# Patient Record
Sex: Female | Born: 1995 | Race: Black or African American | Hispanic: No | Marital: Single | Smoking: Never smoker
Health system: Southern US, Community
[De-identification: ages and names within clinical notes are randomized; demographics above are authoritative.]

## PROBLEM LIST (undated history)

## (undated) ENCOUNTER — Inpatient Hospital Stay (HOSPITAL_COMMUNITY): Payer: Self-pay

## (undated) DIAGNOSIS — R519 Headache, unspecified: Secondary | ICD-10-CM

## (undated) DIAGNOSIS — F419 Anxiety disorder, unspecified: Secondary | ICD-10-CM

## (undated) DIAGNOSIS — R87629 Unspecified abnormal cytological findings in specimens from vagina: Secondary | ICD-10-CM

## (undated) DIAGNOSIS — F32A Depression, unspecified: Secondary | ICD-10-CM

## (undated) DIAGNOSIS — A64 Unspecified sexually transmitted disease: Secondary | ICD-10-CM

## (undated) DIAGNOSIS — A749 Chlamydial infection, unspecified: Secondary | ICD-10-CM

## (undated) DIAGNOSIS — R7989 Other specified abnormal findings of blood chemistry: Secondary | ICD-10-CM

## (undated) DIAGNOSIS — K429 Umbilical hernia without obstruction or gangrene: Secondary | ICD-10-CM

## (undated) HISTORY — DX: Headache, unspecified: R51.9

## (undated) HISTORY — DX: Other specified abnormal findings of blood chemistry: R79.89

## (undated) HISTORY — PX: TONSILLECTOMY: SUR1361

## (undated) HISTORY — DX: Chlamydial infection, unspecified: A74.9

## (undated) HISTORY — DX: Unspecified sexually transmitted disease: A64

---
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
## 2013-02-21 ENCOUNTER — Other Ambulatory Visit: Payer: Self-pay | Admitting: Internal Medicine

## 2013-02-21 DIAGNOSIS — N6314 Unspecified lump in the right breast, lower inner quadrant: Secondary | ICD-10-CM

## 2013-02-23 ENCOUNTER — Ambulatory Visit
Admission: RE | Admit: 2013-02-23 | Discharge: 2013-02-23 | Disposition: A | Discharge status: Home / Self Care | Payer: Medicaid Other | Source: Ambulatory Visit | Attending: Internal Medicine | Admitting: Internal Medicine

## 2013-02-23 DIAGNOSIS — N6314 Unspecified lump in the right breast, lower inner quadrant: Secondary | ICD-10-CM

## 2013-12-16 ENCOUNTER — Emergency Department (HOSPITAL_COMMUNITY): Payer: Medicaid Other

## 2013-12-16 ENCOUNTER — Encounter (HOSPITAL_COMMUNITY): Payer: Self-pay | Admitting: Emergency Medicine

## 2013-12-16 ENCOUNTER — Emergency Department (HOSPITAL_COMMUNITY)
Admission: EM | Admit: 2013-12-16 | Discharge: 2013-12-17 | Disposition: A | Discharge status: Home / Self Care | Payer: Medicaid Other | Attending: Emergency Medicine | Admitting: Emergency Medicine

## 2013-12-16 DIAGNOSIS — Z791 Long term (current) use of non-steroidal anti-inflammatories (NSAID): Secondary | ICD-10-CM | POA: Diagnosis not present

## 2013-12-16 DIAGNOSIS — S0993XA Unspecified injury of face, initial encounter: Secondary | ICD-10-CM

## 2013-12-16 DIAGNOSIS — Y998 Other external cause status: Secondary | ICD-10-CM | POA: Diagnosis not present

## 2013-12-16 DIAGNOSIS — Y9289 Other specified places as the place of occurrence of the external cause: Secondary | ICD-10-CM | POA: Insufficient documentation

## 2013-12-16 DIAGNOSIS — S01511A Laceration without foreign body of lip, initial encounter: Secondary | ICD-10-CM | POA: Diagnosis not present

## 2013-12-16 DIAGNOSIS — Y9389 Activity, other specified: Secondary | ICD-10-CM | POA: Insufficient documentation

## 2013-12-16 DIAGNOSIS — Z23 Encounter for immunization: Secondary | ICD-10-CM | POA: Diagnosis not present

## 2013-12-16 DIAGNOSIS — Z8619 Personal history of other infectious and parasitic diseases: Secondary | ICD-10-CM | POA: Insufficient documentation

## 2013-12-16 DIAGNOSIS — Z792 Long term (current) use of antibiotics: Secondary | ICD-10-CM | POA: Insufficient documentation

## 2013-12-16 DIAGNOSIS — Z79899 Other long term (current) drug therapy: Secondary | ICD-10-CM | POA: Insufficient documentation

## 2013-12-16 MED ORDER — TETANUS-DIPHTH-ACELL PERTUSSIS 5-2.5-18.5 LF-MCG/0.5 IM SUSP
0.5000 mL | Freq: Once | INTRAMUSCULAR | Status: AC
  Administered 2013-12-16: 0.5 mL via INTRAMUSCULAR
  Filled 2013-12-16: qty 0.5

## 2013-12-16 MED ORDER — HYDROCODONE-ACETAMINOPHEN 5-325 MG PO TABS: 1.0000 | ORAL_TABLET | ORAL | Status: DC | PRN

## 2013-12-16 MED ORDER — PENICILLIN V POTASSIUM 500 MG PO TABS
500.0000 mg | ORAL_TABLET | Freq: Three times a day (TID) | ORAL | Status: DC
Start: 2013-12-16 — End: 2015-09-29

## 2013-12-16 MED ORDER — HYDROCODONE-ACETAMINOPHEN 5-325 MG PO TABS
1.0000 | ORAL_TABLET | Freq: Once | ORAL | Status: AC
  Administered 2013-12-16: 1 via ORAL
  Filled 2013-12-16: qty 1
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
## 2013-12-16 NOTE — Discharge Instructions (Signed)
Dental Injury  Your exam shows that you have injured your teeth. The treatment of broken teeth and other dental injuries depends on how badly they are hurt. All dental injuries should be checked as soon as possible by a dentist if there are:   Loose teeth which may need to be wired or bonded with a plastic device to hold them in place.   Broken teeth with exposed tooth pulp which may cause a serious infection.   Painful teeth especially when you bite or chew.   Sharp tooth edges that cut your tongue or lips.  Sometimes, antibiotics or pain medicine are prescribed to prevent infection and control pain. Eat a soft or liquid diet and rinse your mouth out after meals with warm water. You should see a dentist or return here at once if you have increased swelling, increased pain or uncontrolled bleeding from the site of your injury.  SEEK MEDICAL CARE IF:    You have increased pain not controlled with medicines.   You have swelling around your tooth, in your face or neck.   You have bleeding which starts, continues, or gets worse.   You have a fever.  Document Released: 12/28/2004 Document Revised: 03/22/2011 Document Reviewed: 12/27/2008  ExitCare Patient Information 2015 [employer]. This information is not intended to replace advice given to you by your health care provider. Make sure you discuss any questions you have with your health care provider.

## 2015-08-01 ENCOUNTER — Emergency Department (HOSPITAL_COMMUNITY)
Admission: EM | Admit: 2015-08-01 | Discharge: 2015-08-01 | Disposition: A | Discharge status: Home / Self Care | Payer: Medicaid Other | Attending: Emergency Medicine | Admitting: Emergency Medicine

## 2015-08-01 DIAGNOSIS — B3731 Acute candidiasis of vulva and vagina: Secondary | ICD-10-CM

## 2015-08-01 DIAGNOSIS — N760 Acute vaginitis: Secondary | ICD-10-CM | POA: Insufficient documentation

## 2015-08-01 DIAGNOSIS — B373 Candidiasis of vulva and vagina: Secondary | ICD-10-CM

## 2015-08-01 LAB — POC URINE PREG, ED: PREG TEST UR: NEGATIVE

## 2015-08-01 LAB — URINALYSIS, ROUTINE W REFLEX MICROSCOPIC
BILIRUBIN URINE: NEGATIVE
Glucose, UA: NEGATIVE mg/dL
HGB URINE DIPSTICK: NEGATIVE
KETONES UR: NEGATIVE mg/dL
NITRITE: NEGATIVE
PROTEIN: NEGATIVE mg/dL
SPECIFIC GRAVITY, URINE: 1.03 (ref 1.005–1.030)
pH: 6 (ref 5.0–8.0)

## 2015-08-01 LAB — WET PREP, GENITAL
CLUE CELLS WET PREP: NONE SEEN
Sperm: NONE SEEN
TRICH WET PREP: NONE SEEN

## 2015-08-01 LAB — URINE MICROSCOPIC-ADD ON

## 2015-08-01 LAB — GC/CHLAMYDIA PROBE AMP (~~LOC~~) NOT AT ARMC
CHLAMYDIA, DNA PROBE: NEGATIVE
Neisseria Gonorrhea: POSITIVE — AB

## 2015-08-01 MED ORDER — FLUCONAZOLE 100 MG PO TABS
150.0000 mg | ORAL_TABLET | Freq: Once | ORAL | Status: AC
  Administered 2015-08-01: 150 mg via ORAL
  Filled 2015-08-01: qty 2

## 2015-08-01 NOTE — ED Provider Notes (Signed)
TIME SEEN: 3:21 AM  CHIEF COMPLAINT: Vaginal Discharge  HPI: Jean Solis is a 20 y.o. female with PMHx of chlamydia who presents to the Emergency Department complaining of sudden onset, intermittent, copious, vaginal discharge beginning three days ago. She endorses associated vaginal burning and itching. Pt reports that she went to the health department three days ago and was treated for gonorrhea since her partner was recently diagnosed gonorrhea positive; she reports she had a urinalysis taken but not a pelvic exam. She also notes she had blood work taken and her results for HIV and syphilis are still pending. Pt was given azithromycin and a shot of Rocephin at the health department. Pt states she began experiencing vaginal discharge after the visit to the health department. Pt denies dysuria, hematuria, abdominal pain, nausea, vomiting, diarrhea, or any other associated symptoms. States she thinks she has a yeast infection.   ROS: See HPI Constitutional: no fever  Eyes: no drainage  ENT: no runny nose   Cardiovascular:  no chest pain  Resp: no SOB  GI: no nausea, no vomiting, no diarrhea, no abdominal pain GU: vaginal discharge, vaginal burning, vaginal itching, no dysuria, no hematuria Integumentary: no rash  Allergy: no hives  Musculoskeletal: no leg swelling  Neurological: no slurred speech ROS otherwise negative  PAST MEDICAL HISTORY/PAST SURGICAL HISTORY:  Past Medical History  Diagnosis Date  . Chlamydia 09/2011    MEDICATIONS:  Prior to Admission medications   Medication Sig Start Date End Date Taking? Authorizing Provider  Etonogestrel (NEXPLANON [place]) Inject 1 application into the skin once.  11/24/11   Historical Provider, MD  HYDROcodone-acetaminophen (NORCO/VICODIN) 5-325 MG per tablet Take 1-2 tablets by mouth every 4 (four) hours as needed. 12/16/13   Elpidio AnisShari Upstill, PA-C  penicillin v potassium (VEETID) 500 MG tablet Take 1 tablet (500 mg total) by mouth 3 (three)  times daily. 12/16/13   Elpidio AnisShari Upstill, PA-C    ALLERGIES:  No Known Allergies  SOCIAL HISTORY:  Social History  Substance Use Topics  . Smoking status: Never Smoker   . Smokeless tobacco: Not on file  . Alcohol Use: Yes    FAMILY HISTORY: Family History  Problem Relation Age of Onset  . Hypertension Mother   . Hyperlipidemia Mother   . Hyperlipidemia Father   . Hypertension Father   . Hypertension Maternal Grandmother     EXAM: BP 123/80 mmHg  Pulse 76  Temp(Src) 98.6 F (37 C) (Oral)  Resp 18  SpO2 100% CONSTITUTIONAL: Alert and oriented and responds appropriately to questions. Well-appearing; well-nourished HEAD: Normocephalic EYES: Conjunctivae clear, PERRL ENT: normal nose; no rhinorrhea; moist mucous membranes NECK: Supple, no meningismus, no LAD  CARD: RRR; S1 and S2 appreciated; no murmurs, no clicks, no rubs, no gallops RESP: Normal chest excursion without splinting or tachypnea; breath sounds clear and equal bilaterally; no wheezes, no rhonchi, no rales, no hypoxia or respiratory distress, speaking full sentences ABD/GI: Normal bowel sounds; non-distended; soft, non-tender, no rebound, no guarding, no peritoneal signs GU:  Normal external genitalia. No lesions, rashes noted. Patient has no vaginal bleeding on exam. Moderate amount of yellow thick vaginal discharge.  No adnexal tenderness, mass or fullness, no cervical motion tenderness. Cervix is not appear friable.  Cervix is closed.  Chaperone present for exam. BACK:  The back appears normal and is non-tender to palpation, there is no CVA tenderness EXT: Normal ROM in all joints; non-tender to palpation; no edema; normal capillary refill; no cyanosis, no calf tenderness or swelling  SKIN: Normal color for age and race; warm; no rash NEURO: Moves all extremities equally, sensation to light touch intact diffusely, cranial nerves II through XII intact PSYCH: The patient's mood and manner are appropriate. Grooming  and personal hygiene are appropriate.  MEDICAL DECISION MAKING: Patient here with vaginal discharge.  Recently treated for gonorrhea, chlamydia 3 days ago. Wet prep is positive for yeast. Will treat with Diflucan. No cervical motion tenderness, adnexal tenderness or fullness on exam. Pregnancy test negative. Doubt TOA, torsion, PID. She has STD screening test pending at the health department. Recommend she avoid sexual activity until her test results have all come back negative or if positive that both her and her partner have been treated.    At this time, I do not feel there is any life-threatening condition present. I have reviewed and discussed all results (EKG, imaging, lab, urine as appropriate), exam findings with patient. I have reviewed nursing notes and appropriate previous records.  I feel the patient is safe to be discharged home without further emergent workup. Discussed usual and customary return precautions. Patient and family (if present) verbalize understanding and are comfortable with this plan.  Patient will follow-up with their primary care provider. If they do not have a primary care provider, information for follow-up has been provided to them. All questions have been answered.   I personally performed the services described in this documentation, which was scribed in my presence. The recorded information has been reviewed and is accurate.   Layla Maw Gracelee Stemmler, DO 08/01/15 [phone]
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
## 2015-08-01 NOTE — Discharge Instructions (Signed)
Monilial Vaginitis Vaginitis in a soreness, swelling and redness (inflammation) of the vagina and vulva. Monilial vaginitis is not a sexually transmitted infection. CAUSES  Yeast vaginitis is caused by yeast (candida) that is normally found in your vagina. With a yeast infection, the candida has overgrown in number to a point that upsets the chemical balance. SYMPTOMS   White, thick vaginal discharge.  Swelling, itching, redness and irritation of the vagina and possibly the lips of the vagina (vulva).  Burning or painful urination.  Painful intercourse. DIAGNOSIS  Things that may contribute to monilial vaginitis are:  Postmenopausal and virginal states.  Pregnancy.  Infections.  Being tired, sick or stressed, especially if you had monilial vaginitis in the past.  Diabetes. Good control will help lower the chance.  Birth control pills.  Tight fitting garments.  Using bubble bath, feminine sprays, douches or deodorant tampons.  Taking certain medications that kill germs (antibiotics).  Sporadic recurrence can occur if you become ill. TREATMENT  Your caregiver will give you medication.  There are several kinds of anti monilial vaginal creams and suppositories specific for monilial vaginitis. For recurrent yeast infections, use a suppository or cream in the vagina 2 times a week, or as directed.  Anti-monilial or steroid cream for the itching or irritation of the vulva may also be used. Get your caregiver's permission.  Painting the vagina with methylene blue solution may help if the monilial cream does not work.  Eating yogurt may help prevent monilial vaginitis. HOME CARE INSTRUCTIONS   Finish all medication as prescribed.  Do not have sex until treatment is completed or after your caregiver tells you it is okay.  Take warm sitz baths.  Do not douche.  Do not use tampons, especially scented ones.  Wear cotton underwear.  Avoid tight pants and panty  hose.  Tell your sexual partner that you have a yeast infection. They should go to their caregiver if they have symptoms such as mild rash or itching.  Your sexual partner should be treated as well if your infection is difficult to eliminate.  Practice safer sex. Use condoms.  Some vaginal medications cause latex condoms to fail. Vaginal medications that harm condoms are:  Cleocin cream.  Butoconazole (Femstat).  Terconazole (Terazol) vaginal suppository.  Miconazole (Monistat) (may be purchased over the counter). SEEK MEDICAL CARE IF:   You have a temperature by mouth above 102 F (38.9 C).  The infection is getting worse after 2 days of treatment.  The infection is not getting better after 3 days of treatment.  You develop blisters in or around your vagina.  You develop vaginal bleeding, and it is not your menstrual period.  You have pain when you urinate.  You develop intestinal problems.  You have pain with sexual intercourse.   This information is not intended to replace advice given to you by your health care provider. Make sure you discuss any questions you have with your health care provider.   Document Released: 10/07/2004 Document Revised: 03/22/2011 Document Reviewed: 07/01/2014 Elsevier Interactive Patient Education [employer]2016 Elsevier [employer]   To find a primary care or specialty doctor please call [phone] or [phone] to access "[facility] Find a Doctor Service."  You may also go on the [facility] website at [email or web address]  There are also multiple [facility] and [facility] practices throughout the [place] that are frequently accepting new patients. You may find a clinic that is close to your home and contact them.  [facility] -  [address] [place] [address] [phone]  [facility] [place] (also locations in [place] and [place]) -  [address] [place] [address] [phone]  [facility] -  [address] [place] [address] [phone]     [email or web address] [address] [place], [place] [phone]    [facility] [email or web address] [address] [place], [place] [phone]    [facility] [address] [place], [place] [phone]   [facility] [email or web address] [address] [place], [place] [phone]   [facility] [email or web address] [address] [place], [place] [phone]   [facility] [email or web address] [address] [place], [place] [phone]

## 2015-08-04 ENCOUNTER — Telehealth (HOSPITAL_BASED_OUTPATIENT_CLINIC_OR_DEPARTMENT_OTHER): Payer: Self-pay | Admitting: Emergency Medicine

## 2015-08-04 NOTE — Telephone Encounter (Signed)
Chart handoff to EDP for treatment plan for +GC 

## 2015-08-05 ENCOUNTER — Telehealth (HOSPITAL_COMMUNITY): Payer: Self-pay

## 2015-09-29 ENCOUNTER — Emergency Department (HOSPITAL_COMMUNITY)
Admission: EM | Admit: 2015-09-29 | Discharge: 2015-09-29 | Disposition: A | Discharge status: Home / Self Care | Payer: 59 | Attending: Emergency Medicine | Admitting: Emergency Medicine

## 2015-09-29 ENCOUNTER — Emergency Department (HOSPITAL_COMMUNITY): Payer: 59

## 2015-09-29 ENCOUNTER — Emergency Department (HOSPITAL_COMMUNITY)
Admission: EM | Admit: 2015-09-29 | Discharge: 2015-09-29 | Discharge status: Against Medical Advice | Payer: Managed Care, Other (non HMO)

## 2015-09-29 ENCOUNTER — Encounter (HOSPITAL_COMMUNITY): Payer: Self-pay | Admitting: Emergency Medicine

## 2015-09-29 DIAGNOSIS — R112 Nausea with vomiting, unspecified: Secondary | ICD-10-CM | POA: Diagnosis present

## 2015-09-29 DIAGNOSIS — R102 Pelvic and perineal pain unspecified side: Secondary | ICD-10-CM

## 2015-09-29 DIAGNOSIS — N730 Acute parametritis and pelvic cellulitis: Secondary | ICD-10-CM | POA: Diagnosis not present

## 2015-09-29 DIAGNOSIS — R1030 Lower abdominal pain, unspecified: Secondary | ICD-10-CM

## 2015-09-29 LAB — URINALYSIS, ROUTINE W REFLEX MICROSCOPIC
BILIRUBIN URINE: NEGATIVE
Glucose, UA: NEGATIVE mg/dL
Hgb urine dipstick: NEGATIVE
KETONES UR: 15 mg/dL — AB
Leukocytes, UA: NEGATIVE
NITRITE: NEGATIVE
SPECIFIC GRAVITY, URINE: 1.034 — AB (ref 1.005–1.030)
pH: 6 (ref 5.0–8.0)

## 2015-09-29 LAB — CBC
HCT: 41.4 % (ref 36.0–46.0)
HEMOGLOBIN: 13.4 g/dL (ref 12.0–15.0)
MCH: 26.9 pg (ref 26.0–34.0)
MCHC: 32.4 g/dL (ref 30.0–36.0)
MCV: 83 fL (ref 78.0–100.0)
PLATELETS: 362 10*3/uL (ref 150–400)
RBC: 4.99 MIL/uL (ref 3.87–5.11)
RDW: 14.2 % (ref 11.5–15.5)
WBC: 8.8 10*3/uL (ref 4.0–10.5)

## 2015-09-29 LAB — WET PREP, GENITAL
SPERM: NONE SEEN
TRICH WET PREP: NONE SEEN
YEAST WET PREP: NONE SEEN

## 2015-09-29 LAB — COMPREHENSIVE METABOLIC PANEL
ALBUMIN: 4.5 g/dL (ref 3.5–5.0)
ALK PHOS: 87 U/L (ref 38–126)
ALT: 14 U/L (ref 14–54)
ANION GAP: 10 (ref 5–15)
AST: 22 U/L (ref 15–41)
BILIRUBIN TOTAL: 0.6 mg/dL (ref 0.3–1.2)
BUN: 8 mg/dL (ref 6–20)
CALCIUM: 9.8 mg/dL (ref 8.9–10.3)
CO2: 25 mmol/L (ref 22–32)
CREATININE: 0.9 mg/dL (ref 0.44–1.00)
Chloride: 105 mmol/L (ref 101–111)
GFR calc Af Amer: >60 mL/min (ref >60)
GFR calc non Af Amer: >60 mL/min (ref >60)
GLUCOSE: 110 mg/dL — AB (ref 65–99)
Potassium: 3.5 mmol/L (ref 3.5–5.1)
Sodium: 140 mmol/L (ref 135–145)
TOTAL PROTEIN: 7.9 g/dL (ref 6.5–8.1)

## 2015-09-29 LAB — URINE MICROSCOPIC-ADD ON
RBC / HPF: NONE SEEN RBC/hpf (ref 0–5)
WBC, UA: NONE SEEN WBC/hpf (ref 0–5)

## 2015-09-29 LAB — LIPASE, BLOOD: Lipase: 22 U/L (ref 11–51)

## 2015-09-29 LAB — POC URINE PREG, ED: PREG TEST UR: NEGATIVE

## 2015-09-29 MED ORDER — ONDANSETRON 4 MG PO TBDP: ORAL_TABLET | ORAL | 0 refills | Status: DC

## 2015-09-29 MED ORDER — OXYCODONE-ACETAMINOPHEN 5-325 MG PO TABS
2.0000 | ORAL_TABLET | Freq: Once | ORAL | Status: AC
  Administered 2015-09-29: 2 via ORAL
  Filled 2015-09-29: qty 2

## 2015-09-29 MED ORDER — LIDOCAINE HCL (PF) 1 % IJ SOLN
INTRAMUSCULAR | Status: AC
  Administered 2015-09-29: 5 mL
  Filled 2015-09-29: qty 5

## 2015-09-29 MED ORDER — ONDANSETRON HCL 4 MG/2ML IJ SOLN
4.0000 mg | Freq: Once | INTRAMUSCULAR | Status: AC
  Administered 2015-09-29: 4 mg via INTRAVENOUS
  Filled 2015-09-29: qty 2

## 2015-09-29 MED ORDER — DOXYCYCLINE HYCLATE 100 MG PO CAPS: 100.0000 mg | ORAL_CAPSULE | Freq: Two times a day (BID) | ORAL | 0 refills | Status: DC

## 2015-09-29 MED ORDER — AZITHROMYCIN 250 MG PO TABS
1000.0000 mg | ORAL_TABLET | Freq: Once | ORAL | Status: AC
  Administered 2015-09-29: 1000 mg via ORAL
  Filled 2015-09-29: qty 4

## 2015-09-29 MED ORDER — SODIUM CHLORIDE 0.9 % IV BOLUS (SEPSIS)
2000.0000 mL | INTRAVENOUS | Status: AC
  Administered 2015-09-29: 2000 mL via INTRAVENOUS

## 2015-09-29 MED ORDER — CEFTRIAXONE SODIUM 250 MG IJ SOLR
250.0000 mg | Freq: Once | INTRAMUSCULAR | Status: AC
  Administered 2015-09-29: 250 mg via INTRAMUSCULAR
  Filled 2015-09-29: qty 250

## 2015-09-29 MED ORDER — DOXYCYCLINE HYCLATE 100 MG PO TABS
100.0000 mg | ORAL_TABLET | Freq: Once | ORAL | Status: AC
  Administered 2015-09-29: 100 mg via ORAL
  Filled 2015-09-29: qty 1

## 2015-09-29 MED ORDER — FLUCONAZOLE 150 MG PO TABS: ORAL_TABLET | ORAL | 0 refills | Status: DC

## 2015-09-29 NOTE — Discharge Instructions (Signed)
1. Medications: Doxycycline, usual home medications °2. Treatment: rest, drink plenty of fluids, use a condom with every sexual encounter °3. Follow Up: Please followup with your primary doctor in 3 days for discussion of your diagnoses and further evaluation after today's visit; if you do not have a primary care doctor use the resource guide provided to find one; Please return to the ER for worsening symptoms, high fevers or persistent vomiting. ° °You have been tested for chlamydia and gonorrhea.  These results will be available in approximately 3 days.  Please inform all sexual partners if you test positive for any of these diseases. ° °

## 2015-09-29 NOTE — ED Notes (Signed)
Pt leaving department ambulatory without difficulty. Taken home by mother. No questions about discharge instructions. Verbalizes understanding.

## 2015-09-29 NOTE — ED Provider Notes (Signed)
[facility]-EMERGENCY DEPT Provider Note   CSN: [id number] Arrival date & time: 09/29/15  0200     History   Chief Complaint Chief Complaint  Patient presents with  . Abdominal Pain    HPI Jean Solis is a 20 y.o. female with a hx of Chlamydia presents to the Emergency Department complaining of gradual, persistent, progressively worsening lower abdominal pain worst in the left lower quadrant onset 2 days ago. Associated symptoms include nausea without vomiting and several episodes of loose stool but none today. No melena or hematochezia. Patient denies international travel.  She is sexually active with use of nexplanon.  Mild vaginal discharge. Denies abdominal surgeries. Patient denies sick contacts.. No aggravating or alleviating factors.  The history is provided by the patient and medical records. No language interpreter was used.    Past Medical History:  Diagnosis Date  . Chlamydia 09/2011    Patient Active Problem List   Diagnosis Date Noted  . Eczema 12/27/2011  . Acne 12/27/2011  . High risk heterosexual behavior 12/27/2011    Past Surgical History:  Procedure Laterality Date  . TONSILLECTOMY      OB History    No data available       Home Medications    Prior to Admission medications   Medication Sig Start Date End Date Taking? Authorizing Provider  Etonogestrel (NEXPLANON [place]) Inject 1 application into the skin once.  11/24/11  Yes Historical Provider, MD  doxycycline (VIBRAMYCIN) 100 MG capsule Take 1 capsule (100 mg total) by mouth 2 (two) times daily. 09/29/15   Keishia Ground, PA-C  ondansetron (ZOFRAN ODT) 4 MG disintegrating tablet 4mg  ODT q4 hours prn nausea/vomit 09/29/15   Dahlia Client Sereena Marando, PA-C    Family History Family History  Problem Relation Age of Onset  . Hypertension Mother   . Hyperlipidemia Mother   . Hyperlipidemia Father   . Hypertension Father   . Hypertension Maternal Grandmother     Social History Social History    Substance Use Topics  . Smoking status: Never Smoker  . Smokeless tobacco: Never Used  . Alcohol use Yes     Allergies   Review of patient's allergies indicates no known allergies.   Review of Systems Review of Systems  Constitutional: Negative for fatigue and fever.  HENT: Negative for congestion and sore throat.   Respiratory: Negative for shortness of breath.   Cardiovascular: Negative for chest pain and leg swelling.  Gastrointestinal: Positive for abdominal pain, diarrhea and nausea.  Genitourinary: Positive for urgency. Negative for dysuria, vaginal discharge and vaginal pain.  Musculoskeletal: Negative for back pain.  Skin: Negative for rash.  All other systems reviewed and are negative.    Physical Exam Updated Vital Signs BP 128/75 (BP Location: Right Arm)   Pulse 102   Temp 98.6 F (37 C) (Oral)   Resp 18   Ht 5\' 5"  (1.651 m)   Wt 91.6 kg   SpO2 100%   BMI 33.61 kg/m   Physical Exam  Constitutional: She appears well-developed and well-nourished. No distress.  Awake, alert, nontoxic appearance  HENT:  Head: Normocephalic and atraumatic.  Mouth/Throat: Oropharynx is clear and moist. No oropharyngeal exudate.  Eyes: Conjunctivae are normal. No scleral icterus.  Neck: Normal range of motion. Neck supple.  Cardiovascular: Normal rate, regular rhythm, normal heart sounds and intact distal pulses.   No murmur heard. Pulmonary/Chest: Effort normal and breath sounds normal. No respiratory distress. She has no wheezes.  Equal chest expansion  Abdominal: Soft.  Bowel sounds are normal. She exhibits no distension and no mass. There is tenderness in the suprapubic area and left lower quadrant. There is no rebound and no guarding. Hernia confirmed negative in the right inguinal area and confirmed negative in the left inguinal area.  Genitourinary: Uterus normal. No labial fusion. There is no rash, tenderness or lesion on the right labia. There is no rash, tenderness or  lesion on the left labia. Uterus is not deviated, not enlarged, not fixed and not tender. Cervix exhibits no motion tenderness, no discharge and no friability. Right adnexum displays no mass, no tenderness and no fullness. Left adnexum displays tenderness ( mild). Left adnexum displays no mass and no fullness. No erythema, tenderness or bleeding in the vagina. No foreign body in the vagina. No signs of injury around the vagina. Vaginal discharge (Thick, brown, small) found.  Musculoskeletal: Normal range of motion. She exhibits no edema.  Lymphadenopathy:       Right: No inguinal adenopathy present.       Left: No inguinal adenopathy present.  Neurological: She is alert.  Speech is clear and goal oriented Moves extremities without ataxia  Skin: Skin is warm and dry. She is not diaphoretic. No erythema.  Psychiatric: She has a normal mood and affect.  Nursing note and vitals reviewed.    ED Treatments / Results  Labs (all labs ordered are listed, but only abnormal results are displayed) Labs Reviewed  WET PREP, GENITAL - Abnormal; Notable for the following:       Result Value   Clue Cells Wet Prep HPF POC PRESENT (*)    WBC, Wet Prep HPF POC MANY (*)    All other components within normal limits  COMPREHENSIVE METABOLIC PANEL - Abnormal; Notable for the following:    Glucose, Bld 110 (*)    All other components within normal limits  URINALYSIS, ROUTINE W REFLEX MICROSCOPIC (NOT AT [facility]) - Abnormal; Notable for the following:    Color, Urine AMBER (*)    APPearance CLOUDY (*)    Specific Gravity, Urine 1.034 (*)    Ketones, ur 15 (*)    Protein, ur >300 (*)    All other components within normal limits  URINE MICROSCOPIC-ADD ON - Abnormal; Notable for the following:    Squamous Epithelial / LPF 0-5 (*)    Bacteria, UA RARE (*)    Casts HYALINE CASTS (*)    All other components within normal limits  LIPASE, BLOOD  CBC  POC URINE PREG, ED  GC/CHLAMYDIA PROBE AMP ([place]) NOT  AT [facility]    Radiology US Transvaginal Non-ob  Result Date: 09/29/2015 CLINICAL DATA:  Pain. EXAM: TRANSABDOMINAL AND TRANSVAGINAL ULTRASOUND OF PELVIS DOPPLER ULTRASOUND OF OVARIES TECHNIQUE: Both transabdominal and transvaginal ultrasound examinations of the pelvis were performed. Transabdominal technique was performed for global imaging of the pelvis including uterus, ovaries, adnexal regions, and pelvic cul-de-sac. It was necessary to proceed with endovaginal exam following the transabdominal exam to visualize the uterus and over. Color and duplex Doppler ultrasound was utilized to evaluate blood flow to the ovaries. COMPARISON:  No recent prior. FINDINGS: Uterus Measurements: 7.0 x 2.4 x 3.5 cm. No fibroids or other mass visualized. Small amount intrauterine fluid noted along the lower uterine segment. Endometrium Thickness: 3.5 mm.  No focal abnormality visualized. Right ovary Measurements: 3.0 x 1.9 x 1.8 cm. Normal appearance/no adnexal mass. Left ovary Measurements: 3.1 x 2.3 x 2.5 cm. Normal appearance/no adnexal mass. Pulsed Doppler evaluation of both ovaries  demonstrates normal low-resistance arterial and venous waveforms. Other findings No abnormal free fluid. IMPRESSION: 1. Small amount of intrauterine fluid noted along the lower uterus segment, this is nonspecific. 2. Exam is otherwise unremarkable. No focal ovarian abnormality identified. No evidence of torsion. Electronically Signed   By: Maisie Fushomas  Register   On: 09/29/2015 07:29   [place] Pelvis Complete  Result Date: 09/29/2015 CLINICAL DATA:  Pain. EXAM: TRANSABDOMINAL AND TRANSVAGINAL ULTRASOUND OF PELVIS DOPPLER ULTRASOUND OF OVARIES TECHNIQUE: Both transabdominal and transvaginal ultrasound examinations of the pelvis were performed. Transabdominal technique was performed for global imaging of the pelvis including uterus, ovaries, adnexal regions, and pelvic cul-de-sac. It was necessary to proceed with endovaginal exam following the  transabdominal exam to visualize the uterus and over. Color and duplex Doppler ultrasound was utilized to evaluate blood flow to the ovaries. COMPARISON:  No recent prior. FINDINGS: Uterus Measurements: 7.0 x 2.4 x 3.5 cm. No fibroids or other mass visualized. Small amount intrauterine fluid noted along the lower uterine segment. Endometrium Thickness: 3.5 mm.  No focal abnormality visualized. Right ovary Measurements: 3.0 x 1.9 x 1.8 cm. Normal appearance/no adnexal mass. Left ovary Measurements: 3.1 x 2.3 x 2.5 cm. Normal appearance/no adnexal mass. Pulsed Doppler evaluation of both ovaries demonstrates normal low-resistance arterial and venous waveforms. Other findings No abnormal free fluid. IMPRESSION: 1. Small amount of intrauterine fluid noted along the lower uterus segment, this is nonspecific. 2. Exam is otherwise unremarkable. No focal ovarian abnormality identified. No evidence of torsion. Electronically Signed   By: Maisie Fushomas  Register   On: 09/29/2015 07:29   [place] Art/ven Flow Abd Pelv Doppler  Result Date: 09/29/2015 CLINICAL DATA:  Pain. EXAM: TRANSABDOMINAL AND TRANSVAGINAL ULTRASOUND OF PELVIS DOPPLER ULTRASOUND OF OVARIES TECHNIQUE: Both transabdominal and transvaginal ultrasound examinations of the pelvis were performed. Transabdominal technique was performed for global imaging of the pelvis including uterus, ovaries, adnexal regions, and pelvic cul-de-sac. It was necessary to proceed with endovaginal exam following the transabdominal exam to visualize the uterus and over. Color and duplex Doppler ultrasound was utilized to evaluate blood flow to the ovaries. COMPARISON:  No recent prior. FINDINGS: Uterus Measurements: 7.0 x 2.4 x 3.5 cm. No fibroids or other mass visualized. Small amount intrauterine fluid noted along the lower uterine segment. Endometrium Thickness: 3.5 mm.  No focal abnormality visualized. Right ovary Measurements: 3.0 x 1.9 x 1.8 cm. Normal appearance/no adnexal mass. Left  ovary Measurements: 3.1 x 2.3 x 2.5 cm. Normal appearance/no adnexal mass. Pulsed Doppler evaluation of both ovaries demonstrates normal low-resistance arterial and venous waveforms. Other findings No abnormal free fluid. IMPRESSION: 1. Small amount of intrauterine fluid noted along the lower uterus segment, this is nonspecific. 2. Exam is otherwise unremarkable. No focal ovarian abnormality identified. No evidence of torsion. Electronically Signed   By: Maisie Fushomas  Register   On: 09/29/2015 07:29    Procedures Procedures (including critical care time)  Medications Ordered in ED Medications  azithromycin (ZITHROMAX) tablet 1,000 mg (not administered)  cefTRIAXone (ROCEPHIN) injection 250 mg (not administered)  doxycycline (VIBRA-TABS) tablet 100 mg (not administered)  oxyCODONE-acetaminophen (PERCOCET/ROXICET) 5-325 MG per tablet 2 tablet (2 tablets Oral Given 09/29/15 0458)  sodium chloride 0.9 % bolus 2,000 mL (0 mLs Intravenous Stopped 09/29/15 0705)  ondansetron (ZOFRAN) injection 4 mg (4 mg Intravenous Given 09/29/15 0608)     Initial Impression / Assessment and Plan / ED Course  I have reviewed the triage vital signs and the nursing notes.  Pertinent labs & imaging results that were  available during my care of the patient were reviewed by me and considered in my medical decision making (see chart for details).  Clinical Course  Value Comment By Time  BP: 128/75 Vital signs stable. Tachycardia noted at triage and on repeat vitals however none upon my clinical assessment. Dahlia Client Kairi Harshbarger, PA-C 09/18 0421  WBC: 8.8 No leukocytosis Dierdre Forth, PA-C 09/18 0422  Specific Gravity, Urine: (!) 1.034 Concentrated urine but no evidence of UTI. Dahlia Client Zerrick Hanssen, PA-C 09/18 0422  Protein: (!) >300 Protein noted. Patient without flank pain or urinary symptoms. Dahlia Client Trennon Torbeck, PA-C 09/18 0422  Sodium: 140 Electrolytes reassuring. No anion gap. Dahlia Client Trella Thurmond, PA-C 09/18 0422    Pt. With lower abdominal pain, nausea and vomiting. No cervical motion tenderness but mild left adnexal tenderness. No ovarian torsion or tubo-ovarian abscess on ultrasound. Patient will be treated with azithromycin, Rocephin and doxycycline. She is well appearing without her emesis here in the emergency department. Discussed reasons to return to the emergency room including persistent vomiting and high fevers.  Patient is in agreement with the plan.  Final Clinical Impressions(s) / ED Diagnoses   Final diagnoses:  Pelvic pain in female  Lower abdominal pain  Non-intractable vomiting with nausea, vomiting of unspecified type  PID (acute pelvic inflammatory disease)    New Prescriptions New Prescriptions   DOXYCYCLINE (VIBRAMYCIN) 100 MG CAPSULE    Take 1 capsule (100 mg total) by mouth 2 (two) times daily.   ONDANSETRON (ZOFRAN ODT) 4 MG DISINTEGRATING TABLET    4mg  ODT q4 hours prn nausea/vomit     Dierdre Forth, PA-C 09/29/15 0751    Layla Maw Ward, DO 09/30/15 1610

## 2015-09-29 NOTE — ED Notes (Signed)
No answer in waiting room.  Tech first states she believes pt left.

## 2015-09-29 NOTE — ED Triage Notes (Signed)
C/o sharp LUQ pain and nausea x 2 days.  States she initially had diarrhea but it resolved.

## 2015-09-30 LAB — GC/CHLAMYDIA PROBE AMP (~~LOC~~) NOT AT ARMC
Chlamydia: NEGATIVE
NEISSERIA GONORRHEA: NEGATIVE

## 2016-09-20 ENCOUNTER — Encounter (HOSPITAL_COMMUNITY): Payer: Self-pay | Admitting: Emergency Medicine

## 2016-09-20 DIAGNOSIS — Z5321 Procedure and treatment not carried out due to patient leaving prior to being seen by health care provider: Secondary | ICD-10-CM | POA: Insufficient documentation

## 2016-09-20 LAB — POC URINE PREG, ED: Preg Test, Ur: NEGATIVE

## 2016-09-20 NOTE — ED Triage Notes (Signed)
Pt comes with wishes to get a pregnancy test.  LMP approximately one month ago. Had Nexplanon at that time but it has since been removed. Endorses nausea without vomiting.  Ambulatory and A&O x4.

## 2016-09-21 ENCOUNTER — Emergency Department (HOSPITAL_COMMUNITY)
Admission: EM | Admit: 2016-09-21 | Discharge: 2016-09-21 | Discharge status: Against Medical Advice | Payer: 59 | Attending: Emergency Medicine | Admitting: Emergency Medicine

## 2016-09-21 NOTE — ED Notes (Signed)
Called pt's name to be triaged, but no response from the waiting room. 

## 2017-01-11 NOTE — L&D Delivery Note (Signed)
OB/GYN Faculty Practice Delivery Note  Jean Solis is a 22 y.o. G1P0000 s/p SVD at [redacted]w[redacted]d. She was admitted for PROM.   ROM: 14h 66m with reported clear fluid - though meconium noted at time of delivery GBS Status: negative Maximum Maternal Temperature: Temp (24hrs), Avg:97.8 F (36.6 C), Min:97.6 F (36.4 C), Max:98.1 F (36.7 C)  Labor Progress: . Admitted with PROM, early labor . Started with waterbirth but pain difficult to control . Started pitocin for PROM, unchanged exam . Epidural placed  Delivery Date/Time: 10/11/17 at 0600 Delivery: Called to room and patient was complete and pushing. Head delivered LOA. No nuchal cord present. Shoulder and body delivered in usual fashion. Infant with spontaneous cry, placed on mother's abdomen, dried and stimulated. Cord clamped x 2 after 1-minute delay, and cut by father of baby. Cord blood drawn. Placenta delivered spontaneously with gentle cord traction. Fundus firm with massage and Pitocin. Labia, perineum, vagina, and cervix inspected inspected with left labial laceration.   Placenta: spontaneous, intact, 3-vessel cord Complications: deep variables with epidural placement and in active labor but resolved with position changes Lacerations: left labial repaired with 4-0 rapide EBL: 100cc  Postpartum Planning [x]  message to sent to schedule follow-up  [x]  vaccines UTD  Infant: Vigorous female  APGARs 8, 9  weight pending  Kentaro Alewine S. Earlene Plater, DO OB/GYN Fellow, Faculty Practice

## 2017-02-08 ENCOUNTER — Inpatient Hospital Stay (HOSPITAL_COMMUNITY)
Admission: EM | Admit: 2017-02-08 | Discharge: 2017-02-08 | Disposition: A | Discharge status: Home / Self Care | Payer: Medicaid Other | Source: Ambulatory Visit | Attending: Obstetrics and Gynecology | Admitting: Obstetrics and Gynecology

## 2017-02-08 ENCOUNTER — Inpatient Hospital Stay (HOSPITAL_COMMUNITY): Payer: Medicaid Other

## 2017-02-08 ENCOUNTER — Encounter (HOSPITAL_COMMUNITY): Payer: Self-pay | Admitting: *Deleted

## 2017-02-08 DIAGNOSIS — R102 Pelvic and perineal pain: Secondary | ICD-10-CM

## 2017-02-08 DIAGNOSIS — R109 Unspecified abdominal pain: Secondary | ICD-10-CM

## 2017-02-08 DIAGNOSIS — O26899 Other specified pregnancy related conditions, unspecified trimester: Secondary | ICD-10-CM

## 2017-02-08 DIAGNOSIS — O26891 Other specified pregnancy related conditions, first trimester: Secondary | ICD-10-CM | POA: Diagnosis not present

## 2017-02-08 DIAGNOSIS — Z3A01 Less than 8 weeks gestation of pregnancy: Secondary | ICD-10-CM | POA: Diagnosis not present

## 2017-02-08 DIAGNOSIS — R1030 Lower abdominal pain, unspecified: Secondary | ICD-10-CM | POA: Insufficient documentation

## 2017-02-08 DIAGNOSIS — O3680X0 Pregnancy with inconclusive fetal viability, not applicable or unspecified: Secondary | ICD-10-CM

## 2017-02-08 LAB — URINALYSIS, ROUTINE W REFLEX MICROSCOPIC
Bilirubin Urine: NEGATIVE
GLUCOSE, UA: NEGATIVE mg/dL
HGB URINE DIPSTICK: NEGATIVE
Ketones, ur: NEGATIVE mg/dL
Leukocytes, UA: NEGATIVE
Nitrite: NEGATIVE
PROTEIN: NEGATIVE mg/dL
Specific Gravity, Urine: 1.024 (ref 1.005–1.030)
pH: 6 (ref 5.0–8.0)

## 2017-02-08 LAB — CBC
HEMATOCRIT: 35.5 % — AB (ref 36.0–46.0)
HEMOGLOBIN: 11.9 g/dL — AB (ref 12.0–15.0)
MCH: 27.5 pg (ref 26.0–34.0)
MCHC: 33.5 g/dL (ref 30.0–36.0)
MCV: 82 fL (ref 78.0–100.0)
Platelets: 306 10*3/uL (ref 150–400)
RBC: 4.33 MIL/uL (ref 3.87–5.11)
RDW: 14.1 % (ref 11.5–15.5)
WBC: 9 10*3/uL (ref 4.0–10.5)

## 2017-02-08 LAB — WET PREP, GENITAL
Clue Cells Wet Prep HPF POC: NONE SEEN
Sperm: NONE SEEN
TRICH WET PREP: NONE SEEN
YEAST WET PREP: NONE SEEN

## 2017-02-08 LAB — POCT PREGNANCY, URINE: Preg Test, Ur: POSITIVE — AB

## 2017-02-08 LAB — HCG, QUANTITATIVE, PREGNANCY: hCG, Beta Chain, Quant, S: 1944 m[IU]/mL — ABNORMAL HIGH (ref <5)

## 2017-02-08 NOTE — MAU Note (Signed)
PT SAYS SHE HAS BEEN CRAMPING X 1 WEEK.  DID HPT  YESTERDAY- POSITIVE .   LAST SEX-  THIS MTH.

## 2017-02-08 NOTE — Discharge Instructions (Signed)
Pain in Early Pregnancy  Pain can be normal inearly pregnancy or lead to miscarriage.    Your doctor will do tests to make sure you are still pregnant. The cause of the pain may not be known. This condition does not mean your pregnancy will end. Follow these instructions at home:  Make sure you keep all your doctor visits for prenatal care.  Get plenty of rest.  Do not have sex or use tampons if you have vaginal bleeding.  Do not douche.  Do not smoke or use drugs.  Do not drink alcohol.  Avoid caffeine. Contact a doctor if:  You have light bleeding from your vagina.  You have belly pain or cramping.  You have a fever. Get help right away if:  You have heavy bleeding from your vagina.  You have clots of blood coming from your vagina.  You have bad pain or cramps in your low back or belly.  You have fever, chills, and bad belly pain. This information is not intended to replace advice given to you by your health care provider. Make sure you discuss any questions you have with your health care provider. Document Released: 12/11/2007 Document Revised: 06/05/2015 Document Reviewed: 10/24/2012 Elsevier Interactive Patient Education  2018 [employer].  First Trimester of Pregnancy The first trimester of pregnancy is from week 1 until the end of week 13 (months 1 through 3). During this time, your baby will begin to develop inside you. At 6-8 weeks, the eyes and face are formed, and the heartbeat can be seen on ultrasound. At the end of 12 weeks, all the baby's organs are formed. Prenatal care is all the medical care you receive before the birth of your baby. Make sure you get good prenatal care and follow all of your doctor's instructions. Follow these instructions at home: Medicines  Take over-the-counter and prescription medicines only as told by your doctor. Some medicines are safe and some medicines are not safe during pregnancy.  Take a prenatal vitamin that contains at  least 600 micrograms (mcg) of folic acid.  If you have trouble pooping (constipation), take medicine that will make your stool soft (stool softener) if your doctor approves. Eating and drinking  Eat regular, healthy meals.  Your doctor will tell you the amount of weight gain that is right for you.  Avoid raw meat and uncooked cheese.  If you feel sick to your stomach (nauseous) or throw up (vomit): ? Eat 4 or 5 small meals a day instead of 3 large meals. ? Try eating a few soda crackers. ? Drink liquids between meals instead of during meals.  To prevent constipation: ? Eat foods that are high in fiber, like fresh fruits and vegetables, whole grains, and beans. ? Drink enough fluids to keep your pee (urine) clear or pale yellow. Activity  Exercise only as told by your doctor. Stop exercising if you have cramps or pain in your lower belly (abdomen) or low back.  Do not exercise if it is too hot, too humid, or if you are in a place of great height (high altitude).  Try to avoid standing for long periods of time. Move your legs often if you must stand in one place for a long time.  Avoid heavy lifting.  Wear low-heeled shoes. Sit and stand up straight.  You can have sex unless your doctor tells you not to. Relieving pain and discomfort  Wear a good support bra if your breasts are sore.  Take  warm water baths (sitz baths) to soothe pain or discomfort caused by hemorrhoids. Use hemorrhoid cream if your doctor says it is okay.  Rest with your legs raised if you have leg cramps or low back pain.  If you have puffy, bulging veins (varicose veins) in your legs: ? Wear support hose or compression stockings as told by your doctor. ? Raise (elevate) your feet for 15 minutes, 3-4 times a day. ? Limit salt in your food. Prenatal care  Schedule your prenatal visits by the twelfth week of pregnancy.  Write down your questions. Take them to your prenatal visits.  Keep all your  prenatal visits as told by your doctor. This is important. Safety  Wear your seat belt at all times when driving.  Make a list of emergency phone numbers. The list should include numbers for family, friends, the hospital, and police and fire departments. General instructions  Ask your doctor for a referral to a local prenatal class. Begin classes no later than at the start of month 6 of your pregnancy.  Ask for help if you need counseling or if you need help with nutrition. Your doctor can give you advice or tell you where to go for help.  Do not use hot tubs, steam rooms, or saunas.  Do not douche or use tampons or scented sanitary pads.  Do not cross your legs for long periods of time.  Avoid all herbs and alcohol. Avoid drugs that are not approved by your doctor.  Do not use any tobacco products, including cigarettes, chewing tobacco, and electronic cigarettes. If you need help quitting, ask your doctor. You may get counseling or other support to help you quit.  Avoid cat litter boxes and soil used by cats. These carry germs that can cause birth defects in the baby and can cause a loss of your baby (miscarriage) or stillbirth.  Visit your dentist. At home, brush your teeth with a soft toothbrush. Be gentle when you floss. Contact a doctor if:  You are dizzy.  You have mild cramps or pressure in your lower belly.  You have a nagging pain in your belly area.  You continue to feel sick to your stomach, you throw up, or you have watery poop (diarrhea).  You have a bad smelling fluid coming from your vagina.  You have pain when you pee (urinate).  You have increased puffiness (swelling) in your face, hands, legs, or ankles. Get help right away if:  You have a fever.  You are leaking fluid from your vagina.  You have spotting or bleeding from your vagina.  You have very bad belly cramping or pain.  You gain or lose weight rapidly.  You throw up blood. It may look like  coffee grounds.  You are around people who have [place] measles, fifth disease, or chickenpox.  You have a very bad headache.  You have shortness of breath.  You have any kind of trauma, such as from a fall or a car accident. Summary  The first trimester of pregnancy is from week 1 until the end of week 13 (months 1 through 3).  To take care of yourself and your unborn baby, you will need to eat healthy meals, take medicines only if your doctor tells you to do so, and do activities that are safe for you and your baby.  Keep all follow-up visits as told by your doctor. This is important as your doctor will have to ensure that your baby is  healthy and growing well. This information is not intended to replace advice given to you by your health care provider. Make sure you discuss any questions you have with your health care provider. Document Released: 06/16/2007 Document Revised: 01/06/2016 Document Reviewed: 01/06/2016 Elsevier Interactive Patient Education  2017 [employer].

## 2017-02-08 NOTE — MAU Provider Note (Signed)
History     CSN: [id number]  Arrival date and time: 02/08/17 2034  Chief Complaint  Patient presents with  . Abdominal Pain   Jean Solis is a [age] y.o. G1P0000 at 9452w0d who presents with lower abdominal cramping. She states she has had lower abdominal cramping for a week that she rates a 5/10 and has not tried anything for the pain. She denies any vaginal bleeding or discharge. Reports a positive home pregnancy test yesterday. LMP 01/04/17.  Abdominal Pain  This is a new problem. The current episode started in the past 7 days. The onset quality is gradual. The problem occurs intermittently. The problem has been unchanged. The pain is located in the suprapubic region. The abdominal pain does not radiate. Pertinent negatives include no constipation, diarrhea, dysuria, fever, headaches, nausea or vomiting. Nothing aggravates the pain. The pain is relieved by nothing. She has tried nothing for the symptoms.    RN Note: PT SAYS SHE HAS BEEN CRAMPING X 1 WEEK.  DID HPT  YESTERDAY- POSITIVE .   LAST SEX-  THIS MTH.    OB History    Gravida Para Term Preterm AB Living   1 0 0 0 0 0   SAB TAB Ectopic Multiple Live Births   0 0 0 0 0      Past Medical History:  Diagnosis Date  . Chlamydia 09/2011    Past Surgical History:  Procedure Laterality Date  . TONSILLECTOMY      Family History  Problem Relation Age of Onset  . Hypertension Mother   . Hyperlipidemia Mother   . Hyperlipidemia Father   . Hypertension Father   . Hypertension Maternal Grandmother     Social History   Tobacco Use  . Smoking status: Never Smoker  . Smokeless tobacco: Never Used  Substance Use Topics  . Alcohol use: Yes  . Drug use: No    Allergies: No Known Allergies  Medications Prior to Admission  Medication Sig Dispense Refill Last Dose  . doxycycline (VIBRAMYCIN) 100 MG capsule Take 1 capsule (100 mg total) by mouth 2 (two) times daily. 28 capsule 0   . Etonogestrel (NEXPLANON [place]) Inject 1  application into the skin once.    09/29/2015 at Unknown time  . fluconazole (DIFLUCAN) 150 MG tablet Take 1 tab PO. May repeat if no improvement. 2 tablet 0   . ondansetron (ZOFRAN ODT) 4 MG disintegrating tablet 4mg  ODT q4 hours prn nausea/vomit 4 tablet 0     Review of Systems  Constitutional: Negative.  Negative for fatigue and fever.  HENT: Negative.   Respiratory: Negative.  Negative for shortness of breath.   Cardiovascular: Negative.  Negative for chest pain.  Gastrointestinal: Positive for abdominal pain. Negative for constipation, diarrhea, nausea and vomiting.  Genitourinary: Negative.  Negative for dysuria, vaginal bleeding and vaginal discharge.  Neurological: Negative.  Negative for dizziness and headaches.   Physical Exam   Blood pressure 126/68, pulse 89, temperature 99.1 F (37.3 C), temperature source Oral, resp. rate 20, height 5\' 5"  (1.651 m), weight 211 lb 4 oz (95.8 kg), last menstrual period 01/04/2017.  Physical Exam  Nursing note and vitals reviewed. Constitutional: She is oriented to person, place, and time. She appears well-developed and well-nourished. No distress.  HENT:  Head: Normocephalic.  Eyes: Pupils are equal, round, and reactive to light.  Cardiovascular: Normal rate, regular rhythm and normal heart sounds.  Respiratory: Effort normal and breath sounds normal. No respiratory distress.  GI: Soft. Bowel  sounds are normal. She exhibits no distension. There is no tenderness.  Neurological: She is alert and oriented to person, place, and time.  Skin: Skin is warm and dry.  Psychiatric: She has a normal mood and affect. Her behavior is normal. Judgment and thought content normal.   Pelvic exam: Cervix pink, visually closed, without lesion, scant white creamy discharge, vaginal walls and external genitalia normal Bimanual exam: Cervix 0/long/high, firm, anterior, neg CMT, uterus nontender, nonenlarged, adnexa without tenderness, enlargement, or  mass  MAU Course  Procedures Results for orders placed or performed during the hospital encounter of 02/08/17 (from the past 24 hour(s))  Urinalysis, Routine w reflex microscopic     Status: None   Collection Time: 02/08/17  8:50 PM  Result Value Ref Range   Color, Urine YELLOW YELLOW   APPearance CLEAR CLEAR   Specific Gravity, Urine 1.024 1.005 - 1.030   pH 6.0 5.0 - 8.0   Glucose, UA NEGATIVE NEGATIVE mg/dL   Hgb urine dipstick NEGATIVE NEGATIVE   Bilirubin Urine NEGATIVE NEGATIVE   Ketones, ur NEGATIVE NEGATIVE mg/dL   Protein, ur NEGATIVE NEGATIVE mg/dL   Nitrite NEGATIVE NEGATIVE   Leukocytes, UA NEGATIVE NEGATIVE  Pregnancy, urine POC     Status: Abnormal   Collection Time: 02/08/17  9:08 PM  Result Value Ref Range   Preg Test, Ur POSITIVE (A) NEGATIVE  CBC     Status: Abnormal   Collection Time: 02/08/17  9:10 PM  Result Value Ref Range   WBC 9.0 4.0 - 10.5 K/uL   RBC 4.33 3.87 - 5.11 MIL/uL   Hemoglobin 11.9 (L) 12.0 - 15.0 g/dL   HCT 96.2 (L) 95.2 - 84.1 %   MCV 82.0 78.0 - 100.0 fL   MCH 27.5 26.0 - 34.0 pg   MCHC 33.5 30.0 - 36.0 g/dL   RDW 32.4 40.1 - 02.7 %   Platelets 306 150 - 400 K/uL   MDM UA, UPT CBC, HCG, ABO/Rh Wet prep and gc/chlamydia US OB Transvaginal US OB Comp Less 14 weeks- probable early gestational sac  Care turned over to M. Reymundo Winship CNM at 2200.  Rolm Bookbinder, CNM 02/08/17 9:57 PM   Assessment and Plan   1. Pregnancy of unknown anatomic location   2. Abdominal pain affecting pregnancy   3. [redacted] weeks gestation of pregnancy    US Ob Less Than 14 Weeks With Ob Transvaginal  Result Date: 02/08/2017 CLINICAL DATA:  Cramping. Estimated gestational age by LMP is 5 weeks 0 days. Quantitative beta HCG is pending. Positive urine pregnancy test. EXAM: OBSTETRIC <14 WK [place] AND TRANSVAGINAL OB US TECHNIQUE: Both transabdominal and transvaginal ultrasound examinations were performed for complete evaluation of the gestation as well as the  maternal uterus, adnexal regions, and pelvic cul-de-sac. Transvaginal technique was performed to assess early pregnancy. COMPARISON:  None. FINDINGS: Intrauterine gestational sac: A single intrauterine gestational sac is identified. Yolk sac:  Not Visualized. Embryo:  Not Visualized. Cardiac Activity: Not Visualized. MSD: 4.5 mm   5 w   1 d Subchorionic hemorrhage:  None visualized. Maternal uterus/adnexae: Uterus is anteverted. No myometrial mass lesions demonstrated. Both ovaries are visualized and appear normal. Minimal free fluid in the pelvis. IMPRESSION: Probable early intrauterine gestational sac, but no yolk sac, fetal pole, or cardiac activity yet visualized. Recommend follow-up quantitative B-HCG levels and follow-up US in 14 days to assess viability. This recommendation follows SRU consensus guidelines: Diagnostic Criteria for Nonviable Pregnancy Early in the First Trimester. N Engl  J Med 2013; 528:4132-44. Electronically Signed   By: Burman Nieves M.D.   On: 02/08/2017 21:58   Discussed US findings Cannot rule out ectopic just yet Will need repeat US in 7-10 days to confirm yolk sac development Ectopic precautions Aviva Signs, CNM

## 2017-02-09 LAB — GC/CHLAMYDIA PROBE AMP (~~LOC~~) NOT AT ARMC
CHLAMYDIA, DNA PROBE: NEGATIVE
NEISSERIA GONORRHEA: NEGATIVE

## 2017-02-09 LAB — ABO/RH: ABO/RH(D): B POS

## 2017-02-10 ENCOUNTER — Telehealth: Payer: Self-pay | Admitting: General Practice

## 2017-02-10 ENCOUNTER — Ambulatory Visit: Payer: Self-pay | Admitting: General Practice

## 2017-02-10 ENCOUNTER — Other Ambulatory Visit: Payer: 59

## 2017-02-10 DIAGNOSIS — O3680X0 Pregnancy with inconclusive fetal viability, not applicable or unspecified: Secondary | ICD-10-CM

## 2017-02-10 LAB — HCG, QUANTITATIVE, PREGNANCY: hCG, Beta Chain, Quant, S: 3450 m[IU]/mL — ABNORMAL HIGH (ref <5)

## 2017-02-10 NOTE — Progress Notes (Signed)
Patient here for stat bhcg. Patient denies pain or bleeding today. Discussed with patient we are monitoring her beta hcg levels today and asked she wait in lobby for results/updated plan of care. Patient states she cannot wait today as she has an appt at 1pm that has already been rescheduled before. Patient states she wasn't even aware she had to be here today until this morning. Patient left reliable phone number of [phone] and is aware she might have to come back to the hospital as soon as possible. Dr Shawnie PonsPratt aware.   Reviewed results with Dr Vergie LivingPickens who finds favorable rise in bhcg levels. Patient should have follow up ultrasound in 10 days. Scheduled for 2/8 @ 10am. Will call patient with the results

## 2017-02-10 NOTE — Telephone Encounter (Signed)
Called patient with bhcg results & scheduled ultrasound appt. Patient verbalized understanding and is aware to go to MAU for evaluation if she develops severe pain or heavy bleeding. Patient had no questions

## 2017-02-18 ENCOUNTER — Ambulatory Visit (HOSPITAL_COMMUNITY)
Admission: RE | Admit: 2017-02-18 | Discharge: 2017-02-18 | Disposition: A | Discharge status: Home / Self Care | Payer: Medicaid Other | Source: Ambulatory Visit

## 2017-02-18 ENCOUNTER — Ambulatory Visit: Payer: Self-pay | Admitting: General Practice

## 2017-02-18 DIAGNOSIS — Z3A01 Less than 8 weeks gestation of pregnancy: Secondary | ICD-10-CM | POA: Insufficient documentation

## 2017-02-18 DIAGNOSIS — Z3490 Encounter for supervision of normal pregnancy, unspecified, unspecified trimester: Secondary | ICD-10-CM

## 2017-02-18 DIAGNOSIS — Z3491 Encounter for supervision of normal pregnancy, unspecified, first trimester: Secondary | ICD-10-CM | POA: Diagnosis not present

## 2017-02-18 DIAGNOSIS — O3680X0 Pregnancy with inconclusive fetal viability, not applicable or unspecified: Secondary | ICD-10-CM

## 2017-02-18 NOTE — Progress Notes (Signed)
Pt in for ultrasound results. Reviewed with Dr. Marice Potterove. Pt already set up for prenatal care. She had no questions or concerns.

## 2017-03-22 DIAGNOSIS — Z3400 Encounter for supervision of normal first pregnancy, unspecified trimester: Secondary | ICD-10-CM | POA: Insufficient documentation

## 2017-03-23 ENCOUNTER — Encounter: Payer: Self-pay | Admitting: Certified Nurse Midwife

## 2017-03-23 ENCOUNTER — Other Ambulatory Visit (HOSPITAL_COMMUNITY)
Admission: RE | Admit: 2017-03-23 | Discharge: 2017-03-23 | Disposition: A | Discharge status: Home / Self Care | Payer: Medicaid Other | Source: Ambulatory Visit | Attending: Certified Nurse Midwife | Admitting: Certified Nurse Midwife

## 2017-03-23 ENCOUNTER — Ambulatory Visit (INDEPENDENT_AMBULATORY_CARE_PROVIDER_SITE_OTHER): Payer: Medicaid Other | Admitting: Certified Nurse Midwife

## 2017-03-23 VITALS — BP 114/77 | HR 105 | Wt 209.8 lb

## 2017-03-23 DIAGNOSIS — Z3A10 10 weeks gestation of pregnancy: Secondary | ICD-10-CM | POA: Diagnosis not present

## 2017-03-23 DIAGNOSIS — Z3401 Encounter for supervision of normal first pregnancy, first trimester: Secondary | ICD-10-CM | POA: Insufficient documentation

## 2017-03-23 DIAGNOSIS — O26879 Cervical shortening, unspecified trimester: Secondary | ICD-10-CM

## 2017-03-23 DIAGNOSIS — O219 Vomiting of pregnancy, unspecified: Secondary | ICD-10-CM

## 2017-03-23 DIAGNOSIS — B3731 Acute candidiasis of vulva and vagina: Secondary | ICD-10-CM

## 2017-03-23 DIAGNOSIS — Z3400 Encounter for supervision of normal first pregnancy, unspecified trimester: Secondary | ICD-10-CM

## 2017-03-23 DIAGNOSIS — E669 Obesity, unspecified: Secondary | ICD-10-CM

## 2017-03-23 DIAGNOSIS — B373 Candidiasis of vulva and vagina: Secondary | ICD-10-CM

## 2017-03-23 DIAGNOSIS — O99210 Obesity complicating pregnancy, unspecified trimester: Secondary | ICD-10-CM | POA: Insufficient documentation

## 2017-03-23 MED ORDER — TERCONAZOLE 0.8 % VA CREA: 1.0000 | TOPICAL_CREAM | Freq: Every day | VAGINAL | 0 refills | Status: DC

## 2017-03-23 MED ORDER — VITAFOL-NANO 18-0.6-0.4 MG PO TABS: 1.0000 | ORAL_TABLET | Freq: Every day | ORAL | 12 refills | Status: DC

## 2017-03-23 MED ORDER — DOXYLAMINE-PYRIDOXINE 10-10 MG PO TBEC: DELAYED_RELEASE_TABLET | ORAL | 4 refills | Status: DC

## 2017-03-23 NOTE — Progress Notes (Signed)
Subjective:   PHYLLISS STREGE is a 22 y.o. G1P0000 at [redacted]w[redacted]d by LMP being seen today for her first obstetrical visit.  Her obstetrical history is significant for obesity. Patient does intend to breast feed. Pregnancy history fully reviewed.  Employed at [employer] as a Psychologist, sport and exercise on ortho floor.    Patient reports nausea, no bleeding, no contractions, no cramping and no leaking.  HISTORY: Obstetric History   G1   P0   T0   P0   A0   L0    SAB0   TAB0   Ectopic0   Multiple0   Live Births0     # Outcome Date GA Lbr Len/2nd Weight Sex Delivery Anes PTL Lv  1 Current               Last pap smear was done At [facility] 2 years ago and was normal according to the patient  Past Medical History:  Diagnosis Date  . Chlamydia 09/2011   Past Surgical History:  Procedure Laterality Date  . TONSILLECTOMY     Family History  Problem Relation Age of Onset  . Hypertension Mother   . Hyperlipidemia Mother   . Hyperlipidemia Father   . Hypertension Father   . Hypertension Maternal Grandmother   . Diabetes Maternal Uncle    Social History   Tobacco Use  . Smoking status: Never Smoker  . Smokeless tobacco: Never Used  Substance Use Topics  . Alcohol use: No    Frequency: Never    Comment: Socially   . Drug use: No   No Known Allergies Current Outpatient Medications on File Prior to Visit  Medication Sig Dispense Refill  . metroNIDAZOLE (FLAGYL) 500 MG tablet Take 500 mg by mouth 3 (three) times daily.    . Prenatal Vit-Fe Fumarate-FA (PRENATAL MULTIVITAMIN) TABS tablet Take 1 tablet by mouth daily at 12 noon.     No current facility-administered medications on file prior to visit.     Review of Systems Pertinent items noted in HPI and remainder of comprehensive ROS otherwise negative.  Exam   Vitals:   03/23/17 1016  BP: 114/77  Pulse: (!) 105  Weight: 209 lb 12.8 oz (95.2 kg)      Uterus:     Pelvic Exam: Perineum: no hemorrhoids, normal perineum   Vulva: normal external  genitalia, no lesions   Vagina:  normal mucosa, normal discharge   Cervix: no lesions and normal, pap smear done. Friable with pap smear.  Short cervical length noted, + thick chunky white vaginal discharge, ?strawberry cervix vs ectropic   Adnexa: normal adnexa and no mass, fullness, tenderness   Bony Pelvis: average  System: General: well-developed, well-nourished female in no acute distress   Breast:  normal appearance, no masses or tenderness   Skin: normal coloration and turgor, no rashes   Neurologic: oriented, normal, negative, normal mood   Extremities: normal strength, tone, and muscle mass, ROM of all joints is normal   HEENT PERRLA, extraocular movement intact and sclera clear, anicteric   Mouth/Teeth mucous membranes moist, pharynx normal without lesions and dental hygiene good   Neck supple and no masses   Cardiovascular: regular rate and rhythm   Respiratory:  no respiratory distress, normal breath sounds   Abdomen: soft, non-tender; bowel sounds normal; no masses,  no organomegaly     Assessment:   Pregnancy: G1P0000 Patient Active Problem List   Diagnosis Date Noted  . Obesity in pregnancy 03/23/2017  . Supervision  of normal first pregnancy, antepartum 03/22/2017  . Eczema 12/27/2011  . Acne 12/27/2011  . High risk heterosexual behavior 12/27/2011     Plan:  1. Supervision of normal first pregnancy, antepartum    - Obstetric Panel, Including HIV - Hemoglobinopathy evaluation - Culture, OB Urine - Cytology - PAP - Vitamin D (25 hydroxy) - HgB A1c - Cervicovaginal ancillary only - Inheritest Core(CF97,SMA,FraX) - US MFM OB DETAIL +14 WK; Future - Prenatal-Fe Fum-Methf-FA w/o A (VITAFOL-NANO) 18-0.6-0.4 MG TABS; Take 1 tablet by mouth daily.  Dispense: 30 tablet; Refill: 12  2. Obesity in pregnancy      3. Short cervix, antepartum    - US OB Transvaginal; Future - US OB Comp Less 14 Wks; Future  4. Nausea/vomiting in pregnancy    -  Doxylamine-Pyridoxine (DICLEGIS) 10-10 MG TBEC; Take 1 tablet with breakfast and lunch.  Take 2 tablets at bedtime.  Dispense: 100 tablet; Refill: 4   Initial labs drawn. Continue prenatal vitamins. Genetic Screening discussed, NIPS: requested. Ultrasound discussed; fetal anatomic survey: ordered. Problem list reviewed and updated. The nature of [facility] with multiple MDs and other Advanced Practice Providers was explained to patient; also emphasized that residents, students are part of our team. Routine obstetric precautions reviewed. Return in about 4 weeks (around 04/20/2017) for ROB/Panorama.     Orvilla Cornwallachelle Aedin Jeansonne, CNM [facility]

## 2017-03-23 NOTE — Progress Notes (Signed)
Pt is in the office for initial ob visit, unplanned pregnancy, pt states fob is not involved.

## 2017-03-24 LAB — CERVICOVAGINAL ANCILLARY ONLY
BACTERIAL VAGINITIS: NEGATIVE
Candida vaginitis: POSITIVE — AB
Chlamydia: NEGATIVE
Neisseria Gonorrhea: NEGATIVE
TRICH (WINDOWPATH): NEGATIVE

## 2017-03-25 ENCOUNTER — Other Ambulatory Visit: Payer: Self-pay | Admitting: Certified Nurse Midwife

## 2017-03-25 DIAGNOSIS — Z3400 Encounter for supervision of normal first pregnancy, unspecified trimester: Secondary | ICD-10-CM

## 2017-03-25 LAB — OBSTETRIC PANEL, INCLUDING HIV
Antibody Screen: NEGATIVE
BASOS ABS: 0 10*3/uL (ref 0.0–0.2)
Basos: 0 %
EOS (ABSOLUTE): 0.1 10*3/uL (ref 0.0–0.4)
EOS: 1 %
HEMOGLOBIN: 12.2 g/dL (ref 11.1–15.9)
HEP B S AG: NEGATIVE
HIV SCREEN 4TH GENERATION: NONREACTIVE
Hematocrit: 37.1 % (ref 34.0–46.6)
Immature Grans (Abs): 0 10*3/uL (ref 0.0–0.1)
Immature Granulocytes: 0 %
Lymphocytes Absolute: 2.1 10*3/uL (ref 0.7–3.1)
Lymphs: 24 %
MCH: 27.4 pg (ref 26.6–33.0)
MCHC: 32.9 g/dL (ref 31.5–35.7)
MCV: 83 fL (ref 79–97)
MONOCYTES: 5 %
Monocytes Absolute: 0.5 10*3/uL (ref 0.1–0.9)
NEUTROS ABS: 6.1 10*3/uL (ref 1.4–7.0)
Neutrophils: 70 %
PLATELETS: 329 10*3/uL (ref 150–379)
RBC: 4.45 x10E6/uL (ref 3.77–5.28)
RDW: 14.3 % (ref 12.3–15.4)
RPR: NONREACTIVE
RUBELLA: 3.49 {index} (ref >0.99)
Rh Factor: POSITIVE
WBC: 8.8 10*3/uL (ref 3.4–10.8)

## 2017-03-25 LAB — CYTOLOGY - PAP: Diagnosis: NEGATIVE

## 2017-03-25 LAB — HEMOGLOBINOPATHY EVALUATION
HGB A: 97.6 % (ref 96.4–98.8)
HGB C: 0 %
HGB S: 0 %
HGB VARIANT: 0 %
Hemoglobin A2 Quantitation: 2.4 % (ref 1.8–3.2)
Hemoglobin F Quantitation: 0 % (ref 0.0–2.0)

## 2017-03-25 LAB — HEMOGLOBIN A1C
Est. average glucose Bld gHb Est-mCnc: 117 mg/dL
HEMOGLOBIN A1C: 5.7 % — AB (ref 4.8–5.6)

## 2017-03-25 LAB — CULTURE, OB URINE

## 2017-03-25 LAB — URINE CULTURE, OB REFLEX

## 2017-03-25 LAB — VITAMIN D 25 HYDROXY (VIT D DEFICIENCY, FRACTURES): Vit D, 25-Hydroxy: 16.2 ng/mL — ABNORMAL LOW (ref 30.0–100.0)

## 2017-03-28 ENCOUNTER — Other Ambulatory Visit: Payer: Self-pay | Admitting: Certified Nurse Midwife

## 2017-03-28 DIAGNOSIS — Z3400 Encounter for supervision of normal first pregnancy, unspecified trimester: Secondary | ICD-10-CM

## 2017-03-28 DIAGNOSIS — E559 Vitamin D deficiency, unspecified: Secondary | ICD-10-CM | POA: Insufficient documentation

## 2017-03-28 DIAGNOSIS — R7309 Other abnormal glucose: Secondary | ICD-10-CM | POA: Insufficient documentation

## 2017-04-04 LAB — INHERITEST CORE(CF97,SMA,FRAX)

## 2017-04-06 ENCOUNTER — Ambulatory Visit (HOSPITAL_COMMUNITY)
Admission: RE | Admit: 2017-04-06 | Discharge: 2017-04-06 | Disposition: A | Discharge status: Home / Self Care | Payer: Medicaid Other | Source: Ambulatory Visit | Attending: Certified Nurse Midwife | Admitting: Certified Nurse Midwife

## 2017-04-06 DIAGNOSIS — O26879 Cervical shortening, unspecified trimester: Secondary | ICD-10-CM | POA: Diagnosis not present

## 2017-04-06 DIAGNOSIS — Z3A13 13 weeks gestation of pregnancy: Secondary | ICD-10-CM | POA: Insufficient documentation

## 2017-04-07 ENCOUNTER — Encounter: Payer: Self-pay | Admitting: Family Medicine

## 2017-04-07 DIAGNOSIS — E669 Obesity, unspecified: Secondary | ICD-10-CM | POA: Insufficient documentation

## 2017-04-08 ENCOUNTER — Other Ambulatory Visit: Payer: Self-pay | Admitting: Certified Nurse Midwife

## 2017-04-08 DIAGNOSIS — Z3400 Encounter for supervision of normal first pregnancy, unspecified trimester: Secondary | ICD-10-CM

## 2017-04-13 ENCOUNTER — Other Ambulatory Visit: Payer: Self-pay | Admitting: Certified Nurse Midwife

## 2017-04-20 ENCOUNTER — Ambulatory Visit (INDEPENDENT_AMBULATORY_CARE_PROVIDER_SITE_OTHER): Payer: Medicaid Other | Admitting: Certified Nurse Midwife

## 2017-04-20 ENCOUNTER — Encounter: Payer: Self-pay | Admitting: Certified Nurse Midwife

## 2017-04-20 VITALS — BP 135/88 | HR 113 | Wt 215.0 lb

## 2017-04-20 DIAGNOSIS — Z3400 Encounter for supervision of normal first pregnancy, unspecified trimester: Secondary | ICD-10-CM

## 2017-04-20 DIAGNOSIS — E559 Vitamin D deficiency, unspecified: Secondary | ICD-10-CM

## 2017-04-20 DIAGNOSIS — R011 Cardiac murmur, unspecified: Secondary | ICD-10-CM | POA: Insufficient documentation

## 2017-04-20 DIAGNOSIS — O99210 Obesity complicating pregnancy, unspecified trimester: Secondary | ICD-10-CM

## 2017-04-20 MED ORDER — VITAMIN D (ERGOCALCIFEROL) 1.25 MG (50000 UNIT) PO CAPS: 50000.0000 [IU] | ORAL_CAPSULE | ORAL | 2 refills | Status: DC

## 2017-04-20 NOTE — Progress Notes (Signed)
   PRENATAL VISIT NOTE  Subjective:  Jean Solis is a 22 y.o. G1P0000 at 7647w6d being seen today for ongoing prenatal care.  She is currently monitored for the following issues for this high-risk pregnancy and has Eczema; Acne; High risk heterosexual behavior; Supervision of normal first pregnancy, antepartum; Obesity affecting pregnancy, antepartum; Elevated hemoglobin A1c; Vitamin D deficiency; Obesity (BMI 30.0-34.9); and Cardiac murmur, previously undiagnosed on their problem list.  Patient reports no complaints.  Contractions: Not present. Vag. Bleeding: None.   . Denies leaking of fluid.   The following portions of the patient's history were reviewed and updated as appropriate: allergies, current medications, past family history, past medical history, past social history, past surgical history and problem list. Problem list updated.  Objective:   Vitals:   04/20/17 1340  BP: 135/88  Pulse: (!) 113  Weight: 215 lb (97.5 kg)    Fetal Status: Fetal Heart Rate (bpm): 162; doppler         General:  Alert, oriented and cooperative. Patient is in no acute distress.  Skin: Skin is warm and dry. No rash noted.   Cardiovascular: Normal heart rate noted, + cardiac murmur grade II  Respiratory: Normal respiratory effort, no problems with respiration noted  Abdomen: Soft, gravid, appropriate for gestational age.  Pain/Pressure: Absent     Pelvic: Cervical exam deferred        Extremities: Normal range of motion.     Mental Status: Normal mood and affect. Normal behavior. Normal judgment and thought content.   Assessment and Plan:  Pregnancy: G1P0000 at 4247w6d  1. Supervision of normal first pregnancy, antepartum      Reports shortness of breath with activity, denies chest pain, vertigo or other symptoms.         Needs early 2 hour OGTT  2. Obesity affecting pregnancy, antepartum    2 lb weight gain  3. Vitamin D deficiency      - Vitamin D, Ergocalciferol, (DRISDOL) 50000 units  CAPS capsule; Take 1 capsule (50,000 Units total) by mouth every 7 (seven) days.  Dispense: 30 capsule; Refill: 2  4. Cardiac murmur, previously undiagnosed     New onset.  - Ambulatory referral to Cardiology  Preterm labor symptoms and general obstetric precautions including but not limited to vaginal bleeding, contractions, leaking of fluid and fetal movement were reviewed in detail with the patient. Please refer to After Visit Summary for other counseling recommendations.  Return in about 1 month (around 05/18/2017) for ROB /c AFP & Panorama, Needs 2 hour OGTT ASAP.  Future Appointments  Date Time Provider Department Center  04/26/2017  9:15 AM [facility] None  05/18/2017 10:45 AM Orvilla Cornwallenney, Caprice Mccaffrey A, CNM [facility] None  05/20/2017 10:15 AM [facility] US 4 [facility]    Roe Coombsachelle A Nieves Chapa, CNM

## 2017-04-26 ENCOUNTER — Other Ambulatory Visit: Payer: Medicaid Other

## 2017-04-26 DIAGNOSIS — Z3400 Encounter for supervision of normal first pregnancy, unspecified trimester: Secondary | ICD-10-CM

## 2017-04-27 ENCOUNTER — Other Ambulatory Visit: Payer: Self-pay | Admitting: Certified Nurse Midwife

## 2017-04-27 DIAGNOSIS — Z3400 Encounter for supervision of normal first pregnancy, unspecified trimester: Secondary | ICD-10-CM

## 2017-04-27 LAB — GLUCOSE TOLERANCE, 2 HOURS W/ 1HR
GLUCOSE, 1 HOUR: 101 mg/dL (ref 65–179)
GLUCOSE, FASTING: 79 mg/dL (ref 65–91)
Glucose, 2 hour: 84 mg/dL (ref 65–152)

## 2017-04-27 LAB — CBC
HEMATOCRIT: 35.4 % (ref 34.0–46.6)
Hemoglobin: 11.5 g/dL (ref 11.1–15.9)
MCH: 26.7 pg (ref 26.6–33.0)
MCHC: 32.5 g/dL (ref 31.5–35.7)
MCV: 82 fL (ref 79–97)
PLATELETS: 303 10*3/uL (ref 150–379)
RBC: 4.3 x10E6/uL (ref 3.77–5.28)
RDW: 14.5 % (ref 12.3–15.4)
WBC: 9.6 10*3/uL (ref 3.4–10.8)

## 2017-04-27 LAB — HIV ANTIBODY (ROUTINE TESTING W REFLEX): HIV Screen 4th Generation wRfx: NONREACTIVE

## 2017-04-27 LAB — RPR: RPR Ser Ql: NONREACTIVE

## 2017-05-06 ENCOUNTER — Encounter: Payer: Self-pay | Admitting: Physician Assistant

## 2017-05-18 ENCOUNTER — Ambulatory Visit (INDEPENDENT_AMBULATORY_CARE_PROVIDER_SITE_OTHER): Payer: Medicaid Other | Admitting: Certified Nurse Midwife

## 2017-05-18 ENCOUNTER — Other Ambulatory Visit: Payer: Self-pay

## 2017-05-18 VITALS — BP 131/80 | HR 106 | Wt 218.0 lb

## 2017-05-18 DIAGNOSIS — E559 Vitamin D deficiency, unspecified: Secondary | ICD-10-CM

## 2017-05-18 DIAGNOSIS — R7309 Other abnormal glucose: Secondary | ICD-10-CM

## 2017-05-18 DIAGNOSIS — Z3400 Encounter for supervision of normal first pregnancy, unspecified trimester: Secondary | ICD-10-CM

## 2017-05-18 NOTE — Progress Notes (Signed)
   PRENATAL VISIT NOTE  Subjective:  Jean Solis is a 22 y.o. G1P0000 at [redacted]w[redacted]d being seen today for ongoing prenatal care.  She is currently monitored for the following issues for this low-risk pregnancy and has Eczema; Acne; High risk heterosexual behavior; Supervision of normal first pregnancy, antepartum; Obesity affecting pregnancy, antepartum; Elevated hemoglobin A1c; Vitamin D deficiency; Obesity (BMI 30.0-34.9); and Cardiac murmur, previously undiagnosed on their problem list.  Patient reports no bleeding, no contractions, no cramping, no leaking and left foot pain X 1 month, no injury.  Discussed new tennis shoes, has the same pair for 3 years.  Reports history of bulging disk? ROI completed.  Contractions: Not present. Vag. Bleeding: None.  Movement: Present. Denies leaking of fluid.   The following portions of the patient's history were reviewed and updated as appropriate: allergies, current medications, past family history, past medical history, past social history, past surgical history and problem list. Problem list updated.  Objective:   Vitals:   05/18/17 1040  BP: 131/80  Pulse: (!) 106  Weight: 218 lb (98.9 kg)    Fetal Status:     Movement: Present     General:  Alert, oriented and cooperative. Patient is in no acute distress.  Skin: Skin is warm and dry. No rash noted.   Cardiovascular: Normal heart rate noted  Respiratory: Normal respiratory effort, no problems with respiration noted  Abdomen: Soft, gravid, appropriate for gestational age.  Pain/Pressure: Absent     Pelvic: Cervical exam deferred        Extremities: Normal range of motion.  Edema: None  Mental Status: Normal mood and affect. Normal behavior. Normal judgment and thought content.   Assessment and Plan:  Pregnancy: G1P0000 at [redacted]w[redacted]d  1. Supervision of normal first pregnancy, antepartum     Doing well. Has anatomy scan scheduled for 05/20/17.  - AFP, Serum, Open Spina Bifida  2. Elevated  hemoglobin A1c     Normal early 2 hour OGTT  3. Vitamin D deficiency     Taking weekly vitamin D.   Preterm labor symptoms and general obstetric precautions including but not limited to vaginal bleeding, contractions, leaking of fluid and fetal movement were reviewed in detail with the patient. Please refer to After Visit Summary for other counseling recommendations.  Return in about 1 month (around 06/15/2017) for ROB.  Future Appointments  Date Time Provider Department Center  05/20/2017 10:15 AM [facility] [place] 4 [facility]  05/25/2017  2:30 PM Dunn, Tacey Ruiz, PA-C [facility]    Roe Coombs, CNM

## 2017-05-18 NOTE — Progress Notes (Signed)
ROB.  C/o pain in left foot 8/10 x 1 month.

## 2017-05-20 ENCOUNTER — Other Ambulatory Visit: Payer: Self-pay | Admitting: Certified Nurse Midwife

## 2017-05-20 ENCOUNTER — Ambulatory Visit (HOSPITAL_COMMUNITY)
Admission: RE | Admit: 2017-05-20 | Discharge: 2017-05-20 | Disposition: A | Discharge status: Home / Self Care | Payer: Medicaid Other | Source: Ambulatory Visit | Attending: Certified Nurse Midwife | Admitting: Certified Nurse Midwife

## 2017-05-20 DIAGNOSIS — Z363 Encounter for antenatal screening for malformations: Secondary | ICD-10-CM

## 2017-05-20 DIAGNOSIS — O99212 Obesity complicating pregnancy, second trimester: Secondary | ICD-10-CM

## 2017-05-20 DIAGNOSIS — Z3A19 19 weeks gestation of pregnancy: Secondary | ICD-10-CM

## 2017-05-20 DIAGNOSIS — Z3400 Encounter for supervision of normal first pregnancy, unspecified trimester: Secondary | ICD-10-CM

## 2017-05-23 ENCOUNTER — Other Ambulatory Visit: Payer: Self-pay | Admitting: Certified Nurse Midwife

## 2017-05-23 DIAGNOSIS — Z3400 Encounter for supervision of normal first pregnancy, unspecified trimester: Secondary | ICD-10-CM

## 2017-05-23 LAB — AFP, SERUM, OPEN SPINA BIFIDA
AFP MoM: 1.29
AFP Value: 53.8 ng/mL
GEST. AGE ON COLLECTION DATE: 18.9 wk
Maternal Age At EDD: 23.8 yr
OSBR RISK 1 IN: 9894
Test Results:: NEGATIVE
WEIGHT: 218 [lb_av]

## 2017-05-25 ENCOUNTER — Ambulatory Visit: Payer: Medicaid Other | Admitting: Physician Assistant

## 2017-06-15 ENCOUNTER — Encounter: Payer: Self-pay | Admitting: Certified Nurse Midwife

## 2017-06-15 ENCOUNTER — Ambulatory Visit (INDEPENDENT_AMBULATORY_CARE_PROVIDER_SITE_OTHER): Payer: Medicaid Other | Admitting: Certified Nurse Midwife

## 2017-06-15 ENCOUNTER — Other Ambulatory Visit: Payer: Self-pay

## 2017-06-15 VITALS — BP 131/79 | HR 115 | Wt 218.4 lb

## 2017-06-15 DIAGNOSIS — M549 Dorsalgia, unspecified: Secondary | ICD-10-CM | POA: Insufficient documentation

## 2017-06-15 DIAGNOSIS — Z3400 Encounter for supervision of normal first pregnancy, unspecified trimester: Secondary | ICD-10-CM

## 2017-06-15 DIAGNOSIS — O99891 Other specified diseases and conditions complicating pregnancy: Secondary | ICD-10-CM

## 2017-06-15 DIAGNOSIS — E559 Vitamin D deficiency, unspecified: Secondary | ICD-10-CM

## 2017-06-15 DIAGNOSIS — O9989 Other specified diseases and conditions complicating pregnancy, childbirth and the puerperium: Secondary | ICD-10-CM

## 2017-06-15 MED ORDER — VITAFOL-NANO 18-0.6-0.4 MG PO TABS: 1.0000 | ORAL_TABLET | Freq: Every day | ORAL | 12 refills | Status: DC

## 2017-06-15 MED ORDER — COMFORT FIT MATERNITY SUPP LG MISC: 1.0000 [IU] | Freq: Every day | 0 refills | Status: DC

## 2017-06-15 NOTE — Progress Notes (Signed)
   PRENATAL VISIT NOTE  Subjective:  Jean Solis is a 22 y.o. G1P0000 at 2385w6d being seen today for ongoing prenatal care.  She is currently monitored for the following issues for this low-risk pregnancy and has Eczema; Acne; High risk heterosexual behavior; Supervision of normal first pregnancy, antepartum; Obesity affecting pregnancy, antepartum; Elevated hemoglobin A1c; Vitamin D deficiency; Obesity (BMI 30.0-34.9); Cardiac murmur, previously undiagnosed; and Back pain affecting pregnancy in second trimester on their problem list.  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  Contractions: Not present. Vag. Bleeding: None.  Movement: Present. Denies leaking of fluid.   The following portions of the patient's history were reviewed and updated as appropriate: allergies, current medications, past family history, past medical history, past social history, past surgical history and problem list. Problem list updated.  Objective:   Vitals:   06/15/17 1405  BP: 131/79  Pulse: (!) 115  Weight: 218 lb 6.4 oz (99.1 kg)    Fetal Status: Fetal Heart Rate (bpm): 160; doppler Fundal Height: 25 cm Movement: Present     General:  Alert, oriented and cooperative. Patient is in no acute distress.  Skin: Skin is warm and dry. No rash noted.   Cardiovascular: Normal heart rate noted  Respiratory: Normal respiratory effort, no problems with respiration noted  Abdomen: Soft, gravid, appropriate for gestational age.  Pain/Pressure: Absent     Pelvic: Cervical exam deferred        Extremities: Normal range of motion.  Edema: None  Mental Status: Normal mood and affect. Normal behavior. Normal judgment and thought content.   Assessment and Plan:  Pregnancy: G1P0000 at 3785w6d  1. Supervision of normal first pregnancy, antepartum      - Prenatal-Fe Fum-Methf-FA w/o A (VITAFOL-NANO)  18-0.6-0.4 MG TABS; Take 1 tablet by mouth daily.  Dispense: 30 tablet; Refill: 12  2. Vitamin D deficiency     Taking weekly vitamin D  3. Back pain affecting pregnancy in second trimester    Homeopathic remedies discussed - Elastic Bandages & Supports (COMFORT FIT MATERNITY SUPP LG) MISC; 1 Units by Does not apply route daily.  Dispense: 1 each; Refill: 0  Preterm labor symptoms and general obstetric precautions including but not limited to vaginal bleeding, contractions, leaking of fluid and fetal movement were reviewed in detail with the patient. Please refer to After Visit Summary for other counseling recommendations.  Return in about 1 month (around 07/13/2017) for ROB.  Future Appointments  Date Time Provider Department Center  07/13/2017  1:30 PM Roe Coombsenney, Kalief Kattner A, CNM [facility] None    Roe Coombsachelle A Mayson Sterbenz, CNM

## 2017-06-21 ENCOUNTER — Inpatient Hospital Stay (HOSPITAL_COMMUNITY)
Admission: AD | Admit: 2017-06-21 | Discharge: 2017-06-22 | Disposition: A | Discharge status: Home / Self Care | Payer: Medicaid Other | Source: Ambulatory Visit | Attending: Obstetrics & Gynecology | Admitting: Obstetrics & Gynecology

## 2017-06-21 DIAGNOSIS — Z3A23 23 weeks gestation of pregnancy: Secondary | ICD-10-CM | POA: Diagnosis not present

## 2017-06-21 DIAGNOSIS — Z3400 Encounter for supervision of normal first pregnancy, unspecified trimester: Secondary | ICD-10-CM

## 2017-06-21 DIAGNOSIS — O368120 Decreased fetal movements, second trimester, not applicable or unspecified: Secondary | ICD-10-CM | POA: Diagnosis not present

## 2017-06-21 DIAGNOSIS — R51 Headache: Secondary | ICD-10-CM | POA: Diagnosis present

## 2017-06-21 DIAGNOSIS — O99210 Obesity complicating pregnancy, unspecified trimester: Secondary | ICD-10-CM

## 2017-06-21 DIAGNOSIS — G44209 Tension-type headache, unspecified, not intractable: Secondary | ICD-10-CM

## 2017-06-21 DIAGNOSIS — O26892 Other specified pregnancy related conditions, second trimester: Secondary | ICD-10-CM | POA: Insufficient documentation

## 2017-06-21 DIAGNOSIS — O9989 Other specified diseases and conditions complicating pregnancy, childbirth and the puerperium: Secondary | ICD-10-CM | POA: Diagnosis not present

## 2017-06-21 LAB — URINALYSIS, ROUTINE W REFLEX MICROSCOPIC
Bilirubin Urine: NEGATIVE
Glucose, UA: NEGATIVE mg/dL
Hgb urine dipstick: NEGATIVE
Ketones, ur: NEGATIVE mg/dL
Nitrite: NEGATIVE
Protein, ur: NEGATIVE mg/dL
Specific Gravity, Urine: 1.024 (ref 1.005–1.030)
pH: 7 (ref 5.0–8.0)

## 2017-06-21 MED ORDER — BUTALBITAL-APAP-CAFFEINE 50-325-40 MG PO TABS
2.0000 | ORAL_TABLET | Freq: Once | ORAL | Status: AC
  Administered 2017-06-21: 2 via ORAL
  Filled 2017-06-21: qty 2

## 2017-06-21 NOTE — MAU Provider Note (Addendum)
Chief Complaint:  Decreased Fetal Movement and Headache   First Provider Initiated Contact with Patient 06/21/17 2316     HPI: Jean Solis is a 22 y.o. G1P0000 at 54w6dwho presents to maternity admissions reporting headache for several days.  Took one tylenol days ago.  States it did not work so she did not take any more.  Also c/o no fetal movement for 4 days. . She denies LOF, vaginal bleeding, vaginal itching/burning, urinary symptoms, h/a, dizziness, n/v, diarrhea, constipation or fever/chills.  She denies headache, visual changes or RUQ abdominal pain.  Headache   This is a new problem. The current episode started in the past 7 days. The problem occurs constantly. The problem has been unchanged. The pain is located in the frontal and bilateral region. The pain does not radiate. The quality of the pain is described as aching. Pertinent negatives include no abdominal pain, back pain, blurred vision, coughing, dizziness, fever, loss of balance, muscle aches, nausea, neck pain or photophobia. Nothing aggravates the symptoms. She has tried acetaminophen for the symptoms. The treatment provided no relief.    RN Note: Pt reports HA and no movement since Thursday. FHT 160 in triage. States she has taken tylenol for HA-does not help. Last took a dose on Friday. Reports HA now-rates 4/10. Denies vision changes or RUQ.     Past Medical History: Past Medical History:  Diagnosis Date  . Chlamydia 09/2011    Past obstetric history: OB History  Gravida Para Term Preterm AB Living  1 0 0 0 0 0  SAB TAB Ectopic Multiple Live Births  0 0 0 0 0    # Outcome Date GA Lbr Len/2nd Weight Sex Delivery Anes PTL Lv  1 Current             Past Surgical History: Past Surgical History:  Procedure Laterality Date  . TONSILLECTOMY      Family History: Family History  Problem Relation Age of Onset  . Hypertension Mother   . Hyperlipidemia Mother   . Hyperlipidemia Father   . Hypertension Father    . Hypertension Maternal Grandmother   . Diabetes Maternal Uncle     Social History: Social History   Tobacco Use  . Smoking status: Never Smoker  . Smokeless tobacco: Never Used  Substance Use Topics  . Alcohol use: No    Frequency: Never    Comment: Socially   . Drug use: No    Allergies: No Known Allergies  Meds:  Medications Prior to Admission  Medication Sig Dispense Refill Last Dose  . Prenatal-Fe Fum-Methf-FA w/o A (VITAFOL-NANO) 18-0.6-0.4 MG TABS Take 1 tablet by mouth daily. 30 tablet 12 06/20/2017 at Unknown time  . Vitamin D, Ergocalciferol, (DRISDOL) 50000 units CAPS capsule Take 1 capsule (50,000 Units total) by mouth every 7 (seven) days. 30 capsule 2 06/20/2017 at Unknown time  . Doxylamine-Pyridoxine (DICLEGIS) 10-10 MG TBEC Take 1 tablet with breakfast and lunch.  Take 2 tablets at bedtime. (Patient not taking: Reported on 05/18/2017) 100 tablet 4 not taking  . Elastic Bandages & Supports (COMFORT FIT MATERNITY SUPP LG) MISC 1 Units by Does not apply route daily. 1 each 0   . metroNIDAZOLE (FLAGYL) 500 MG tablet Take 500 mg by mouth 3 (three) times daily.   not taking  . Prenatal Vit-Fe Fumarate-FA (PRENATAL MULTIVITAMIN) TABS tablet Take 1 tablet by mouth daily at 12 noon.   not taking  . terconazole (TERAZOL 3) 0.8 % vaginal cream Place 1  applicator vaginally at bedtime. (Patient not taking: Reported on 05/18/2017) 20 g 0 Not Taking    I have reviewed patient's Past Medical Hx, Surgical Hx, Family Hx, Social Hx, medications and allergies.   ROS:  Review of Systems  Constitutional: Negative for fever.  Eyes: Negative for blurred vision and photophobia.  Respiratory: Negative for cough.   Gastrointestinal: Negative for abdominal pain and nausea.  Musculoskeletal: Negative for back pain and neck pain.  Neurological: Positive for headaches. Negative for dizziness and loss of balance.   Other systems negative  Physical Exam   Patient Vitals for the past 24  hrs:  BP Temp Pulse Resp SpO2 Height Weight  06/21/17 2324 118/72 - (!) 106 - - - -  06/21/17 2244 140/75 98 F (36.7 C) (!) 101 18 100 % 5\' 5"  (1.651 m) 220 lb (99.8 kg)   Constitutional: Well-developed, well-nourished female in no acute distress.  Cardiovascular: normal rate and rhythm Respiratory: normal effort, clear to auscultation bilaterally GI: Abd soft, non-tender, gravid appropriate for gestational age.   No rebound or guarding. MS: Extremities nontender, no edema, normal ROM Neurologic: Alert and oriented x 4.  GU: Neg CVAT.  PELVIC EXAM: deferred  FHT:  Baseline 145 , moderate variability, accelerations present, no decelerations Considerable fetal activity audible   Patient does not feel audible movements Contractions:   Rare   Labs: Results for orders placed or performed during the hospital encounter of 06/21/17 (from the past 24 hour(s))  Urinalysis, Routine w reflex microscopic     Status: Abnormal   Collection Time: 06/21/17 10:47 PM  Result Value Ref Range   Color, Urine YELLOW YELLOW   APPearance CLOUDY (A) CLEAR   Specific Gravity, Urine 1.024 1.005 - 1.030   pH 7.0 5.0 - 8.0   Glucose, UA NEGATIVE NEGATIVE mg/dL   Hgb urine dipstick NEGATIVE NEGATIVE   Bilirubin Urine NEGATIVE NEGATIVE   Ketones, ur NEGATIVE NEGATIVE mg/dL   Protein, ur NEGATIVE NEGATIVE mg/dL   Nitrite NEGATIVE NEGATIVE   Leukocytes, UA SMALL (A) NEGATIVE   RBC / HPF 0-5 0 - 5 RBC/hpf   WBC, UA 11-20 0 - 5 WBC/hpf   Bacteria, UA MANY (A) NONE SEEN   Squamous Epithelial / LPF 6-10 0 - 5   Mucus PRESENT    B/Positive/-- (03/13 1111)  Imaging:  No results found.  MAU Course/MDM: I have ordered labs and reviewed results. UA normal NST reviewed and is reassuring.  Treatments in MAU included Fioricet which stopped headache. .    Assessment: Single IUP at 4372w6d Headache Decreased perception of fetal movement  Plan: Discharge home Discussed fetal movement may be affected by young  gest age and position Preterm Labor precautions and fetal kick counts Follow up in Office for prenatal visits and recheck of FHR Encouraged to return here or to other Urgent Care/ED if she develops worsening of symptoms, increase in pain, fever, or other concerning symptoms.   Pt stable at time of discharge.  Wynelle BourgeoisMarie Marrian Bells CNM, MSN Certified Nurse-Midwife 06/22/2017 12:25 AM

## 2017-06-21 NOTE — MAU Note (Signed)
Pt reports HA and no movement since Thursday. FHT 160 in triage. States she has taken tylenol for HA-does not help. Last took a dose on Friday. Reports HA now-rates 4/10. Denies vision changes or RUQ.

## 2017-06-22 DIAGNOSIS — G44209 Tension-type headache, unspecified, not intractable: Secondary | ICD-10-CM

## 2017-06-22 DIAGNOSIS — O368120 Decreased fetal movements, second trimester, not applicable or unspecified: Secondary | ICD-10-CM

## 2017-06-22 DIAGNOSIS — O9989 Other specified diseases and conditions complicating pregnancy, childbirth and the puerperium: Secondary | ICD-10-CM

## 2017-06-22 NOTE — Discharge Instructions (Signed)
Second Trimester of Pregnancy The second trimester is from week 13 through week 28, month 4 through 6. This is often the time in pregnancy that you feel your best. Often times, morning sickness has lessened or quit. You may have more energy, and you may get hungry more often. Your unborn baby (fetus) is growing rapidly. At the end of the sixth month, he or she is about 9 inches long and weighs about 1 pounds. You will likely feel the baby move (quickening) between 18 and 20 weeks of pregnancy. Follow these instructions at home:  Avoid all smoking, herbs, and alcohol. Avoid drugs not approved by your doctor.  Do not use any tobacco products, including cigarettes, chewing tobacco, and electronic cigarettes. If you need help quitting, ask your doctor. You may get counseling or other support to help you quit.  Only take medicine as told by your doctor. Some medicines are safe and some are not during pregnancy.  Exercise only as told by your doctor. Stop exercising if you start having cramps.  Eat regular, healthy meals.  Wear a good support bra if your breasts are tender.  Do not use hot tubs, steam rooms, or saunas.  Wear your seat belt when driving.  Avoid raw meat, uncooked cheese, and liter boxes and soil used by cats.  Take your prenatal vitamins.  Take 1500-2000 milligrams of calcium daily starting at the 20th week of pregnancy until you deliver your baby.  Try taking medicine that helps you poop (stool softener) as needed, and if your doctor approves. Eat more fiber by eating fresh fruit, vegetables, and whole grains. Drink enough fluids to keep your pee (urine) clear or pale yellow.  Take warm water baths (sitz baths) to soothe pain or discomfort caused by hemorrhoids. Use hemorrhoid cream if your doctor approves.  If you have puffy, bulging veins (varicose veins), wear support hose. Raise (elevate) your feet for 15 minutes, 3-4 times a day. Limit salt in your diet.  Avoid heavy  lifting, wear low heals, and sit up straight.  Rest with your legs raised if you have leg cramps or low back pain.  Visit your dentist if you have not gone during your pregnancy. Use a soft toothbrush to brush your teeth. Be gentle when you floss.  You can have sex (intercourse) unless your doctor tells you not to.  Go to your doctor visits. Get help if:  You feel dizzy.  You have mild cramps or pressure in your lower belly (abdomen).  You have a nagging pain in your belly area.  You continue to feel sick to your stomach (nauseous), throw up (vomit), or have watery poop (diarrhea).  You have bad smelling fluid coming from your vagina.  You have pain with peeing (urination). Get help right away if:  You have a fever.  You are leaking fluid from your vagina.  You have spotting or bleeding from your vagina.  You have severe belly cramping or pain.  You lose or gain weight rapidly.  You have trouble catching your breath and have chest pain.  You notice sudden or extreme puffiness (swelling) of your face, hands, ankles, feet, or legs.  You have not felt the baby move in over an hour.  You have severe headaches that do not go away with medicine.  You have vision changes. This information is not intended to replace advice given to you by your health care provider. Make sure you discuss any questions you have with your health care  provider. Document Released: 03/24/2009 Document Revised: 06/05/2015 Document Reviewed: 02/29/2012 Elsevier Interactive Patient Education  2017 Else[employer] General Headache Without Cause A headache is pain or discomfort felt around the head or neck area. There are many causes and types of headaches. In some cases, the cause may not be found. Follow these instructions at home: Managing pain  Take over-the-counter and prescription medicines only as told by your doctor.  Lie down in a dark, quiet room when you have a headache.  If directed,  apply ice to the head and neck area: ? Put ice in a plastic bag. ? Place a towel between your skin and the bag. ? Leave the ice on for 20 minutes, 2-3 times per day.  Use a heating pad or hot shower to apply heat to the head and neck area as told by your doctor.  Keep lights dim if bright lights bother you or make your headaches worse. Eating and drinking  Eat meals on a regular schedule.  Lessen how much alcohol you drink.  Lessen how much caffeine you drink, or stop drinking caffeine. General instructions  Keep all follow-up visits as told by your doctor. This is important.  Keep a journal to find out if certain things bring on headaches. For example, write down: ? What you eat and drink. ? How much sleep you get. ? Any change to your diet or medicines.  Relax by getting a massage or doing other relaxing activities.  Lessen stress.  Sit up straight. Do not tighten (tense) your muscles.  Do not use tobacco products. This includes cigarettes, chewing tobacco, or e-cigarettes. If you need help quitting, ask your doctor.  Exercise regularly as told by your doctor.  Get enough sleep. This often means 7-9 hours of sleep. Contact a doctor if:  Your symptoms are not helped by medicine.  You have a headache that feels different than the other headaches.  You feel sick to your stomach (nauseous) or you throw up (vomit).  You have a fever. Get help right away if:  Your headache becomes really bad.  You keep throwing up.  You have a stiff neck.  You have trouble seeing.  You have trouble speaking.  You have pain in the eye or ear.  Your muscles are weak or you lose muscle control.  You lose your balance or have trouble walking.  You feel like you will pass out (faint) or you pass out.  You have confusion. This information is not intended to replace advice given to you by your health care provider. Make sure you discuss any questions you have with your health  care provider. Document Released: 10/07/2007 Document Revised: 06/05/2015 Document Reviewed: 04/22/2014 Elsevier Interactive Patient Education  [employer]2018 [employer].

## 2017-07-04 ENCOUNTER — Encounter: Payer: Self-pay | Admitting: *Deleted

## 2017-07-13 ENCOUNTER — Ambulatory Visit (INDEPENDENT_AMBULATORY_CARE_PROVIDER_SITE_OTHER): Payer: Medicaid Other | Admitting: Certified Nurse Midwife

## 2017-07-13 ENCOUNTER — Encounter: Payer: Self-pay | Admitting: Certified Nurse Midwife

## 2017-07-13 VITALS — BP 126/80 | HR 105 | Wt 219.8 lb

## 2017-07-13 DIAGNOSIS — R011 Cardiac murmur, unspecified: Secondary | ICD-10-CM

## 2017-07-13 DIAGNOSIS — O99210 Obesity complicating pregnancy, unspecified trimester: Secondary | ICD-10-CM

## 2017-07-13 DIAGNOSIS — E559 Vitamin D deficiency, unspecified: Secondary | ICD-10-CM

## 2017-07-13 DIAGNOSIS — Z3400 Encounter for supervision of normal first pregnancy, unspecified trimester: Secondary | ICD-10-CM

## 2017-07-13 NOTE — Progress Notes (Signed)
ROB w/ ZO:[id number]:cramping

## 2017-07-13 NOTE — Progress Notes (Signed)
   PRENATAL VISIT NOTE  Subjective:  Jean Solis is a 22 y.o. G1P0000 at 9929w6d being seen today for ongoing prenatal care.  She is currently monitored for the following issues for this low-risk pregnancy and has Eczema; Acne; High risk heterosexual behavior; Supervision of normal first pregnancy, antepartum; Obesity affecting pregnancy, antepartum; Elevated hemoglobin A1c; Vitamin D deficiency; Obesity (BMI 30.0-34.9); Cardiac murmur, previously undiagnosed; and Back pain affecting pregnancy in second trimester on their problem list.  Patient reports no complaints.  Contractions: Not present. Vag. Bleeding: None.  Movement: Present. Denies leaking of fluid.   The following portions of the patient's history were reviewed and updated as appropriate: allergies, current medications, past family history, past medical history, past social history, past surgical history and problem list. Problem list updated.  Objective:   Vitals:   07/13/17 1341  BP: 126/80  Pulse: (!) 105  Weight: 219 lb 12.8 oz (99.7 kg)    Fetal Status: Fetal Heart Rate (bpm): 160; doppler Fundal Height: 27 cm Movement: Present     General:  Alert, oriented and cooperative. Patient is in no acute distress.  Skin: Skin is warm and dry. No rash noted.   Cardiovascular: Normal heart rate noted  Respiratory: Normal respiratory effort, no problems with respiration noted  Abdomen: Soft, gravid, appropriate for gestational age.  Pain/Pressure: Present     Pelvic: Cervical exam deferred        Extremities: Normal range of motion.  Edema: None  Mental Status: Normal mood and affect. Normal behavior. Normal judgment and thought content.   Assessment and Plan:  Pregnancy: G1P0000 at 2629w6d  1. Supervision of normal first pregnancy, antepartum     Doing well  2. Vitamin D deficiency     Taking weekly vitamin D  3. Obesity affecting pregnancy, antepartum      Has gained 6lbs this pregnancy.   4. Cardiac murmur, previously  undiagnosed     Declined to see cardiology d/t cost was previously scheduled in May.   Preterm labor symptoms and general obstetric precautions including but not limited to vaginal bleeding, contractions, leaking of fluid and fetal movement were reviewed in detail with the patient. Please refer to After Visit Summary for other counseling recommendations.  Return in about 2 weeks (around 07/27/2017) for ROB, 2 hr OGTT.  No future appointments.  Roe Coombsachelle A Damone Fancher, CNM

## 2017-07-20 ENCOUNTER — Other Ambulatory Visit (HOSPITAL_COMMUNITY)
Admission: RE | Admit: 2017-07-20 | Discharge: 2017-07-20 | Disposition: A | Discharge status: Home / Self Care | Payer: No Typology Code available for payment source | Source: Ambulatory Visit | Attending: Family Medicine | Admitting: Family Medicine

## 2017-07-20 ENCOUNTER — Ambulatory Visit (INDEPENDENT_AMBULATORY_CARE_PROVIDER_SITE_OTHER): Payer: No Typology Code available for payment source | Admitting: Family Medicine

## 2017-07-20 ENCOUNTER — Encounter: Payer: Self-pay | Admitting: Family Medicine

## 2017-07-20 VITALS — BP 114/70 | HR 112 | Temp 98.3°F | Ht 65.0 in | Wt 219.8 lb

## 2017-07-20 DIAGNOSIS — N898 Other specified noninflammatory disorders of vagina: Secondary | ICD-10-CM | POA: Insufficient documentation

## 2017-07-20 DIAGNOSIS — R7303 Prediabetes: Secondary | ICD-10-CM

## 2017-07-20 DIAGNOSIS — O26892 Other specified pregnancy related conditions, second trimester: Secondary | ICD-10-CM

## 2017-07-20 DIAGNOSIS — L308 Other specified dermatitis: Secondary | ICD-10-CM

## 2017-07-20 MED ORDER — TRIAMCINOLONE ACETONIDE 0.1 % EX CREA: TOPICAL_CREAM | CUTANEOUS | 0 refills | Status: DC

## 2017-07-20 NOTE — Progress Notes (Deleted)
Patient: Jean Solis MRN: [id number] DOB: 01/09/1996 PCP: Patient, No Pcp Per     Subjective:  No chief complaint on file.   HPI: The patient is a 22 y.o. female who presents today for ***  Review of Systems  Allergies Patient has No Known Allergies.  Past Medical History Patient  has a past medical history of Chlamydia (09/2011).  Surgical History Patient  has a past surgical history that includes Tonsillectomy.  Family History Pateint's family history includes Diabetes in her maternal uncle; Hyperlipidemia in her father and mother; Hypertension in her father, maternal grandmother, and mother.  Social History Patient  reports that she has never smoked. She has never used smokeless tobacco. She reports that she does not drink alcohol or use drugs.    Objective: There were no vitals filed for this visit.  There is no height or weight on file to calculate BMI.  Physical Exam     Assessment/plan:      No follow-ups on file.     @[facility] @ 07/20/2017

## 2017-07-20 NOTE — Addendum Note (Signed)
Addended by: London SheerFRIZZELL, Alisea Matte T on: 07/20/2017 10:51 AM   Modules accepted: Orders

## 2017-07-20 NOTE — Progress Notes (Signed)
Patient: Jean Solis MRN: [id number] DOB: 1995/07/11 PCP: Orland MustardWolfe, Allison, MD     Subjective:  Chief Complaint  Patient presents with  . Establish Care    itchy patch on bottom of left leg/top of foot    HPI: The patient is a 22 y.o. female who presents today for itchy rash on left foot. This started about 2 years ago and is starting to spread. She will sometimes get on her hands as well. She has hx of eczema, but she states this does not seem similar to her eczema. She has tried cortisone over the counter and it didn't really help her much. It is very itchy. Scented lotion/body wash seems to irritate it more. Darene Lamerair also really irritated it and "burnt the area." It is no where else, but feels like trying to go to other foot because it itches, but no rash. Nothing between her toes. Will get dry and scaly at times.   She also thinks she has a vaginal yeast infection. She denies any itchiness or odor, just has more discharge. She has no odor. She denies leaking fluid. + baby moving, she is having braxton hicks. No vision changes and no current headaches. No vaginal bleeding.    Immunization History  Administered Date(s) Administered  . Tdap 12/16/2013    Pap smear:  03/2017. Normal  Std screening: 01/2017   Review of Systems  Constitutional: Negative for activity change and fatigue.  Respiratory: Negative for chest tightness and shortness of breath.   Cardiovascular: Negative for chest pain and palpitations.  Gastrointestinal: Negative for nausea and vomiting.  Genitourinary: Positive for vaginal discharge. Negative for vaginal bleeding.  Musculoskeletal: Positive for back pain. Negative for neck pain.  Skin: Positive for rash.  Neurological: Positive for headaches. Negative for dizziness.  Psychiatric/Behavioral: The patient is not nervous/anxious.     Allergies Patient has No Known Allergies.  Past Medical History Patient  has a past medical history of Chlamydia  (09/2011).  Surgical History Patient  has a past surgical history that includes Tonsillectomy.  Family History Pateint's family history includes Diabetes in her maternal uncle; Hyperlipidemia in her father and mother; Hypertension in her father, maternal grandmother, and mother.  Social History Patient  reports that she has never smoked. She has never used smokeless tobacco. She reports that she does not drink alcohol or use drugs.    Objective: Vitals:   07/20/17 0916  BP: 114/70  Pulse: (!) 112  Temp: 98.3 F (36.8 C)  TempSrc: Oral  SpO2: 98%  Weight: 219 lb 12.8 oz (99.7 kg)  Height: 5\' 5"  (1.651 m)    Body mass index is 36.58 kg/m.  Physical Exam  Constitutional:  Obese and gravid   HENT:  Right Ear: External ear normal.  Left Ear: External ear normal.  Mouth/Throat: Oropharynx is clear and moist.  Neck: Normal range of motion. Neck supple.  Cardiovascular: Normal rate, regular rhythm and normal heart sounds.  Pulmonary/Chest: Effort normal and breath sounds normal.  Abdominal: Soft. Bowel sounds are normal.  +gravid   Genitourinary: Vaginal discharge (clear in nature. ) found.  Skin:  She has hyperpigmented plaque like rash on her left anterior ankle. A few spots on top of her foot as well.   Vitals reviewed.      Assessment/plan: 1. Pre-diabetes GTT was normal in April. Discussed with her that I want to see her post partum so we can check an a1c again and just monitor this. I think if she can  lose weight post baby, she will be able to get this controlled.   2. Vaginal discharge during pregnancy, antepartum, second trimester Running cytology for candida, BV, GC/C and trichomonas.  - Cervicovaginal ancillary only; Future  3. Other eczema Doing low dose steroid cream 7 days on and then off. Low dose not contraindicated in pregnancy. Also discussed good emollients and other eczema treatment. Not to use steroid on face and wash hands good. If not better we  will biopsy post baby/send to derm.   4)  She had referral to cardiology for murmur; however, no murmur on exam today and she is asymptomatic. Likely flow murmur related to pregnancy. Will continue to monitor.   Return in about 6 months (around 01/20/2018) for pre diabetes/rash (post baby) .     Orland Mustard, MD [facility]  07/20/2017

## 2017-07-21 LAB — CERVICOVAGINAL ANCILLARY ONLY
BACTERIAL VAGINITIS: NEGATIVE
CANDIDA VAGINITIS: NEGATIVE
CHLAMYDIA, DNA PROBE: NEGATIVE
NEISSERIA GONORRHEA: NEGATIVE
TRICH (WINDOWPATH): NEGATIVE

## 2017-07-27 ENCOUNTER — Other Ambulatory Visit: Payer: No Typology Code available for payment source

## 2017-07-27 ENCOUNTER — Ambulatory Visit (INDEPENDENT_AMBULATORY_CARE_PROVIDER_SITE_OTHER): Payer: No Typology Code available for payment source | Admitting: Nurse Practitioner

## 2017-07-27 VITALS — BP 121/80 | HR 103 | Wt 221.0 lb

## 2017-07-27 DIAGNOSIS — O26843 Uterine size-date discrepancy, third trimester: Secondary | ICD-10-CM

## 2017-07-27 DIAGNOSIS — Z3400 Encounter for supervision of normal first pregnancy, unspecified trimester: Secondary | ICD-10-CM

## 2017-07-27 DIAGNOSIS — O99213 Obesity complicating pregnancy, third trimester: Secondary | ICD-10-CM

## 2017-07-27 DIAGNOSIS — Z3403 Encounter for supervision of normal first pregnancy, third trimester: Secondary | ICD-10-CM

## 2017-07-27 DIAGNOSIS — E669 Obesity, unspecified: Secondary | ICD-10-CM

## 2017-07-27 DIAGNOSIS — O26849 Uterine size-date discrepancy, unspecified trimester: Secondary | ICD-10-CM | POA: Insufficient documentation

## 2017-07-27 MED ORDER — BREAST PUMP MISC: 0 refills | Status: DC

## 2017-07-27 NOTE — Patient Instructions (Signed)
Childbirth Education Options: [facility] Department Classes:  Childbirth education classes can help you get ready for a positive parenting experience. You can also meet other expectant parents and get free stuff for your baby. Each class runs for five weeks on the same night and costs $45 for the mother-to-be and her support person. Medicaid covers the cost if you are eligible. Call [phone] to register. [facility] Childbirth Education:  [phone] or [phone] or [email or web address]  Baby & Me Class: Discuss newborn & infant parenting and family adjustment issues with other new mothers in a relaxed environment. Each week brings a new speaker or baby-centered activity. We encourage new mothers to join [place] every Thursday at 11:00am. Babies birth until crawling. No registration or fee. Daddy [employer]: This course offers Dads-to-be the tools and knowledge needed to feel confident on their journey to becoming new fathers. Experienced dads, who have been trained as coaches, teach dads-to-be how to hold, comfort, diaper, swaddle and play with their infant while being able to support the new mom as well. A class for men taught by men. $25/dad Big Brother/Big Sister: Let your children share in the joy of a new brother or sister in this special class designed just for them. Class includes discussion about how families care for babies: swaddling, holding, diapering, safety as well as how they can be helpful in their new role. This class is designed for children ages 45 to 48, but any age is welcome. Please register each child individually. $5/child  Mom Talk: This mom-led group offers support and connection to mothers as they journey through the adjustments and struggles of that sometimes overwhelming first year after the birth of a child. Tuesdays at 10:00am and Thursdays at 6:00pm. Babies welcome. No registration or fee. Breastfeeding Support Group: This group is a mother-to-mother  support circle where moms have the opportunity to share their breastfeeding experiences. A Lactation Consultant is present for questions and concerns. Meets each Tuesday at 11:00am. No fee or registration. Breastfeeding Your Baby: Learn what to expect in the first days of breastfeeding your newborn.  This class will help you feel more confident with the skills needed to begin your breastfeeding experience. Many new mothers are concerned about breastfeeding after leaving the hospital. This class will also address the most common fears and challenges about breastfeeding during the first few weeks, months and beyond. (call for fee) Comfort Techniques and Tour: This 2 hour interactive class will provide you the opportunity to learn & practice hands-on techniques that can help relieve some of the discomfort of labor and encourage your baby to rotate toward the best position for birth. You and your partner will be able to try a variety of labor positions with birth balls and rebozos as well as practice breathing, relaxation, and visualization techniques. A tour of the [facility] is included with this class. $20 per registrant and support person Childbirth Class- Weekend Option: This class is a Weekend version of our Birth & Baby series. It is designed for parents who have a difficult time fitting several weeks of classes into their schedule. It covers the care of your newborn and the basics of labor and childbirth. It also includes a [place] of [facility] and lunch. The class is held two consecutive days: beginning on Friday evening from 6:30 - 8:30 p.m. and the next day, Saturday from 9 a.m. - 4 p.m. (call for fee) Doren Custard Class: Interested in a waterbirth?  This  informational class will help you discover whether waterbirth is the right fit for you. Education about waterbirth itself, supplies you would need and how to assemble your support team is what you can  expect from this class. Some obstetrical practices require this class in order to pursue a waterbirth. (Not all obstetrical practices offer waterbirth-check with your healthcare provider.) Register only the expectant mom, but you are encouraged to bring your partner to class! Required if planning waterbirth, no fee. Infant/Child CPR: Parents, grandparents, babysitters, and friends learn Cardio-Pulmonary Resuscitation skills for infants and children. You will also learn how to treat both conscious and unconscious choking in infants and children. This Family & Friends program does not offer certification. Register each participant individually to ensure that enough mannequins are available. (Call for fee) Grandparent Love: Expecting a grandbaby? This class is for you! Learn about the latest infant care and safety recommendations and ways to support your own child as he or she transitions into the parenting role. Taught by Registered Nurses who are childbirth instructors, but most importantly...they are grandmothers too! $10/person. Childbirth Class- Natural Childbirth: This series of 5 weekly classes is for expectant parents who want to learn and practice natural methods of coping with the process of labor and childbirth. Relaxation, breathing, massage, visualization, role of the partner, and helpful positioning are highlighted. Participants learn how to be confident in their body's ability to give birth. This class will empower and help parents make informed decisions about their own care. Includes discussion that will help new parents transition into the immediate postpartum period. Maternity Care Center Tour of [facility] is included. We suggest taking this class between 25-32 weeks, but it's only a recommendation. $75 per registrant and one support person or $30 Medicaid. Childbirth Class- 3 week Series: This option of 3 weekly classes helps you and your labor partner prepare for childbirth. Newborn  care, labor & birth, cesarean birth, pain management, and comfort techniques are discussed and a Maternity Care Center Tour of [facility] is included. The class meets at the same time, on the same day of the week for 3 consecutive weeks beginning with the starting date you choose. $60 for registrant and one support person.  Marvelous Multiples: Expecting twins, triplets, or more? This class covers the differences in labor, birth, parenting, and breastfeeding issues that face multiples' parents. NICU tour is included. Led by a Certified Childbirth Educator who is the mother of twins. No fee. Caring for Baby: This class is for expectant and adoptive parents who want to learn and practice the most up-to-date newborn care for their babies. Focus is on birth through the first six weeks of life. Topics include feeding, bathing, diapering, crying, umbilical cord care, circumcision care and safe sleep. Parents learn to recognize symptoms of illness and when to call the pediatrician. Register only the mom-to-be and your partner or support person can plan to come with you! $10 per registrant and support person Childbirth Class- online option: This online class offers you the freedom to complete a Birth and Baby series in the comfort of your own home. The flexibility of this option allows you to review sections at your own pace, at times convenient to you and your support people. It includes additional video information, animations, quizzes, and extended activities. Get organized with helpful eClass tools, checklists, and trackers. Once you register online for the class, you will receive an email within a few days to accept the invitation and begin the class when the time   is right for you. The content will be available to you for 60 days. $60 for 60 days of online access for you and your support people.  Local Doulas: Natural Baby Doulas [email or web address] Tel:  [phone] [email or web address] [employer] [phone] [email or web address] The Labor Hassell Halim  (also do waterbirth tub rental) [phone] [email or web address] Triad Birth Doula [phone] [email or web address] Sacred Rhythms  [phone] [email or web address]/ [employer] (PADA) [email or web address] [place] Birth and Baby  [email or web address] Considering Waterbirth? Guide for patients at [facility] [employer]  Why consider waterbirth?  . Gentle birth for babies . Less pain medicine used in labor . May allow for passive descent/less pushing . May reduce perineal tears  . More mobility and instinctive maternal position changes . Increased maternal relaxation . Reduced blood pressure in labor  Is waterbirth safe? What are the risks of infection, drowning or other complications?  . Infection: o Very low risk (3.7 % for tub vs 4.8% for bed) o 7 in 8000 waterbirths with documented infection o Poorly cleaned equipment most common cause o Slightly lower group B strep transmission rate  . Drowning o Maternal:  - Very low risk   - Related to seizures or fainting o Newborn:  - Very low risk. No evidence of increased risk of respiratory problems in multiple large studies - Physiological protection from breathing under water - Avoid underwater birth if there are any fetal complications - Once baby's head is out of the water, keep it out.  . Birth complication o Some reports of cord trauma, but risk decreased by bringing baby to surface gradually o No evidence of increased risk of shoulder dystocia. Mothers can usually change positions faster in water than in a bed, possibly aiding the maneuvers to free the shoulder.   You must attend a Doren Custard class at [facility]  3rd Wednesday of every month from 7-9pm  [employer] by calling [phone] or online at [email or web address]  Bring [place] the certificate from the class to your prenatal appointment  Meet with a midwife at 36 weeks to see if you can still plan a waterbirth and to sign the consent.   Purchase or rent the following supplies:   Water Birth Pool (Birth Pool in a Box or [place] for instance)  (Tubs start ~$125)  Single-use disposable tub liner designed for your brand of tub  New garden hose labeled "lead-free", "suitable for drinking water",  Electric drain pump to remove water (We recommend 792 gallon per hour or greater pump.)   Separate garden hose to remove the dirty water  Fish net  Bathing suit top (optional)  Long-handled mirror (optional)  Places to purchase or rent supplies  [email or web address] for tub purchases and supplies  Waterbirthsolutions.com for tub purchases and supplies  The Labor Ladies (www.[email or web address]) $275 for tub rental/set-up & take down/kit   [employer] (http://www.fleming.com/.htm) Information regarding doulas (labor support) who provide pool rentals  Our practice has a Birth Pool in a Box tub at the hospital that you may borrow on a first-come-first-served basis. It is your responsibility to to set up, clean and break down the tub. We cannot guarantee the availability of this tub in advance. You are responsible for bringing all accessories listed above. If you do not have all necessary supplies you cannot have a waterbirth.    Things that would prevent you from having a waterbirth:  Premature, <37wks  Previous cesarean birth  Presence of thick meconium-stained fluid  Multiple gestation (Twins,  triplets, etc.)  Uncontrolled diabetes or gestational diabetes requiring medication  Hypertension requiring medication or diagnosis of pre-eclampsia  Heavy vaginal bleeding  Non-reassuring fetal  heart rate  Active infection (MRSA, etc.). Group B Strep is NOT a contraindication for  waterbirth.  If your labor has to be induced and induction method requires continuous  monitoring of the baby's heart rate  Other risks/issues identified by your obstetrical provider  Please remember that birth is unpredictable. Under certain unforeseeable circumstances your provider may advise against giving birth in the tub. These decisions will be made on a case-by-case basis and with the safety of you and your baby as our highest priority.  BENEFITS OF BREASTFEEDING Many women wonder if they should breastfeed. Research shows that breast milk contains the perfect balance of vitamins, protein and fat that your baby needs to grow. It also contains antibodies that help your baby's immune system to fight off viruses and bacteria and can reduce the risk of sudden infant death syndrome (SIDS). In addition, the colostrum (a fluid secreted from the breast in the first few days after delivery) helps your newborn's digestive system to grow and function well. Breast milk is easier to digest than formula. Also, if your baby is born preterm, breast milk can help to reduce both short- and long-term health problems. BENEFITS OF BREASTFEEDING FOR MOM . Breastfeeding causes a hormone to be released that helps the uterus to contract and return to its normal size more quickly. . It aids in postpartum weight loss, reduces risk of breast and ovarian cancer, heart disease and rheumatoid arthritis. . It decreases the amount of bleeding after the baby is born. benefits of breastfeeding for baby . Provides comfort and nutrition . Protects baby against - Obesity - Diabetes - Asthma - Childhood cancers - Heart disease - Ear infections - Diarrhea - Pneumonia - Stomach problems - Serious allergies - Skin rashes . Promotes growth and development . Reduces the risk of baby having Sudden Infant Death Syndrome (SIDS) only  breastmilk for the first 6 months . Protects baby against diseases/allergies . It's the perfect amount for tiny bellies . It restores baby's energy . Provides the best nutrition for baby . Giving water or formula can make baby more likely to get sick, decrease Mom's milk supply, make baby less content with breastfeeding Skin to Skin After delivery, the staff will place your baby on your chest. This helps with the following: . Regulates baby's temperature, breathing, heart rate and blood sugar . Increases Mom's milk supply . Promotes bonding . Keeps baby and Mom calm and decreases baby's crying Rooming In Your baby will stay in your room with you for the entire time you are in the hospital. This helps with the following: . Allows Mom to learn baby's feeding cues - Fluttering eyes - Sucking on tongue or hand - Rooting (opens mouth and turns head) - Nuzzling into the breast - Bringing hand to mouth . Allows breastfeeding on demand (when your baby is ready) . Helps baby to be calm and content . Ensures a good milk supply . Prevents complications with breastfeeding . Allows parents to learn to care for baby . Allows you to request assistance with breastfeeding Importance of a good latch . Increases milk transfer to baby - baby gets enough milk . Ensures you have enough milk for your baby . Decreases nipple soreness . Don't use pacifiers and bottles - these cause baby to suck differently than breastfeeding . Promotes continuation of breastfeeding  Risks of Formula Supplementation with Breastfeeding Giving your infant formula in addition to your breast-milk EXCEPT when medically necessary can lead to: Marland Kitchen Decreases your milk supply  . Loss of confidence in yourself for providing baby's nutrition  . Engorgement and possibly mastitis  . Asthma & allergies in the baby BREASTFEEDING FAQS How long should I breastfeed my baby? It is recommended that you provide your baby with breast milk only  for the first 6 months and then continue for the first year and longer as desired. During the first few weeks after birth, your baby will need to feed 8-12 times every 24 hours, or every 2-3 hours. They will likely feed for 15-30 minutes. How can I help my baby begin breastfeeding? Babies are born with an instinct to breastfeed. A healthy baby can begin breastfeeding right away without specific help. At the hospital, a nurse (or lactation consultant) will help you begin the process and will give you tips on good positioning. It may be helpful to take a breastfeeding class before you deliver in order to know what to expect. How can I help my baby latch on? In order to assist your baby in latching-on, cup your breast in your hand and stroke your baby's lower lip with your nipple to stimulate your baby's rooting reflex. Your baby will look like he or she is yawning, at which point you should bring the baby towards your breast, while aiming the nipple at the roof of his or her mouth. Remember to bring the baby towards you and not your breast towards the baby. How can I tell if my baby is latched-on? Your baby will have all of your nipple and part of the dark area around the nipple in his or her mouth and your baby's nose will be touching your breast. You should see or hear the baby swallowing. If the baby is not latched-on properly, start the process over. To remove the suction, insert a clean finger between your breast and the baby's mouth. Should I switch breasts during feeding? After feeding on one side, switch the baby to your other breast. If he or she does not continue feeding - that is OK. Your baby will not necessarily need to feed from both breasts in a single feeding. On the next feeding, start with the other breast for efficiency and comfort. How can I tell if my baby is hungry? When your baby is hungry, they will nuzzle against your breast, make sucking noises and tongue motions and may put their  hands near their mouth. Crying is a late sign of hunger, so you should not wait until this point. When they have received enough milk, they will unlatch from the breast. Is it okay to use a pacifier? Until your baby gets the hang of breastfeeding, experts recommend limiting pacifier usage. If you have questions about this, please contact your pediatrician. What can I do to ensure proper nutrition while breastfeeding? . Make sure that you support your own health and your baby's by eating a healthy, well-balanced diet . Your provider may recommend that you continue to take your prenatal vitamin . Drink plenty of fluids. It is a good rule to drink one glass of water before or after feeding . Alcohol will remain in the breast milk for as long as it will remain in the blood stream. If you choose to have a drink, it is recommended that you wait at least 2 hours before feeding . Moderate amounts of caffeine are  OK . Some over-the-counter or prescription medications are not recommended during breastfeeding. Check with your provider if you have questions What types of birth control methods are safe while breastfeeding? Progestin-only methods, including a daily pill, an IUD, the implant and the injection are safe while breastfeeding. Methods that contain estrogen (such as combination birth control pills, the vaginal ring and the patch) should not be used during the first month of breastfeeding as these can decrease your milk supply.

## 2017-07-27 NOTE — Progress Notes (Signed)
Patient reports good fetal movement with some uterine irritability. 

## 2017-07-27 NOTE — Progress Notes (Signed)
    Subjective:  Jean Solis is a 22 y.o. G1P0000 at 2426w6d being seen today for ongoing prenatal care.  She is currently monitored for the following issues for this low-risk pregnancy and has Eczema; Acne; High risk heterosexual behavior; Supervision of normal first pregnancy, antepartum; Obesity affecting pregnancy, antepartum; Vitamin D deficiency; Obesity (BMI 30.0-34.9); Back pain affecting pregnancy in second trimester; and Pre-diabetes on their problem list.  Patient reports no complaints.  Contractions: Irritability. Vag. Bleeding: None.  Movement: Present. Denies leaking of fluid.   The following portions of the patient's history were reviewed and updated as appropriate: allergies, current medications, past family history, past medical history, past social history, past surgical history and problem list. Problem list updated.  Objective:   Vitals:   07/27/17 0921  BP: 121/80  Pulse: (!) 103  Weight: 221 lb (100.2 kg)    Fetal Status: Fetal Heart Rate (bpm): 150 Fundal Height: 32 cm Movement: Present     General:  Alert, oriented and cooperative. Patient is in no acute distress.  Skin: Skin is warm and dry. No rash noted.   Cardiovascular: Normal heart rate noted  Respiratory: Normal respiratory effort, no problems with respiration noted  Abdomen: Soft, gravid, appropriate for gestational age. Pain/Pressure: Absent     Pelvic:  Cervical exam deferred        Extremities: Normal range of motion.  Edema: None  Mental Status: Normal mood and affect. Normal behavior. Normal judgment and thought content.   Urinalysis:      Assessment and Plan:  Pregnancy: G1P0000 at 5826w6d  1. Supervision of normal first pregnancy, antepartum Encouraged to attend childbirth classes and water birth class if interested  - Glucose Tolerance, 2 Hours w/1 Hour - RPR - CBC - HIV antibody (with reflex)  2. Obesity (BMI 30.0-34.9) Weigh gain is within expected parameters  3. Uterine size date  discrepancy pregnancy, third trimester Measuring more than dates - will watch and decide at next visit whether US is needed  Preterm labor symptoms and general obstetric precautions including but not limited to vaginal bleeding, contractions, leaking of fluid and fetal movement were reviewed in detail with the patient. Please refer to After Visit Summary for other counseling recommendations.  Return in about 2 weeks (around 08/10/2017).  Nolene BernheimERRI BURLESON, RN, MSN, NP-BC Nurse Practitioner, [facility], [facility] 07/27/2017 10:06 AM

## 2017-07-28 LAB — CBC
HEMATOCRIT: 38.4 % (ref 34.0–46.6)
Hemoglobin: 12.5 g/dL (ref 11.1–15.9)
MCH: 26.6 pg (ref 26.6–33.0)
MCHC: 32.6 g/dL (ref 31.5–35.7)
MCV: 82 fL (ref 79–97)
PLATELETS: 247 10*3/uL (ref 150–450)
RBC: 4.7 x10E6/uL (ref 3.77–5.28)
RDW: 16.2 % — AB (ref 12.3–15.4)
WBC: 9.7 10*3/uL (ref 3.4–10.8)

## 2017-07-28 LAB — HIV ANTIBODY (ROUTINE TESTING W REFLEX): HIV Screen 4th Generation wRfx: NONREACTIVE

## 2017-07-28 LAB — RPR: RPR: NONREACTIVE

## 2017-07-28 LAB — GLUCOSE TOLERANCE, 2 HOURS W/ 1HR
GLUCOSE, 1 HOUR: 117 mg/dL (ref 65–179)
GLUCOSE, FASTING: 84 mg/dL (ref 65–91)
Glucose, 2 hour: 88 mg/dL (ref 65–152)

## 2017-08-10 ENCOUNTER — Other Ambulatory Visit: Payer: Self-pay

## 2017-08-10 ENCOUNTER — Ambulatory Visit (INDEPENDENT_AMBULATORY_CARE_PROVIDER_SITE_OTHER): Payer: No Typology Code available for payment source | Admitting: Advanced Practice Midwife

## 2017-08-10 VITALS — BP 109/77 | HR 105 | Wt 217.5 lb

## 2017-08-10 DIAGNOSIS — Z3403 Encounter for supervision of normal first pregnancy, third trimester: Secondary | ICD-10-CM

## 2017-08-10 DIAGNOSIS — O26843 Uterine size-date discrepancy, third trimester: Secondary | ICD-10-CM

## 2017-08-10 DIAGNOSIS — Z8739 Personal history of other diseases of the musculoskeletal system and connective tissue: Secondary | ICD-10-CM

## 2017-08-10 DIAGNOSIS — Z3400 Encounter for supervision of normal first pregnancy, unspecified trimester: Secondary | ICD-10-CM

## 2017-08-10 DIAGNOSIS — Z23 Encounter for immunization: Secondary | ICD-10-CM | POA: Diagnosis not present

## 2017-08-10 NOTE — Progress Notes (Signed)
   PRENATAL VISIT NOTE  Subjective:  Jean Solis is a 22 y.o. G1P0000 at 6734w6d being seen today for ongoing prenatal care.  She is currently monitored for the following issues for this low-risk pregnancy and has Eczema; Acne; High risk heterosexual behavior; Supervision of normal first pregnancy, antepartum; Obesity affecting pregnancy, antepartum; Vitamin D deficiency; Obesity (BMI 30.0-34.9); Back pain affecting pregnancy in second trimester; Pre-diabetes; and Uterine size date discrepancy pregnancy, third trimester on their problem list.  Patient reports no complaints.  Contractions: Irritability. Vag. Bleeding: None.  Movement: Present. Denies leaking of fluid.   The following portions of the patient's history were reviewed and updated as appropriate: allergies, current medications, past family history, past medical history, past social history, past surgical history and problem list. Problem list updated.  Objective:   Vitals:   08/10/17 1130  BP: 109/77  Pulse: (!) 105  Weight: 217 lb 8 oz (98.7 kg)    Fetal Status: Fetal Heart Rate (bpm): 150 Fundal Height: 30 cm Movement: Present     General:  Alert, oriented and cooperative. Patient is in no acute distress.  Skin: Skin is warm and dry. No rash noted.   Cardiovascular: Normal heart rate noted  Respiratory: Normal respiratory effort, no problems with respiration noted  Abdomen: Soft, gravid, appropriate for gestational age.  Pain/Pressure: Absent     Pelvic: Cervical exam deferred        Extremities: Normal range of motion.  Edema: None  Mental Status: Normal mood and affect. Normal behavior. Normal judgment and thought content.   Assessment and Plan:  Pregnancy: G1P0000 at 8034w6d  1. Supervision of normal first pregnancy, antepartum --Anticipatory guidance about next visits/weeks of pregnancy given. - Genetic Screening --Interested in waterbirth so information given today.  Registered for class.  2. History of  intervertebral disc disorder --Need to obtain records for anesthesia, has been told she cannot have epidural.  --Plans natural birth but recommend obtaining records for anesthesia to have all options available.  3. Uterine size date discrepancy pregnancy, third trimester --Fundal height c/w dates today, will continue to monitor.  Preterm labor symptoms and general obstetric precautions including but not limited to vaginal bleeding, contractions, leaking of fluid and fetal movement were reviewed in detail with the patient. Please refer to After Visit Summary for other counseling recommendations.  Return in about 2 weeks (around 08/24/2017).  Future Appointments  Date Time Provider Department Center  08/24/2017  4:15 PM Reva BoresPratt, Tanya S, MD [facility] None    Sharen CounterLisa Leftwich-Kirby, CNM

## 2017-08-10 NOTE — Patient Instructions (Addendum)
Lactation at [facility]: [phone] or PhoneCaptions.[email or web address]  Considering Doren Custard? Guide for patients at [facility] [employer]  Why consider waterbirth?  . Gentle birth for babies . Less pain medicine used in labor . May allow for passive descent/less pushing . May reduce perineal tears  . More mobility and instinctive maternal position changes . Increased maternal relaxation . Reduced blood pressure in labor  Is waterbirth safe? What are the risks of infection, drowning or other complications?  . Infection: o Very low risk (3.7 % for tub vs 4.8% for bed) o 7 in 8000 waterbirths with documented infection o Poorly cleaned equipment most common cause o Slightly lower group B strep transmission rate  . Drowning o Maternal:  - Very low risk   - Related to seizures or fainting o Newborn:  - Very low risk. No evidence of increased risk of respiratory problems in multiple large studies - Physiological protection from breathing under water - Avoid underwater birth if there are any fetal complications - Once baby's head is out of the water, keep it out.  . Birth complication o Some reports of cord trauma, but risk decreased by bringing baby to surface gradually o No evidence of increased risk of shoulder dystocia. Mothers can usually change positions faster in water than in a bed, possibly aiding the maneuvers to free the shoulder.   You must attend a Doren Custard class at [facility]  3rd Wednesday of every month from 7-9pm  [employer] by calling [phone] or online at [email or web address]  Bring [place] the certificate from the class to your prenatal appointment  Meet with a midwife at 36 weeks to see if you can still plan a waterbirth and to sign the consent.   Purchase or rent the following supplies:   Water Birth Pool (Birth Pool in a Box or Griffin for instance)  (Tubs start ~$125)  Single-use  disposable tub liner designed for your brand of tub  New garden hose labeled "lead-free", "suitable for drinking water",  Electric drain pump to remove water (We recommend 792 gallon per hour or greater pump.)   Separate garden hose to remove the dirty water  Fish net  Bathing suit top (optional)  Long-handled mirror (optional)  Places to purchase or rent supplies  GotWebTools.is for tub purchases and supplies  Waterbirthsolutions.com for tub purchases and supplies  The Labor Ladies (www.thelaborladies.com) $275 for tub rental/set-up & take down/kit   [employer] (http://www.fleming.com/.htm) Information regarding doulas (labor support) who provide pool rentals  Our practice has a Birth Pool in a Box tub at the hospital that you may borrow on a first-come-first-served basis. It is your responsibility to to set up, clean and break down the tub. We cannot guarantee the availability of this tub in advance. You are responsible for bringing all accessories listed above. If you do not have all necessary supplies you cannot have a waterbirth.    Things that would prevent you from having a waterbirth:  Premature, <37wks  Previous cesarean birth  Presence of thick meconium-stained fluid  Multiple gestation (Twins, triplets, etc.)  Uncontrolled diabetes or gestational diabetes requiring medication  Hypertension requiring medication or diagnosis of pre-eclampsia  Heavy vaginal bleeding  Non-reassuring fetal heart rate  Active infection (MRSA, etc.). Group B Strep is NOT a contraindication for  waterbirth.  If your labor has to be induced and induction method requires continuous  monitoring of the baby's heart rate  Other risks/issues identified by your obstetrical provider  Please remember that birth is unpredictable. Under certain unforeseeable circumstances your provider may advise against giving birth in the tub. These decisions will be made on a  case-by-case basis and with the safety of you and your baby as our highest priority.

## 2017-08-10 NOTE — Progress Notes (Signed)
ROB. TDAP given in right deltoid, tolerated well.

## 2017-08-15 ENCOUNTER — Encounter: Payer: Self-pay | Admitting: Advanced Practice Midwife

## 2017-08-17 ENCOUNTER — Encounter (INDEPENDENT_AMBULATORY_CARE_PROVIDER_SITE_OTHER): Payer: Self-pay

## 2017-08-24 ENCOUNTER — Ambulatory Visit (INDEPENDENT_AMBULATORY_CARE_PROVIDER_SITE_OTHER): Payer: No Typology Code available for payment source | Admitting: Family Medicine

## 2017-08-24 VITALS — BP 108/72 | HR 97 | Wt 220.0 lb

## 2017-08-24 DIAGNOSIS — Z3400 Encounter for supervision of normal first pregnancy, unspecified trimester: Secondary | ICD-10-CM

## 2017-08-24 NOTE — Patient Instructions (Signed)

## 2017-08-24 NOTE — Progress Notes (Signed)
   PRENATAL VISIT NOTE  Subjective:  Jean Solis is a 22 y.o. G1P0000 at 6352w6d being seen today for ongoing prenatal care.  She is currently monitored for the following issues for this low-risk pregnancy and has Eczema; Supervision of normal first pregnancy, antepartum; Obesity affecting pregnancy, antepartum; Vitamin D deficiency; Obesity (BMI 30.0-34.9); Back pain affecting pregnancy in second trimester; Pre-diabetes; and Uterine size date discrepancy pregnancy, third trimester on their problem list.  Patient reports no complaints.  Contractions: Irritability. Vag. Bleeding: None.  Movement: Present. Denies leaking of fluid.   The following portions of the patient's history were reviewed and updated as appropriate: allergies, current medications, past family history, past medical history, past social history, past surgical history and problem list. Problem list updated.  Objective:   Vitals:   08/24/17 1621  BP: 108/72  Pulse: 97  Weight: 220 lb (99.8 kg)    Fetal Status:     Movement: Present     General:  Alert, oriented and cooperative. Patient is in no acute distress.  Skin: Skin is warm and dry. No rash noted.   Cardiovascular: Normal heart rate noted  Respiratory: Normal respiratory effort, no problems with respiration noted  Abdomen: Soft, gravid, appropriate for gestational age.  Pain/Pressure: Absent     Pelvic: Cervical exam deferred        Extremities: Normal range of motion.  Edema: None  Mental Status: Normal mood and affect. Normal behavior. Normal judgment and thought content.   Assessment and Plan:  Pregnancy: G1P0000 at 2352w6d  1. Supervision of normal first pregnancy, antepartum Continue routine prenatal care.   Preterm labor symptoms and general obstetric precautions including but not limited to vaginal bleeding, contractions, leaking of fluid and fetal movement were reviewed in detail with the patient. Please refer to After Visit Summary for other  counseling recommendations.  Return in 2 weeks (on 09/07/2017).  Future Appointments  Date Time Provider Department Center  09/07/2017  8:45 AM Marny LowensteinWenzel, Julie N, PA-C [facility] None    Reva Boresanya S Nishita Isaacks, MD

## 2017-09-01 ENCOUNTER — Inpatient Hospital Stay (HOSPITAL_COMMUNITY)
Admission: AD | Admit: 2017-09-01 | Discharge: 2017-09-01 | Disposition: A | Discharge status: Home / Self Care | Payer: No Typology Code available for payment source | Source: Ambulatory Visit | Attending: Obstetrics and Gynecology | Admitting: Obstetrics and Gynecology

## 2017-09-01 ENCOUNTER — Encounter (HOSPITAL_COMMUNITY): Payer: Self-pay

## 2017-09-01 ENCOUNTER — Other Ambulatory Visit: Payer: Self-pay

## 2017-09-01 DIAGNOSIS — O99210 Obesity complicating pregnancy, unspecified trimester: Secondary | ICD-10-CM

## 2017-09-01 DIAGNOSIS — O26893 Other specified pregnancy related conditions, third trimester: Secondary | ICD-10-CM | POA: Diagnosis not present

## 2017-09-01 DIAGNOSIS — Z0371 Encounter for suspected problem with amniotic cavity and membrane ruled out: Secondary | ICD-10-CM

## 2017-09-01 DIAGNOSIS — O99213 Obesity complicating pregnancy, third trimester: Secondary | ICD-10-CM | POA: Insufficient documentation

## 2017-09-01 DIAGNOSIS — E669 Obesity, unspecified: Secondary | ICD-10-CM | POA: Insufficient documentation

## 2017-09-01 DIAGNOSIS — Z3A34 34 weeks gestation of pregnancy: Secondary | ICD-10-CM | POA: Diagnosis not present

## 2017-09-01 DIAGNOSIS — N898 Other specified noninflammatory disorders of vagina: Secondary | ICD-10-CM | POA: Insufficient documentation

## 2017-09-01 DIAGNOSIS — Z3400 Encounter for supervision of normal first pregnancy, unspecified trimester: Secondary | ICD-10-CM

## 2017-09-01 LAB — URINALYSIS, ROUTINE W REFLEX MICROSCOPIC
BILIRUBIN URINE: NEGATIVE
Glucose, UA: NEGATIVE mg/dL
HGB URINE DIPSTICK: NEGATIVE
Ketones, ur: NEGATIVE mg/dL
NITRITE: NEGATIVE
Protein, ur: 100 mg/dL — AB
SPECIFIC GRAVITY, URINE: 1.02 (ref 1.005–1.030)
pH: 7 (ref 5.0–8.0)

## 2017-09-01 LAB — AMNISURE RUPTURE OF MEMBRANE (ROM) NOT AT ARMC: AMNISURE: NEGATIVE

## 2017-09-01 NOTE — MAU Note (Signed)
Urine in lab 

## 2017-09-01 NOTE — Discharge Instructions (Signed)

## 2017-09-01 NOTE — MAU Provider Note (Addendum)
History     CSN: [id number]  Arrival date and time: 09/01/17 1419  Chief Complaint  Patient presents with  . Rupture of Membranes  . Contractions    HPI Jean Solis is a 22 y.o. G1P0000 at [redacted]w[redacted]d who presents with ongoing vaginal leaking. She states she told the office at her last prenatal visit that she was having leaking and was told to be seen at MAU if it continued. She states she feels like her underwear are constantly wet. She denies any pain. Reports normal fetal movement.   OB History    Gravida  1   Para  0   Term  0   Preterm  0   AB  0   Living  0     SAB  0   TAB  0   Ectopic  0   Multiple  0   Live Births  0           Past Medical History:  Diagnosis Date  . Chlamydia 09/2011    Past Surgical History:  Procedure Laterality Date  . BARTHOLIN CYST MARSUPIALIZATION Right 2012  . TONSILLECTOMY      Family History  Problem Relation Age of Onset  . Hypertension Mother   . Hyperlipidemia Mother   . Hyperlipidemia Father   . Hypertension Father   . Hypertension Maternal Grandmother   . Diabetes Maternal Uncle     Social History   Tobacco Use  . Smoking status: Never Smoker  . Smokeless tobacco: Never Used  Substance Use Topics  . Alcohol use: No    Frequency: Never    Comment: No alcohol since pregnancy  . Drug use: No    Allergies: No Known Allergies  Medications Prior to Admission  Medication Sig Dispense Refill Last Dose  . Misc. Devices (BREAST PUMP) MISC Electric double pump - Breast pump for client to use while breastfeeding. 1 each 0 Taking  . Prenatal Vit-Fe Fumarate-FA (PRENATAL MULTIVITAMIN) TABS tablet Take 1 tablet by mouth daily at 12 noon.   Taking  . Prenatal-Fe Fum-Methf-FA w/o A (VITAFOL-NANO) 18-0.6-0.4 MG TABS Take 1 tablet by mouth daily. 30 tablet 12 Taking  . triamcinolone cream (KENALOG) 0.1 % Use twice a day for 7 days then off for 7 days 80 g 0 Taking  . Vitamin D, Ergocalciferol, (DRISDOL) 50000  units CAPS capsule Take 1 capsule (50,000 Units total) by mouth every 7 (seven) days. 30 capsule 2 Taking    Review of Systems  Constitutional: Negative.  Negative for fatigue and fever.  HENT: Negative.   Respiratory: Negative.  Negative for shortness of breath.   Cardiovascular: Negative.  Negative for chest pain.  Gastrointestinal: Negative.  Negative for abdominal pain, constipation, diarrhea, nausea and vomiting.  Genitourinary: Positive for vaginal discharge. Negative for dysuria and vaginal bleeding.  Neurological: Negative.  Negative for dizziness and headaches.   Physical Exam   Blood pressure 118/71, pulse 96, temperature 98.4 F (36.9 C), temperature source Oral, resp. rate 16, weight 99.3 kg, last menstrual period 01/04/2017, SpO2 100 %.  Physical Exam  Nursing note and vitals reviewed. Constitutional: She is oriented to person, place, and time. She appears well-developed and well-nourished. No distress.  HENT:  Head: Normocephalic.  Eyes: Pupils are equal, round, and reactive to light.  Cardiovascular: Normal rate, regular rhythm and normal heart sounds.  Respiratory: Effort normal and breath sounds normal. No respiratory distress.  GI: Soft. Bowel sounds are normal. She exhibits no distension. There is  no tenderness.  Genitourinary: Vaginal discharge (creamy white discharge) found.  Neurological: She is alert and oriented to person, place, and time.  Skin: Skin is warm and dry.  Psychiatric: She has a normal mood and affect. Her behavior is normal. Judgment and thought content normal.   Fetal Tracing:  Baseline: 125 Variability: moderate Accels: 15x15 Decels: none  Toco: none  MAU Course  Procedures Results for orders placed or performed during the hospital encounter of 09/01/17 (from the past 24 hour(s))  Urinalysis, Routine w reflex microscopic     Status: Abnormal   Collection Time: 09/01/17  3:07 PM  Result Value Ref Range   Color, Urine YELLOW YELLOW    APPearance HAZY (A) CLEAR   Specific Gravity, Urine 1.020 1.005 - 1.030   pH 7.0 5.0 - 8.0   Glucose, UA NEGATIVE NEGATIVE mg/dL   Hgb urine dipstick NEGATIVE NEGATIVE   Bilirubin Urine NEGATIVE NEGATIVE   Ketones, ur NEGATIVE NEGATIVE mg/dL   Protein, ur 161100 (A) NEGATIVE mg/dL   Nitrite NEGATIVE NEGATIVE   Leukocytes, UA TRACE (A) NEGATIVE   RBC / HPF 0-5 0 - 5 RBC/hpf   WBC, UA 11-20 0 - 5 WBC/hpf   Bacteria, UA RARE (A) NONE SEEN   Squamous Epithelial / LPF 0-5 0 - 5   Mucus PRESENT   Amnisure rupture [facility] membrane (rom)[facility]     Status: None   Collection Time: 09/01/17  4:09 PM  Result Value Ref Range   Amnisure ROM NEGATIVE    MDM UA Amnisure  Assessment and Plan   1. Encounter for suspected premature rupture of amniotic membranes, with rupture of membranes not found   2. Obesity affecting pregnancy, antepartum   3. Supervision of normal first pregnancy, antepartum   4. Vaginal discharge during pregnancy in third trimester   5. [redacted] weeks gestation of pregnancy    -Discharge home in stable condition -Preterm labor precautions discussed -Patient advised to follow-up with [facility] as scheduled for prenatal care -Patient may return to MAU as needed or if her condition were to change or worsen   Rolm BookbinderCaroline M Neill CNM 09/01/2017, 4:22 PM

## 2017-09-01 NOTE — MAU Note (Signed)
Was told to come back if continued to have fluid. Intermittent wet spots.  No bleeding, has had occasional contractions.

## 2017-09-07 ENCOUNTER — Encounter: Payer: No Typology Code available for payment source | Admitting: Medical

## 2017-09-08 ENCOUNTER — Ambulatory Visit (INDEPENDENT_AMBULATORY_CARE_PROVIDER_SITE_OTHER): Payer: No Typology Code available for payment source | Admitting: Obstetrics and Gynecology

## 2017-09-08 VITALS — BP 121/77 | HR 97 | Wt 217.8 lb

## 2017-09-08 DIAGNOSIS — Z3400 Encounter for supervision of normal first pregnancy, unspecified trimester: Secondary | ICD-10-CM

## 2017-09-08 NOTE — Progress Notes (Signed)
Pt has no complaints

## 2017-09-09 NOTE — Progress Notes (Signed)
Prenatal Visit Note Date: 09/08/2017 Clinic: [facility]  Subjective:  Jean Solis is a 22 y.o. G1P0000 at 4144w0d being seen today for ongoing prenatal care.  She is currently monitored for the following issues for this low-risk pregnancy and has Eczema; Supervision of normal first pregnancy, antepartum; Obesity affecting pregnancy, antepartum; Vitamin D deficiency; Obesity (BMI 30.0-34.9); Pre-diabetes; and Uterine size date discrepancy pregnancy, third trimester on their problem list.  Patient reports no complaints.   Contractions: Not present. Vag. Bleeding: None.  Movement: Present. Denies leaking of fluid.   The following portions of the patient's history were reviewed and updated as appropriate: allergies, current medications, past family history, past medical history, past social history, past surgical history and problem list. Problem list updated.  Objective:   Vitals:   09/08/17 1623  BP: 121/77  Pulse: 97  Weight: 217 lb 12.8 oz (98.8 kg)    Fetal Status: Fetal Heart Rate (bpm): 140s Fundal Height: 34 cm Movement: Present  Presentation: Vertex  General:  Alert, oriented and cooperative. Patient is in no acute distress.  Skin: Skin is warm and dry. No rash noted.   Cardiovascular: Normal heart rate noted  Respiratory: Normal respiratory effort, no problems with respiration noted  Abdomen: Soft, gravid, appropriate for gestational age. Pain/Pressure: Absent     Pelvic:  Cervical exam deferred        Extremities: Normal range of motion.  Edema: None  Mental Status: Normal mood and affect. Normal behavior. Normal judgment and thought content.   Urinalysis:      Assessment and Plan:  Pregnancy: G1P0000 at 4944w0d  1. Supervision of normal first pregnancy, antepartum Routine care. Pt brought waterbirth completion certificate with her today, will have front desk scan it in. GBS nv  Preterm labor symptoms and general obstetric precautions including but not limited to vaginal  bleeding, contractions, leaking of fluid and fetal movement were reviewed in detail with the patient. Please refer to After Visit Summary for other counseling recommendations.  Return in about 1 week (around 09/15/2017) for 7-10d rob.   [place] BingPickens, Nayali Talerico, MD

## 2017-09-19 ENCOUNTER — Other Ambulatory Visit (HOSPITAL_COMMUNITY)
Admission: RE | Admit: 2017-09-19 | Discharge: 2017-09-19 | Disposition: A | Discharge status: Home / Self Care | Payer: No Typology Code available for payment source | Source: Ambulatory Visit | Attending: Obstetrics | Admitting: Obstetrics

## 2017-09-19 ENCOUNTER — Other Ambulatory Visit: Payer: Self-pay

## 2017-09-19 ENCOUNTER — Ambulatory Visit (INDEPENDENT_AMBULATORY_CARE_PROVIDER_SITE_OTHER): Payer: No Typology Code available for payment source | Admitting: Obstetrics

## 2017-09-19 ENCOUNTER — Encounter: Payer: Self-pay | Admitting: Obstetrics

## 2017-09-19 VITALS — BP 118/68 | HR 96 | Wt 224.5 lb

## 2017-09-19 DIAGNOSIS — N898 Other specified noninflammatory disorders of vagina: Secondary | ICD-10-CM | POA: Diagnosis not present

## 2017-09-19 DIAGNOSIS — Z3400 Encounter for supervision of normal first pregnancy, unspecified trimester: Secondary | ICD-10-CM

## 2017-09-19 DIAGNOSIS — Z3403 Encounter for supervision of normal first pregnancy, third trimester: Secondary | ICD-10-CM

## 2017-09-19 LAB — OB RESULTS CONSOLE GC/CHLAMYDIA: Gonorrhea: NEGATIVE

## 2017-09-19 NOTE — Progress Notes (Signed)
ROB GBS 

## 2017-09-19 NOTE — Progress Notes (Signed)
Subjective:  Jean Solis is a 22 y.o. G1P0000 at [redacted]w[redacted]d being seen today for ongoing prenatal care.  She is currently monitored for the following issues for this low-risk pregnancy and has Eczema; Supervision of normal first pregnancy, antepartum; Obesity affecting pregnancy, antepartum; Vitamin D deficiency; Obesity (BMI 30.0-34.9); Pre-diabetes; and Uterine size date discrepancy pregnancy, third trimester on their problem list.  Patient reports no complaints.  Contractions: Irritability. Vag. Bleeding: None.  Movement: Present. Denies leaking of fluid.   The following portions of the patient's history were reviewed and updated as appropriate: allergies, current medications, past family history, past medical history, past social history, past surgical history and problem list. Problem list updated.  Objective:   Vitals:   09/19/17 1633  BP: 118/68  Pulse: 96  Weight: 224 lb 8 oz (101.8 kg)    Fetal Status: Fetal Heart Rate (bpm): 140   Movement: Present     General:  Alert, oriented and cooperative. Patient is in no acute distress.  Skin: Skin is warm and dry. No rash noted.   Cardiovascular: Normal heart rate noted  Respiratory: Normal respiratory effort, no problems with respiration noted  Abdomen: Soft, gravid, appropriate for gestational age. Pain/Pressure: Present     Pelvic:  Cervical exam deferred        Extremities: Normal range of motion.  Edema: Trace  Mental Status: Normal mood and affect. Normal behavior. Normal judgment and thought content.   Urinalysis:      Assessment and Plan:  Pregnancy: G1P0000 at [redacted]w[redacted]d  1. Supervision of normal first pregnancy, antepartum Rx: - Strep Gp B NAA  2. Vaginal discharge Rx: - Cervicovaginal ancillary only  Term labor symptoms and general obstetric precautions including but not limited to vaginal bleeding, contractions, leaking of fluid and fetal movement were reviewed in detail with the patient. Please refer to After Visit  Summary for other counseling recommendations.  Return in about 1 week (around 09/26/2017) for ROB.   Brock Bad, MD

## 2017-09-21 LAB — CERVICOVAGINAL ANCILLARY ONLY
BACTERIAL VAGINITIS: NEGATIVE
CANDIDA VAGINITIS: NEGATIVE
CHLAMYDIA, DNA PROBE: NEGATIVE
Neisseria Gonorrhea: NEGATIVE
TRICH (WINDOWPATH): NEGATIVE

## 2017-09-21 LAB — STREP GP B NAA: Strep Gp B NAA: NEGATIVE

## 2017-09-26 ENCOUNTER — Ambulatory Visit (INDEPENDENT_AMBULATORY_CARE_PROVIDER_SITE_OTHER): Payer: No Typology Code available for payment source | Admitting: Obstetrics and Gynecology

## 2017-09-26 ENCOUNTER — Encounter: Payer: Self-pay | Admitting: Obstetrics and Gynecology

## 2017-09-26 VITALS — BP 129/85 | HR 110 | Wt 221.0 lb

## 2017-09-26 DIAGNOSIS — Z3400 Encounter for supervision of normal first pregnancy, unspecified trimester: Secondary | ICD-10-CM

## 2017-09-26 DIAGNOSIS — Z3403 Encounter for supervision of normal first pregnancy, third trimester: Secondary | ICD-10-CM

## 2017-09-26 DIAGNOSIS — O99210 Obesity complicating pregnancy, unspecified trimester: Secondary | ICD-10-CM

## 2017-09-26 DIAGNOSIS — O99213 Obesity complicating pregnancy, third trimester: Secondary | ICD-10-CM

## 2017-09-26 MED ORDER — BUTALBITAL-APAP-CAFFEINE 50-325-40 MG PO TABS: 1.0000 | ORAL_TABLET | Freq: Four times a day (QID) | ORAL | 0 refills | Status: DC | PRN

## 2017-09-26 NOTE — Progress Notes (Signed)
   PRENATAL VISIT NOTE  Subjective:  Jean Solis is a 22 y.o. G1P0000 at 4431w4d being seen today for ongoing prenatal care.  She is currently monitored for the following issues for this low-risk pregnancy and has Eczema; Supervision of normal first pregnancy, antepartum; Obesity affecting pregnancy, antepartum; Vitamin D deficiency; Obesity (BMI 30.0-34.9); Pre-diabetes; and Uterine size date discrepancy pregnancy, third trimester on their problem list.  Patient reports no complaints.  Contractions: Not present. Vag. Bleeding: None.  Movement: Present. Denies leaking of fluid.   The following portions of the patient's history were reviewed and updated as appropriate: allergies, current medications, past family history, past medical history, past social history, past surgical history and problem list. Problem list updated.  Objective:   Vitals:   09/26/17 1035  BP: 129/85  Pulse: (!) 110  Weight: 221 lb (100.2 kg)    Fetal Status: Fetal Heart Rate (bpm): 140 Fundal Height: 38 cm Movement: Present  Presentation: Vertex  General:  Alert, oriented and cooperative. Patient is in no acute distress.  Skin: Skin is warm and dry. No rash noted.   Cardiovascular: Normal heart rate noted  Respiratory: Normal respiratory effort, no problems with respiration noted  Abdomen: Soft, gravid, appropriate for gestational age.  Pain/Pressure: Absent     Pelvic: Cervical exam performed Dilation: Closed Effacement (%): Thick Station: Ballotable  Extremities: Normal range of motion.     Mental Status: Normal mood and affect. Normal behavior. Normal judgment and thought content.   Assessment and Plan:  Pregnancy: G1P0000 at 1531w4d  1. Supervision of normal first pregnancy, antepartum Patient is doing well without complaints  2. Obesity affecting pregnancy, antepartum   Term labor symptoms and general obstetric precautions including but not limited to vaginal bleeding, contractions, leaking of fluid  and fetal movement were reviewed in detail with the patient. Please refer to After Visit Summary for other counseling recommendations.  Return in about 1 week (around 10/03/2017) for ROB.  Future Appointments  Date Time Provider Department Center  09/26/2017  4:00 PM Tennelle Taflinger, Gigi GinPeggy, MD [facility] None    Catalina AntiguaPeggy Serapio Edelson, MD

## 2017-10-01 ENCOUNTER — Inpatient Hospital Stay (HOSPITAL_COMMUNITY)
Admission: AD | Admit: 2017-10-01 | Discharge: 2017-10-02 | Disposition: A | Discharge status: Home / Self Care | Payer: Medicaid Other | Attending: Obstetrics & Gynecology | Admitting: Obstetrics & Gynecology

## 2017-10-01 DIAGNOSIS — O99213 Obesity complicating pregnancy, third trimester: Secondary | ICD-10-CM | POA: Insufficient documentation

## 2017-10-01 DIAGNOSIS — O26893 Other specified pregnancy related conditions, third trimester: Secondary | ICD-10-CM | POA: Insufficient documentation

## 2017-10-01 DIAGNOSIS — O479 False labor, unspecified: Secondary | ICD-10-CM

## 2017-10-01 DIAGNOSIS — E669 Obesity, unspecified: Secondary | ICD-10-CM | POA: Insufficient documentation

## 2017-10-01 DIAGNOSIS — Z3A38 38 weeks gestation of pregnancy: Secondary | ICD-10-CM

## 2017-10-02 ENCOUNTER — Encounter (HOSPITAL_COMMUNITY): Payer: Self-pay | Admitting: *Deleted

## 2017-10-02 DIAGNOSIS — O26893 Other specified pregnancy related conditions, third trimester: Secondary | ICD-10-CM | POA: Diagnosis not present

## 2017-10-02 DIAGNOSIS — E669 Obesity, unspecified: Secondary | ICD-10-CM | POA: Diagnosis not present

## 2017-10-02 DIAGNOSIS — Z3A38 38 weeks gestation of pregnancy: Secondary | ICD-10-CM | POA: Diagnosis not present

## 2017-10-02 DIAGNOSIS — O99213 Obesity complicating pregnancy, third trimester: Secondary | ICD-10-CM | POA: Diagnosis not present

## 2017-10-02 NOTE — MAU Note (Signed)
Contractions ag0, denies bleeding or leaking of fluid

## 2017-10-02 NOTE — MAU Note (Signed)
Urine in lab 

## 2017-10-02 NOTE — Discharge Instructions (Signed)
Braxton Hicks Contractions °Contractions of the uterus can occur throughout pregnancy, but they are not always a sign that you are in labor. You may have practice contractions called Braxton Hicks contractions. These false labor contractions are sometimes confused with true labor. °What are Braxton Hicks contractions? °Braxton Hicks contractions are tightening movements that occur in the muscles of the uterus before labor. Unlike true labor contractions, these contractions do not result in opening (dilation) and thinning of the cervix. Toward the end of pregnancy (32-34 weeks), Braxton Hicks contractions can happen more often and may become stronger. These contractions are sometimes difficult to tell apart from true labor because they can be very uncomfortable. You should not feel embarrassed if you go to the hospital with false labor. °Sometimes, the only way to tell if you are in true labor is for your health care provider to look for changes in the cervix. The health care provider will do a physical exam and may monitor your contractions. If you are not in true labor, the exam should show that your cervix is not dilating and your water has not broken. °If there are other health problems associated with your pregnancy, it is completely safe for you to be sent home with false labor. You may continue to have Braxton Hicks contractions until you go into true labor. °How to tell the difference between true labor and false labor °True labor °· Contractions last 30-70 seconds. °· Contractions become very regular. °· Discomfort is usually felt in the top of the uterus, and it spreads to the lower abdomen and low back. °· Contractions do not go away with walking. °· Contractions usually become more intense and increase in frequency. °· The cervix dilates and gets thinner. °False labor °· Contractions are usually shorter and not as strong as true labor contractions. °· Contractions are usually irregular. °· Contractions  are often felt in the front of the lower abdomen and in the groin. °· Contractions may go away when you walk around or change positions while lying down. °· Contractions get weaker and are shorter-lasting as time goes on. °· The cervix usually does not dilate or become thin. °Follow these instructions at home: °· Take over-the-counter and prescription medicines only as told by your health care provider. °· Keep up with your usual exercises and follow other instructions from your health care provider. °· Eat and drink lightly if you think you are going into labor. °· If Braxton Hicks contractions are making you uncomfortable: °? Change your position from lying down or resting to walking, or change from walking to resting. °? Sit and rest in a tub of warm water. °? Drink enough fluid to keep your urine pale yellow. Dehydration may cause these contractions. °? Do slow and deep breathing several times an hour. °· Keep all follow-up prenatal visits as told by your health care provider. This is important. °Contact a health care provider if: °· You have a fever. °· You have continuous pain in your abdomen. °Get help right away if: °· Your contractions become stronger, more regular, and closer together. °· You have fluid leaking or gushing from your vagina. °· You pass blood-tinged mucus (bloody show). °· You have bleeding from your vagina. °· You have low back pain that you never had before. °· You feel your baby’s head pushing down and causing pelvic pressure. °· Your baby is not moving inside you as much as it used to. °Summary °· Contractions that occur before labor are called Braxton   Hicks contractions, false labor, or practice contractions. °· Braxton Hicks contractions are usually shorter, weaker, farther apart, and less regular than true labor contractions. True labor contractions usually become progressively stronger and regular and they become more frequent. °· Manage discomfort from Braxton Hicks contractions by  changing position, resting in a warm bath, drinking plenty of water, or practicing deep breathing. °This information is not intended to replace advice given to you by your health care provider. Make sure you discuss any questions you have with your health care provider. °Document Released: 05/13/2016 Document Revised: 05/13/2016 Document Reviewed: 05/13/2016 °Elsevier Interactive Patient Education © 2018 Elsevier Inc. ° °

## 2017-10-02 NOTE — MAU Provider Note (Signed)
RN called for labor check. Patient is a 21yo G1P0 at 1723w3d who presented to MAU with contractions. Pregnancy has been complicated by obesity, pre-diabetes, vitamin D deficiency. Her SVE is 1/thick/-3. She is not having regular contractions and is not in labor. She denied vaginal bleeding, leakage of fluid and is feeling good movement. Reactive NST 140s  mod  +a  -d  uterine irritability. Labor precautions reviewed and patient discharged home.

## 2017-10-06 ENCOUNTER — Ambulatory Visit (INDEPENDENT_AMBULATORY_CARE_PROVIDER_SITE_OTHER): Payer: No Typology Code available for payment source | Admitting: Obstetrics and Gynecology

## 2017-10-06 VITALS — BP 128/79 | HR 99 | Wt 222.9 lb

## 2017-10-06 DIAGNOSIS — O99210 Obesity complicating pregnancy, unspecified trimester: Secondary | ICD-10-CM

## 2017-10-06 DIAGNOSIS — Z3400 Encounter for supervision of normal first pregnancy, unspecified trimester: Secondary | ICD-10-CM

## 2017-10-06 NOTE — Addendum Note (Signed)
Addended by: Leroy Libman on: 10/06/2017 01:38 PM   Modules accepted: Orders, SmartSet

## 2017-10-06 NOTE — Progress Notes (Signed)
   PRENATAL VISIT NOTE  Subjective:  Jean Solis is a 22 y.o. G1P0000 at [redacted]w[redacted]d being seen today for ongoing prenatal care.  She is currently monitored for the following issues for this low-risk pregnancy and has Eczema; Supervision of normal first pregnancy, antepartum; Obesity affecting pregnancy, antepartum; Vitamin D deficiency; Obesity (BMI 30.0-34.9); Pre-diabetes; and Uterine size date discrepancy pregnancy, third trimester on their problem list.  Patient reports cold symptoms, pelvic pressure.  Contractions: Irregular. Vag. Bleeding: None.  Movement: Present. Denies leaking of fluid.   The following portions of the patient's history were reviewed and updated as appropriate: allergies, current medications, past family history, past medical history, past social history, past surgical history and problem list. Problem list updated.  Objective:   Vitals:   10/06/17 0930  BP: 128/79  Pulse: 99  Weight: 222 lb 14.4 oz (101.1 kg)    Fetal Status: Fetal Heart Rate (bpm): 145   Movement: Present     General:  Alert, oriented and cooperative. Patient is in no acute distress.  Skin: Skin is warm and dry. No rash noted.   Cardiovascular: Normal heart rate noted  Respiratory: Normal respiratory effort, no problems with respiration noted  Abdomen: Soft, gravid, appropriate for gestational age.  Pain/Pressure: Absent     Pelvic: Cervical exam performed      1-2/50/-3  Extremities: Normal range of motion.  Edema: Trace  Mental Status: Normal mood and affect. Normal behavior. Normal judgment and thought content.   Assessment and Plan:  Pregnancy: G1P0000 at [redacted]w[redacted]d  1. Supervision of normal first pregnancy, antepartum Wants IUD  2. Obesity affecting pregnancy, antepartum   Preterm labor symptoms and general obstetric precautions including but not limited to vaginal bleeding, contractions, leaking of fluid and fetal movement were reviewed in detail with the patient. Please refer to  After Visit Summary for other counseling recommendations.  Return in about 1 week (around 10/13/2017) for OB visit.  Future Appointments  Date Time Provider Department Center  10/13/2017  9:45 AM Conan Bowens, MD [facility] None    Conan Bowens, MD

## 2017-10-06 NOTE — Progress Notes (Signed)
Pt presents for ROB.  No concerns! 

## 2017-10-07 ENCOUNTER — Telehealth (HOSPITAL_COMMUNITY): Payer: Self-pay | Admitting: *Deleted

## 2017-10-07 NOTE — Telephone Encounter (Signed)
Preadmission screen  

## 2017-10-09 ENCOUNTER — Encounter (HOSPITAL_COMMUNITY): Payer: Self-pay | Admitting: *Deleted

## 2017-10-09 ENCOUNTER — Inpatient Hospital Stay (HOSPITAL_COMMUNITY)
Admission: AD | Admit: 2017-10-09 | Discharge: 2017-10-09 | Disposition: A | Discharge status: Home / Self Care | Payer: Medicaid Other | Source: Ambulatory Visit | Attending: Obstetrics & Gynecology | Admitting: Obstetrics & Gynecology

## 2017-10-09 DIAGNOSIS — O99210 Obesity complicating pregnancy, unspecified trimester: Secondary | ICD-10-CM

## 2017-10-09 DIAGNOSIS — O471 False labor at or after 37 completed weeks of gestation: Secondary | ICD-10-CM | POA: Diagnosis not present

## 2017-10-09 DIAGNOSIS — Z3A39 39 weeks gestation of pregnancy: Secondary | ICD-10-CM | POA: Diagnosis not present

## 2017-10-09 DIAGNOSIS — O479 False labor, unspecified: Secondary | ICD-10-CM

## 2017-10-09 DIAGNOSIS — Z3400 Encounter for supervision of normal first pregnancy, unspecified trimester: Secondary | ICD-10-CM

## 2017-10-09 NOTE — Discharge Instructions (Signed)
Braxton Hicks Contractions °Contractions of the uterus can occur throughout pregnancy, but they are not always a sign that you are in labor. You may have practice contractions called Braxton Hicks contractions. These false labor contractions are sometimes confused with true labor. °What are Braxton Hicks contractions? °Braxton Hicks contractions are tightening movements that occur in the muscles of the uterus before labor. Unlike true labor contractions, these contractions do not result in opening (dilation) and thinning of the cervix. Toward the end of pregnancy (32-34 weeks), Braxton Hicks contractions can happen more often and may become stronger. These contractions are sometimes difficult to tell apart from true labor because they can be very uncomfortable. You should not feel embarrassed if you go to the hospital with false labor. °Sometimes, the only way to tell if you are in true labor is for your health care provider to look for changes in the cervix. The health care provider will do a physical exam and may monitor your contractions. If you are not in true labor, the exam should show that your cervix is not dilating and your water has not broken. °If there are other health problems associated with your pregnancy, it is completely safe for you to be sent home with false labor. You may continue to have Braxton Hicks contractions until you go into true labor. °How to tell the difference between true labor and false labor °True labor °· Contractions last 30-70 seconds. °· Contractions become very regular. °· Discomfort is usually felt in the top of the uterus, and it spreads to the lower abdomen and low back. °· Contractions do not go away with walking. °· Contractions usually become more intense and increase in frequency. °· The cervix dilates and gets thinner. °False labor °· Contractions are usually shorter and not as strong as true labor contractions. °· Contractions are usually irregular. °· Contractions  are often felt in the front of the lower abdomen and in the groin. °· Contractions may go away when you walk around or change positions while lying down. °· Contractions get weaker and are shorter-lasting as time goes on. °· The cervix usually does not dilate or become thin. °Follow these instructions at home: °· Take over-the-counter and prescription medicines only as told by your health care provider. °· Keep up with your usual exercises and follow other instructions from your health care provider. °· Eat and drink lightly if you think you are going into labor. °· If Braxton Hicks contractions are making you uncomfortable: °? Change your position from lying down or resting to walking, or change from walking to resting. °? Sit and rest in a tub of warm water. °? Drink enough fluid to keep your urine pale yellow. Dehydration may cause these contractions. °? Do slow and deep breathing several times an hour. °· Keep all follow-up prenatal visits as told by your health care provider. This is important. °Contact a health care provider if: °· You have a fever. °· You have continuous pain in your abdomen. °Get help right away if: °· Your contractions become stronger, more regular, and closer together. °· You have fluid leaking or gushing from your vagina. °· You pass blood-tinged mucus (bloody show). °· You have bleeding from your vagina. °· You have low back pain that you never had before. °· You feel your baby’s head pushing down and causing pelvic pressure. °· Your baby is not moving inside you as much as it used to. °Summary °· Contractions that occur before labor are called Braxton   Hicks contractions, false labor, or practice contractions. °· Braxton Hicks contractions are usually shorter, weaker, farther apart, and less regular than true labor contractions. True labor contractions usually become progressively stronger and regular and they become more frequent. °· Manage discomfort from Braxton Hicks contractions by  changing position, resting in a warm bath, drinking plenty of water, or practicing deep breathing. °This information is not intended to replace advice given to you by your health care provider. Make sure you discuss any questions you have with your health care provider. °Document Released: 05/13/2016 Document Revised: 05/13/2016 Document Reviewed: 05/13/2016 °Elsevier Interactive Patient Education © 2018 Elsevier Inc. ° °Fetal Movement Counts °Patient Name: ________________________________________________ Patient Due Date: ____________________ °What is a fetal movement count? °A fetal movement count is the number of times that you feel your baby move during a certain amount of time. This may also be called a fetal kick count. A fetal movement count is recommended for every pregnant woman. You may be asked to start counting fetal movements as early as week 28 of your pregnancy. °Pay attention to when your baby is most active. You may notice your baby's sleep and wake cycles. You may also notice things that make your baby move more. You should do a fetal movement count: °· When your baby is normally most active. °· At the same time each day. ° °A good time to count movements is while you are resting, after having something to eat and drink. °How do I count fetal movements? °1. Find a quiet, comfortable area. Sit, or lie down on your side. °2. Write down the date, the start time and stop time, and the number of movements that you felt between those two times. Take this information with you to your health care visits. °3. For 2 hours, count kicks, flutters, swishes, rolls, and jabs. You should feel at least 10 movements during 2 hours. °4. You may stop counting after you have felt 10 movements. °5. If you do not feel 10 movements in 2 hours, have something to eat and drink. Then, keep resting and counting for 1 hour. If you feel at least 4 movements during that hour, you may stop counting. °Contact a health care  provider if: °· You feel fewer than 4 movements in 2 hours. °· Your baby is not moving like he or she usually does. °Date: ____________ Start time: ____________ Stop time: ____________ Movements: ____________ °Date: ____________ Start time: ____________ Stop time: ____________ Movements: ____________ °Date: ____________ Start time: ____________ Stop time: ____________ Movements: ____________ °Date: ____________ Start time: ____________ Stop time: ____________ Movements: ____________ °Date: ____________ Start time: ____________ Stop time: ____________ Movements: ____________ °Date: ____________ Start time: ____________ Stop time: ____________ Movements: ____________ °Date: ____________ Start time: ____________ Stop time: ____________ Movements: ____________ °Date: ____________ Start time: ____________ Stop time: ____________ Movements: ____________ °Date: ____________ Start time: ____________ Stop time: ____________ Movements: ____________ °This information is not intended to replace advice given to you by your health care provider. Make sure you discuss any questions you have with your health care provider. °Document Released: 01/27/2006 Document Revised: 08/27/2015 Document Reviewed: 02/06/2015 °Elsevier Interactive Patient Education © 2018 Elsevier Inc. ° °

## 2017-10-09 NOTE — MAU Provider Note (Signed)
RN Labor Designer, multimedia called for labor check. Patient is a 21yo G1P0 at [redacted]w[redacted]d who presents to MAU with contractions. Pregnancy complicated by obesity, pre-diabetes, vitamin D deficiency. Her SVE today is 2/thick and posterior. She reports possibly losing her mucous plug earlier today. Denies VB, LOF, reports normal movement. Reactive NST 145-150  mod +a  -d  uterine irritability. RN reports patient comfortable in the room, contractions are irregular. Labor precautions reviewed and patient discharged home.

## 2017-10-09 NOTE — MAU Note (Signed)
Pt states she started having contractions around 0800 this morning, went to work, she called the on call provider around 1715 and was instructed to be evaluated for labor.  Denies LOF, some mild pink tinged mucus.

## 2017-10-09 NOTE — Progress Notes (Signed)
I have communicated with Dr. Rhett Bannister and reviewed vital signs:  Vitals:   10/09/17 2100 10/09/17 2128  BP: 123/84 123/72  Pulse: (!) 103 90  Resp:    Temp:    SpO2:      Vaginal exam:  Dilation: 2 Effacement (%): 50 Cervical Position: Posterior Station: -3 Presentation: Vertex Exam by:: A. Sheniqua Carolan, RN,   Also reviewed contraction pattern and that non-stress test is reactive.  It has been documented that patient is contracting every 2-5 minutes with a cervical exam of 2/50/-3 not indicating active labor.  Patient denies any other complaints.  Based on this report provider has given order for discharge.  A discharge order and diagnosis entered by a provider.   Labor discharge instructions reviewed with patient.

## 2017-10-10 ENCOUNTER — Inpatient Hospital Stay (HOSPITAL_COMMUNITY): Payer: Medicaid Other | Admitting: Anesthesiology

## 2017-10-10 ENCOUNTER — Telehealth: Payer: Self-pay | Admitting: Obstetrics

## 2017-10-10 ENCOUNTER — Encounter (HOSPITAL_COMMUNITY): Payer: Self-pay

## 2017-10-10 ENCOUNTER — Other Ambulatory Visit: Payer: Self-pay

## 2017-10-10 ENCOUNTER — Other Ambulatory Visit: Payer: Self-pay | Admitting: Family Medicine

## 2017-10-10 ENCOUNTER — Inpatient Hospital Stay (HOSPITAL_COMMUNITY)
Admission: AD | Admit: 2017-10-10 | Discharge: 2017-10-13 | DRG: 807 | Disposition: A | Discharge status: Home / Self Care | Payer: Medicaid Other | Attending: Obstetrics and Gynecology | Admitting: Obstetrics and Gynecology

## 2017-10-10 DIAGNOSIS — Z3A39 39 weeks gestation of pregnancy: Secondary | ICD-10-CM

## 2017-10-10 DIAGNOSIS — O4202 Full-term premature rupture of membranes, onset of labor within 24 hours of rupture: Secondary | ICD-10-CM | POA: Diagnosis not present

## 2017-10-10 DIAGNOSIS — O4292 Full-term premature rupture of membranes, unspecified as to length of time between rupture and onset of labor: Principal | ICD-10-CM | POA: Diagnosis present

## 2017-10-10 DIAGNOSIS — Z3400 Encounter for supervision of normal first pregnancy, unspecified trimester: Secondary | ICD-10-CM

## 2017-10-10 DIAGNOSIS — R7303 Prediabetes: Secondary | ICD-10-CM | POA: Diagnosis present

## 2017-10-10 DIAGNOSIS — O99210 Obesity complicating pregnancy, unspecified trimester: Secondary | ICD-10-CM | POA: Diagnosis present

## 2017-10-10 DIAGNOSIS — O99214 Obesity complicating childbirth: Secondary | ICD-10-CM | POA: Diagnosis present

## 2017-10-10 DIAGNOSIS — E669 Obesity, unspecified: Secondary | ICD-10-CM | POA: Diagnosis present

## 2017-10-10 DIAGNOSIS — O480 Post-term pregnancy: Secondary | ICD-10-CM | POA: Diagnosis present

## 2017-10-10 LAB — CBC
HCT: 37.8 % (ref 36.0–46.0)
Hemoglobin: 12.5 g/dL (ref 12.0–15.0)
MCH: 26.6 pg (ref 26.0–34.0)
MCHC: 33.1 g/dL (ref 30.0–36.0)
MCV: 80.4 fL (ref 78.0–100.0)
Platelets: 205 10*3/uL (ref 150–400)
RBC: 4.7 MIL/uL (ref 3.87–5.11)
RDW: 15.6 % — AB (ref 11.5–15.5)
WBC: 8.7 10*3/uL (ref 4.0–10.5)

## 2017-10-10 LAB — TYPE AND SCREEN
ABO/RH(D): B POS
Antibody Screen: NEGATIVE

## 2017-10-10 MED ORDER — OXYTOCIN 40 UNITS IN LACTATED RINGERS INFUSION - SIMPLE MED
2.5000 [IU]/h | INTRAVENOUS | Status: DC
  Filled 2017-10-10: qty 1000

## 2017-10-10 MED ORDER — PHENYLEPHRINE 40 MCG/ML (10ML) SYRINGE FOR IV PUSH (FOR BLOOD PRESSURE SUPPORT)
80.0000 ug | PREFILLED_SYRINGE | INTRAVENOUS | Status: DC | PRN
  Administered 2017-10-10: 80 ug via INTRAVENOUS
  Filled 2017-10-10: qty 10
  Filled 2017-10-10: qty 5

## 2017-10-10 MED ORDER — DIPHENHYDRAMINE HCL 50 MG/ML IJ SOLN: 12.5000 mg | INTRAMUSCULAR | Status: DC | PRN

## 2017-10-10 MED ORDER — LACTATED RINGERS IV SOLN
500.0000 mL | Freq: Once | INTRAVENOUS | Status: AC
  Administered 2017-10-10: 500 mL via INTRAVENOUS

## 2017-10-10 MED ORDER — OXYTOCIN 40 UNITS IN LACTATED RINGERS INFUSION - SIMPLE MED
1.0000 m[IU]/min | INTRAVENOUS | Status: DC
  Administered 2017-10-10: 2 m[IU]/min via INTRAVENOUS

## 2017-10-10 MED ORDER — LIDOCAINE HCL (PF) 1 % IJ SOLN
INTRAMUSCULAR | Status: DC | PRN
  Administered 2017-10-10: 6 mL via EPIDURAL
  Administered 2017-10-10: 7 mL via EPIDURAL

## 2017-10-10 MED ORDER — PHENYLEPHRINE 40 MCG/ML (10ML) SYRINGE FOR IV PUSH (FOR BLOOD PRESSURE SUPPORT)
80.0000 ug | PREFILLED_SYRINGE | INTRAVENOUS | Status: DC | PRN
  Administered 2017-10-10: 80 ug via INTRAVENOUS
  Filled 2017-10-10: qty 5

## 2017-10-10 MED ORDER — LACTATED RINGERS IV SOLN
INTRAVENOUS | Status: DC
  Administered 2017-10-10 – 2017-10-11 (×2): via INTRAVENOUS

## 2017-10-10 MED ORDER — FENTANYL 2.5 MCG/ML BUPIVACAINE 1/10 % EPIDURAL INFUSION (WH - ANES)
14.0000 mL/h | INTRAMUSCULAR | Status: DC | PRN
  Administered 2017-10-10: 14 mL/h via EPIDURAL
  Filled 2017-10-10: qty 100

## 2017-10-10 MED ORDER — OXYTOCIN BOLUS FROM INFUSION
500.0000 mL | Freq: Once | INTRAVENOUS | Status: AC
  Administered 2017-10-11: 500 mL via INTRAVENOUS

## 2017-10-10 MED ORDER — OXYCODONE-ACETAMINOPHEN 5-325 MG PO TABS: 1.0000 | ORAL_TABLET | ORAL | Status: DC | PRN

## 2017-10-10 MED ORDER — SOD CITRATE-CITRIC ACID 500-334 MG/5ML PO SOLN: 30.0000 mL | ORAL | Status: DC | PRN

## 2017-10-10 MED ORDER — FENTANYL CITRATE (PF) 100 MCG/2ML IJ SOLN
100.0000 ug | Freq: Once | INTRAMUSCULAR | Status: AC
  Administered 2017-10-10: 100 ug via INTRAVENOUS
  Filled 2017-10-10: qty 2

## 2017-10-10 MED ORDER — OXYCODONE-ACETAMINOPHEN 5-325 MG PO TABS
1.0000 | ORAL_TABLET | Freq: Once | ORAL | Status: AC
  Administered 2017-10-10: 1 via ORAL
  Filled 2017-10-10: qty 1

## 2017-10-10 MED ORDER — LACTATED RINGERS IV SOLN
500.0000 mL | INTRAVENOUS | Status: DC | PRN
  Administered 2017-10-10: 500 mL via INTRAVENOUS

## 2017-10-10 MED ORDER — LIDOCAINE HCL (PF) 1 % IJ SOLN
30.0000 mL | INTRAMUSCULAR | Status: DC | PRN
  Filled 2017-10-10: qty 30

## 2017-10-10 MED ORDER — TERBUTALINE SULFATE 1 MG/ML IJ SOLN
0.2500 mg | Freq: Once | INTRAMUSCULAR | Status: DC | PRN
  Filled 2017-10-10: qty 1

## 2017-10-10 MED ORDER — OXYCODONE-ACETAMINOPHEN 5-325 MG PO TABS: 2.0000 | ORAL_TABLET | ORAL | Status: DC | PRN

## 2017-10-10 MED ORDER — MISOPROSTOL 25 MCG QUARTER TABLET
25.0000 ug | ORAL_TABLET | ORAL | Status: DC | PRN
  Filled 2017-10-10: qty 1

## 2017-10-10 MED ORDER — ONDANSETRON HCL 4 MG/2ML IJ SOLN: 4.0000 mg | Freq: Four times a day (QID) | INTRAMUSCULAR | Status: DC | PRN

## 2017-10-10 MED ORDER — ACETAMINOPHEN 325 MG PO TABS: 650.0000 mg | ORAL_TABLET | ORAL | Status: DC | PRN

## 2017-10-10 MED ORDER — EPHEDRINE 5 MG/ML INJ
10.0000 mg | INTRAVENOUS | Status: DC | PRN
  Administered 2017-10-10: 10 mg via INTRAVENOUS
  Filled 2017-10-10: qty 2

## 2017-10-10 MED ORDER — EPHEDRINE 5 MG/ML INJ
10.0000 mg | INTRAVENOUS | Status: DC | PRN
  Filled 2017-10-10: qty 2

## 2017-10-10 NOTE — MAU Provider Note (Signed)
First Provider Initiated Contact with Patient 10/10/17 1445       S: [name] is a 22 y.o. G1P0000 at [redacted]w[redacted]d  who presents to MAU today complaining contractions q 5 minutes   She endorses vaginal bleeding. She denies LOF. She reports normal fetal movement.    O: BP 126/88   Pulse (!) 113   Temp 97.9 F (36.6 C)   Resp 18   LMP 01/04/2017  GENERAL: Well-developed, well-nourished female in no acute distress.  HEAD: Normocephalic, atraumatic.  CHEST: Normal effort of breathing, regular heart rate ABDOMEN: Soft, nontender, gravid  Cervical exam:  Dilation: 3 Effacement (%): 80 Cervical Position: Middle Station: -2 Presentation: Vertex Exam by:: B. Bowen, RN    Fetal Monitoring: Baseline: 140 Variability: Average Accelerations: present Decelerations: absent Contractions: q 3-5 min  Cervix changed one cm but with different examiners .Will give a Percocet and watch one more hour Later she ruptured membranes at 1522  A: [facility] at [redacted]w[redacted]d  Active labor PROM at term  P: admit to [facility] Routine orders  Aviva Signs, CNM 10/10/2017 2:45 PM

## 2017-10-10 NOTE — Telephone Encounter (Signed)
Spoke with pt mother.  Advised if pt is still ctx and more frequent/ increase in pain she should be seen at [facility]. Mother made aware pt may be in early labor, nothing that can be done in office for her at this time. Pt mother ask for pain medication for pt.  Advised pt may be seen in office to discuss/ eval need for medication. Advised that she be seen in MAU for Labor eval. Pt mother made aware it is discouraging when kept being sent home.  Pt mother made aware that even though pt is ctx, if no cervical change, may possibly not be admitted. Pt mother again offered in office visit or to be seen at [facility]. Pt mother states she will take pt to [facility]. Advised to contact office with any further concerns.

## 2017-10-10 NOTE — Anesthesia Pain Management Evaluation Note (Signed)
  CRNA Pain Management Visit Note  Patient: Jean Solis, 22 y.o., female  "Hello I am a member of the anesthesia team at [facility]. We have an anesthesia team available at all times to provide care throughout the hospital, including epidural management and anesthesia for C-section. I don't know your plan for the delivery whether it a natural birth, water birth, IV sedation, nitrous supplementation, doula or epidural, but we want to meet your pain goals."   1.Was your pain managed to your expectations on prior hospitalizations?   No prior hospitalizations  2.What is your expectation for pain management during this hospitalization?     Water tub  3.How can we help you reach that goal? Be available, planning water bath  Record the patient's initial score and the patient's pain goal.   Pain: 10  Pain Goal: 10 [facility] wants you to be able to say your pain was always managed very well.  [facility] 10/10/2017

## 2017-10-10 NOTE — Anesthesia Preprocedure Evaluation (Signed)
Anesthesia Evaluation  Patient identified by MRN, date of birth, ID band Patient awake    Reviewed: Allergy & Precautions, H&P , NPO status , Patient's Chart, lab work & pertinent test results  Airway Mallampati: II  TM Distance: >3 FB Neck ROM: full    Dental no notable dental hx. (+) Teeth Intact   Pulmonary neg pulmonary ROS,    Pulmonary exam normal breath sounds clear to auscultation       Cardiovascular negative cardio ROS Normal cardiovascular exam Rhythm:regular Rate:Normal     Neuro/Psych negative neurological ROS  negative psych ROS   GI/Hepatic negative GI ROS, Neg liver ROS,   Endo/Other  negative endocrine ROS  Renal/GU negative Renal ROS  negative genitourinary   Musculoskeletal negative musculoskeletal ROS (+)   Abdominal (+) + obese,   Peds  Hematology negative hematology ROS (+)   Anesthesia Other Findings   Reproductive/Obstetrics (+) Pregnancy                             Anesthesia Physical Anesthesia Plan  ASA: II  Anesthesia Plan: Epidural   Post-op Pain Management:    Induction:   PONV Risk Score and Plan:   Airway Management Planned:   Additional Equipment:   Intra-op Plan:   Post-operative Plan:   Informed Consent: I have reviewed the patients History and Physical, chart, labs and discussed the procedure including the risks, benefits and alternatives for the proposed anesthesia with the patient or authorized representative who has indicated his/her understanding and acceptance.       Plan Discussed with:   Anesthesia Plan Comments:         Anesthesia Quick Evaluation  

## 2017-10-10 NOTE — Progress Notes (Signed)
OB/GYN Faculty Practice: Labor Progress Note  Subjective: Feeling very uncomfortable. No longer in tub because wants pain medication. Crying in between contractions. Lots of family in room.   Objective: BP 131/79   Pulse (!) 117   Temp 98.1 F (36.7 C) (Oral)   Resp 16   Ht 5\' 5"  (1.651 m)   Wt 101.8 kg   LMP 01/04/2017   BMI 37.35 kg/m  Gen: tearful, lying on side of bed Dilation: 3.5 Effacement (%): 80 Cervical Position: Middle Station: -2, -1 Presentation: Vertex Exam by:: Mary [place] Johnson, RN   Assessment and Plan: 22 y.o. G1P0000 [redacted]w[redacted]d here with SROM, early labor.  Labor: Latent labor. Had completed waterbirth class and consent. No longer in tub at this time because wanted to try IV pain medication. Discussed options with patient including IV fentanyl, epidural, continuing to be in tub.  -- recheck in about 20 minutes to decide if pitocin/epidural or other option -- pain control: will try IV fentanyl once   Fetal Well-Being: EFW 8lbs by [place]. Cephalic by prior checks.  -- Category I - continuous fetal monitoring  -- GBS negative    Starling Jessie S. Earlene Plater, DO OB/GYN Fellow, Faculty Practice  9:40 PM

## 2017-10-10 NOTE — H&P (Signed)
Jean Solis is a 22 y.o. female presenting for uterine contractions. She ruptured her membranes while here for the labor eval at 1522. Clear fluid  . OB History    Gravida  1   Para  0   Term  0   Preterm  0   AB  0   Living  0     SAB  0   TAB  0   Ectopic  0   Multiple  0   Live Births  0          Past Medical History:  Diagnosis Date  . Chlamydia 09/2011   Past Surgical History:  Procedure Laterality Date  . BARTHOLIN CYST MARSUPIALIZATION Right 2012  . TONSILLECTOMY     Family History: family history includes Diabetes in her maternal uncle; Hyperlipidemia in her father and mother; Hypertension in her father, maternal grandmother, and mother. Social History:  reports that she has never smoked. She has never used smokeless tobacco. She reports that she does not drink alcohol or use drugs.     Maternal Diabetes: No Genetic Screening: Normal Maternal Ultrasounds/Referrals: Normal Fetal Ultrasounds or other Referrals:  None Maternal Substance Abuse:  No Significant Maternal Medications:  None Significant Maternal Lab Results:  Lab values include: Group B Strep negative Other Comments:  None  Review of Systems  Constitutional: Negative for chills and fever.  Eyes: Negative for blurred vision.  Respiratory: Negative for shortness of breath.   Cardiovascular: Negative for leg swelling.  Gastrointestinal: Positive for abdominal pain. Negative for constipation, diarrhea, nausea and vomiting.  Neurological: Negative for focal weakness.   Maternal Medical History:  Reason for admission: Rupture of membranes and contractions.  Nausea.  Contractions: Onset was 1-2 hours ago.   Frequency: regular.   Perceived severity is strong.    Fetal activity: Perceived fetal activity is normal.   Last perceived fetal movement was within the past hour.    Prenatal complications: No bleeding, PIH, pre-eclampsia or preterm labor.   Prenatal Complications -  Diabetes: none.    Dilation: 3 Effacement (%): 80 Station: -2 Exam by:: B. Bowen, RN  Blood pressure 126/88, pulse (!) 113, temperature 97.9 F (36.6 C), resp. rate 18, last menstrual period 01/04/2017. Maternal Exam:  Uterine Assessment: Contraction strength is firm.  Contraction frequency is regular.   Abdomen: Patient reports no abdominal tenderness. Estimated fetal weight is 7.   Fetal presentation: vertex  Introitus: Normal vulva. Vagina is positive for vaginal discharge.  Ferning test: not done.  Nitrazine test: not done. Amniotic fluid character: clear. Gross rupture of membranes   Pelvis: adequate for delivery.   Cervix: Cervix evaluated by digital exam.     Fetal Exam Fetal Monitor Review: Mode: ultrasound.   Baseline rate: 140.  Variability: moderate (6-25 bpm).   Pattern: accelerations present and no decelerations.    Fetal State Assessment: Category I - tracings are normal.     Physical Exam  Constitutional: She is oriented to person, place, and time. She appears well-developed and well-nourished. No distress.  HENT:  Head: Normocephalic.  Cardiovascular: Normal rate and regular rhythm.  Respiratory: Effort normal and breath sounds normal.  GI: Soft. She exhibits no distension. There is no tenderness. There is no rebound and no guarding.  Genitourinary: Vaginal discharge found.  Genitourinary Comments: Dilation: 3 Effacement (%): 80 Cervical Position: Middle Station: -2 Presentation: Vertex Exam by:: B. Bowen, RN    Musculoskeletal: Normal range of motion.  Neurological: She is alert and  oriented to person, place, and time.  Skin: Skin is warm and dry.  Psychiatric: She has a normal mood and affect.    Prenatal labs: ABO, Rh: B/Positive/-- (03/13 1111) Antibody: Negative (03/13 1111) Rubella: 3.49 (03/13 1111) RPR: Non Reactive (07/17 1115)  HBsAg: Negative (03/13 1111)  HIV: Non Reactive (07/17 1115)  GBS: Negative (09/09 1726)    Assessment/Plan: Single intrauterine pregnancy at [redacted]w[redacted]d Latent phase labor Premature Rupture of Membranes at term  Admit to [facility] Routine orders Plans waterbirth, will sign consent today    Wynelle Bourgeois 10/10/2017, 3:35 PM

## 2017-10-10 NOTE — Anesthesia Procedure Notes (Signed)
Epidural Patient location during procedure: OB Start time: 10/10/2017 11:34 PM End time: 10/10/2017 11:36 PM  Staffing Anesthesiologist: Leilani Able, MD Performed: anesthesiologist   Preanesthetic Checklist Completed: patient identified, site marked, surgical consent, pre-op evaluation, timeout performed, IV checked, risks and benefits discussed and monitors and equipment checked  Epidural Patient position: sitting Prep: site prepped and draped and DuraPrep Patient monitoring: continuous pulse ox and blood pressure Approach: midline Location: L3-L4 Injection technique: LOR air  Needle:  Needle type: Tuohy  Needle gauge: 17 G Needle length: 9 cm and 9 Needle insertion depth: 8 cm Catheter type: closed end flexible Catheter size: 19 Gauge Catheter at skin depth: 14 cm Test dose: negative and Other  Assessment Events: blood not aspirated, injection not painful, no injection resistance, negative IV test and no paresthesia  Additional Notes Reason for block:procedure for pain

## 2017-10-10 NOTE — MAU Note (Addendum)
Pt presents to MAU with complaints of contractions, was evaluated in MAU yesterday and was 2cm. Denies any LOF or VB

## 2017-10-10 NOTE — Telephone Encounter (Signed)
Per call from mother pt D/c [facility] 9:30 pm last night dilated 2 cm per pt mother. Mother states now contractions between 4-6 min apart. Pt scheduled induction 10/20/17. F/U appt here 10/13/17. Call mother please advise.

## 2017-10-11 ENCOUNTER — Encounter (HOSPITAL_COMMUNITY): Payer: Self-pay | Admitting: *Deleted

## 2017-10-11 DIAGNOSIS — Z3A39 39 weeks gestation of pregnancy: Secondary | ICD-10-CM

## 2017-10-11 DIAGNOSIS — O4202 Full-term premature rupture of membranes, onset of labor within 24 hours of rupture: Secondary | ICD-10-CM

## 2017-10-11 LAB — RPR: RPR: NONREACTIVE

## 2017-10-11 MED ORDER — ONDANSETRON HCL 4 MG PO TABS: 4.0000 mg | ORAL_TABLET | ORAL | Status: DC | PRN

## 2017-10-11 MED ORDER — WITCH HAZEL-GLYCERIN EX PADS: 1.0000 "application " | MEDICATED_PAD | CUTANEOUS | Status: DC | PRN

## 2017-10-11 MED ORDER — ZOLPIDEM TARTRATE 5 MG PO TABS: 5.0000 mg | ORAL_TABLET | Freq: Every evening | ORAL | Status: DC | PRN

## 2017-10-11 MED ORDER — OXYCODONE-ACETAMINOPHEN 5-325 MG PO TABS
1.0000 | ORAL_TABLET | ORAL | Status: DC | PRN
  Administered 2017-10-11: 1 via ORAL
  Filled 2017-10-11: qty 1

## 2017-10-11 MED ORDER — DIPHENHYDRAMINE HCL 25 MG PO CAPS: 25.0000 mg | ORAL_CAPSULE | Freq: Four times a day (QID) | ORAL | Status: DC | PRN

## 2017-10-11 MED ORDER — DIBUCAINE 1 % RE OINT: 1.0000 "application " | TOPICAL_OINTMENT | RECTAL | Status: DC | PRN

## 2017-10-11 MED ORDER — ACETAMINOPHEN 325 MG PO TABS
650.0000 mg | ORAL_TABLET | ORAL | Status: DC | PRN
  Administered 2017-10-11: 650 mg via ORAL
  Filled 2017-10-11: qty 2

## 2017-10-11 MED ORDER — COCONUT OIL OIL
1.0000 "application " | TOPICAL_OIL | Status: DC | PRN
  Filled 2017-10-11: qty 120

## 2017-10-11 MED ORDER — IBUPROFEN 600 MG PO TABS
600.0000 mg | ORAL_TABLET | Freq: Four times a day (QID) | ORAL | Status: DC
  Administered 2017-10-11 – 2017-10-13 (×9): 600 mg via ORAL
  Filled 2017-10-11 (×10): qty 1

## 2017-10-11 MED ORDER — BENZOCAINE-MENTHOL 20-0.5 % EX AERO
1.0000 "application " | INHALATION_SPRAY | CUTANEOUS | Status: DC | PRN
  Administered 2017-10-11: 1 via TOPICAL
  Filled 2017-10-11: qty 56

## 2017-10-11 MED ORDER — ONDANSETRON HCL 4 MG/2ML IJ SOLN: 4.0000 mg | INTRAMUSCULAR | Status: DC | PRN

## 2017-10-11 MED ORDER — PRENATAL MULTIVITAMIN CH
1.0000 | ORAL_TABLET | Freq: Every day | ORAL | Status: DC
  Administered 2017-10-12 – 2017-10-13 (×2): 1 via ORAL
  Filled 2017-10-11 (×3): qty 1

## 2017-10-11 MED ORDER — SENNOSIDES-DOCUSATE SODIUM 8.6-50 MG PO TABS
2.0000 | ORAL_TABLET | ORAL | Status: DC
  Filled 2017-10-11 (×2): qty 2

## 2017-10-11 MED ORDER — SIMETHICONE 80 MG PO CHEW: 80.0000 mg | CHEWABLE_TABLET | ORAL | Status: DC | PRN

## 2017-10-11 NOTE — Progress Notes (Signed)
OB/GYN Faculty Practice: Labor Progress Note  Subjective: Strip note. Discussed plan of care with RN. Epidural now in place, much more comfortable. Had long conversation with mother and family members regarding plan of care when choosing between epidural/IV pain medications versus water birth.   Objective: BP 108/66   Pulse (!) 109   Temp 98 F (36.7 C) (Oral)   Resp 16   Ht 5\' 5"  (1.651 m)   Wt 101.8 kg   LMP 01/04/2017   SpO2 96%   BMI 37.35 kg/m  Gen: strip note Dilation: 5.5 Effacement (%): 90 Cervical Position: Middle Station: -2 Presentation: Vertex Exam by:: Mary [place] Johnson, RN   Assessment and Plan: 22 y.o. G1P0000 [redacted]w[redacted]d here with SROM, early labor.  Labor: Transitioning. SROM 1520. Started pitocin because of slow change about 6 hours after ROM.  --  Pitocin (2210) continue to titrate  -- pain control: will try IV fentanyl once   Fetal Well-Being: EFW 8lbs by Leopolds. Cephalic by prior checks.  -- Category I - continuous fetal monitoring - period of cat II after epidural placement but related to maternal hypotension  -- GBS negative    Oshen Wlodarczyk S. Earlene Plater, DO OB/GYN Fellow, Faculty Practice  12:53 AM

## 2017-10-11 NOTE — Lactation Note (Signed)
This note was copied from a baby's chart. Lactation Consultation Note  [name] [id number] Date: 10/11/2017 Reason for consult: Initial assessment;1st time breastfeeding;Primapara;Term  P1 mother whose infant is now 73 hours old.  Mother stated that baby has latched on 3 times since birth.  She feels latching is going well and denies pain with latch.  Encouraged her to feed 8-12 times/24 hours or sooner if baby shows cues.  Reviewed feeding cues with mother.  She is familiar with hand expression and I suggested doing this before/after feedings to help increase milk supply.  Colostrum container provided.  Milk storage times reviewed.  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  Once she obtains her insurance card I will provide a DEBP.  Mom made aware of O/P services, breastfeeding support groups, community resources, and our phone # for post-discharge questions.   Suggested mother have an RN/LC observe a latch with the next feeding.  Mother verbalized understanding.  She will call when baby is ready to feed again.  RN in room and aware of situation.   Maternal Data Formula Feeding for Exclusion: No Has patient been taught Hand Expression?: Yes Does the patient have breastfeeding experience prior to this delivery?: No  Feeding Feeding Type: Breast Fed Length of feed: 10 min  LATCH Score Latch: Grasps breast easily, tongue down, lips flanged, rhythmical sucking.  Audible Swallowing: A few with stimulation  Type of Nipple: Everted at rest and after stimulation  Comfort (Breast/Nipple): Soft / non-tender  Hold (Positioning): Assistance needed to correctly position infant at breast and maintain latch.  LATCH Score: 8  Interventions    Lactation Tools Discussed/Used WIC Program: Yes   Consult Status Consult Status: Follow-up Date: 10/12/17 Follow-up type: In-patient    Joyceline Maiorino R Zareena Willis 10/11/2017, 1:18 PM

## 2017-10-11 NOTE — Lactation Note (Signed)
This note was copied from a baby's chart. Lactation Consultation Note  [name] [id number] Date: 10/11/2017 Reason for consult: Initial assessment;1st time breastfeeding;Primapara;Term  LC Follow Up Visit:  Mother had insurance card.  Advanced [employer] style pump given.            Consult Status Consult Status: Follow-up Date: 10/12/17 Follow-up type: In-patient    Cathe Bilger R Kaydyn Sayas 10/11/2017, 1:35 PM

## 2017-10-11 NOTE — Anesthesia Postprocedure Evaluation (Signed)
Anesthesia Post Note  Patient: Jean Solis  Procedure(s) Performed: AN AD HOC LABOR EPIDURAL     Patient location during evaluation: Mother Baby Anesthesia Type: Epidural Level of consciousness: awake and alert Pain management: pain level controlled Vital Signs Assessment: post-procedure vital signs reviewed and stable Respiratory status: spontaneous breathing, nonlabored ventilation and respiratory function stable Cardiovascular status: stable Postop Assessment: no headache, no backache and epidural receding Anesthetic complications: no    Last Vitals:  Vitals:   10/11/17 1020 10/11/17 1420  BP: 123/73 125/75  Pulse: 86 90  Resp: 16 16  Temp: 36.9 C 36.7 C  SpO2: 99% 99%    Last Pain:  Vitals:   10/11/17 1420  TempSrc: Oral  PainSc: 6    Pain Goal: Patients Stated Pain Goal: 2 (10/11/17 1420)               Ronnett Pullin N

## 2017-10-11 NOTE — Progress Notes (Signed)
OB/GYN Faculty Practice: Labor Progress Note  Subjective: Comfortable with epidural. Only complaint now is neck pain, lots of family members in room.   Objective: BP (!) 109/54   Pulse 75   Temp 98 F (36.7 C) (Oral)   Resp 18   Ht 5\' 5"  (1.651 m)   Wt 101.8 kg   LMP 01/04/2017   SpO2 96%   BMI 37.35 kg/m  Gen: comfortable appearing  Dilation: 7.5 Effacement (%): 90 Cervical Position: Middle Station: -1 Presentation: Vertex Exam by:: Irving Burton Rothermel RN   Assessment and Plan: 22 y.o. G1P0000 [redacted]w[redacted]d here with SROM, early labor.  Labor: Active labor. SROM 1520.  -- Pitocin (2210) continue to titrate  -- pain control: epidural in place   Fetal Well-Being: EFW 8lbs by Leopolds. Cephalic by prior checks.  -- Category I - continuous fetal monitoring - period of cat II tracing - bolus and repositioning now improved  -- GBS negative    Laurel S. Earlene Plater, DO OB/GYN Fellow, Faculty Practice  5:02 AM

## 2017-10-12 NOTE — Progress Notes (Addendum)
POSTPARTUM PROGRESS NOTE  Post Partum Day 1 Subjective:  Jean Solis is a 22 y.o. G1P1001 [redacted]w[redacted]d s/p SVD with PROM.  No acute events overnight.  Pt denies problems with ambulating, voiding or po intake.  She denies nausea or vomiting.  Reports back pain and cramping, moderately controlled.  She has had flatus. She has not had bowel movement.  Lochia Small, about same as a period, decreasing.  Denies chills, HA, lightheadedness, visual changes, RUQ pain, SOB.   Objective: Blood pressure 127/69, pulse 83, temperature 98.4 F (36.9 C), temperature source Oral, resp. rate 18, height 5\' 5"  (1.651 m), weight 101.8 kg, last menstrual period 01/04/2017, SpO2 99 % Currently breastfeeding  Physical Exam:  General: alert, cooperative and no distress Lochia:normal flow Chest: CTAB Heart: RRR no m/r/g Abdomen: +BS, soft, nontender,  Uterine Fundus: firm DVT Evaluation: No calf swelling or tenderness Extremities: trace edema  Recent Labs    10/10/17 1546  HGB 12.5  HCT 37.8    Assessment/Plan:  ASSESSMENT: ZAKIYAH Solis is a 22 y.o. G1P1001 [redacted]w[redacted]d s/p SVD.  Will continue to work with lactation, plans to BF Girl Nexplanon for [facility]   LOS: 2 days   Myrtie Hawk, Medical Student 10/12/2017, 7:50 AM

## 2017-10-12 NOTE — Lactation Note (Signed)
This note was copied from a baby's chart. Lactation Consultation Note  [name] [id number] Date: 10/12/2017 Reason for consult: Follow-up assessment;Primapara;Term Mom feels baby is latching well but she chose to give formula once last night because she was worried baby wasn't getting enough.  Baby was unwrapped and placed skin to skin for feeding assessment.  Hand expression done and mom has abundant amount of colostrum easily expressed.  Positioned baby in cradle hold and baby latched easily to breast.  Active feeding with some swallows observed but baby would cry at times during the feeding.  Assisted with football hold on the opposite side.  Baby latched easily and fed well.  Instructed on breast massage/compression during feeding.  Reviewed DEBP with mom.  Encouraged to call out for concerns/assist  Maternal Data    Feeding Feeding Type: Breast Fed Length of feed: 20 min  LATCH Score Latch: Grasps breast easily, tongue down, lips flanged, rhythmical sucking.  Audible Swallowing: A few with stimulation  Type of Nipple: Everted at rest and after stimulation  Comfort (Breast/Nipple): Soft / non-tender  Hold (Positioning): Assistance needed to correctly position infant at breast and maintain latch.  LATCH Score: 8  Interventions Interventions: Assisted with latch;Skin to skin;Breast compression;Adjust position;Support pillows;Breast massage;Hand express;Position options  Lactation Tools Discussed/Used     Consult Status Consult Status: Follow-up Date: 10/13/17 Follow-up type: In-patient    Huston Foley 10/12/2017, 11:06 AM

## 2017-10-13 ENCOUNTER — Encounter: Payer: No Typology Code available for payment source | Admitting: Obstetrics and Gynecology

## 2017-10-13 MED ORDER — IBUPROFEN 600 MG PO TABS: 600.0000 mg | ORAL_TABLET | Freq: Four times a day (QID) | ORAL | 0 refills | Status: DC

## 2017-10-13 NOTE — Discharge Instructions (Signed)

## 2017-10-13 NOTE — Lactation Note (Signed)
This note was copied from a baby's chart. Lactation Consultation Note  [name] [id number] Date: 10/13/2017 Reason for consult: Follow-up assessment;1st time breastfeeding;Primapara;Term  P1 mother whose infant is now 12 hours old  Mother had just finished breast feeding before I entered.  Baby was actively feeding for 25 minutes and mother heard swallows.  Baby was STS at the breast and sleeping.  Per mother, baby has to stool one more time prior to discharge.  She has not stooled since 0415 on 10/2.  I checked baby's diaper and she had not voided/stooled.  Mother began pumping with the manual pump and EBM flowing from breast.  Mother will immediately feed this back to baby after pumping.  I continued to encourage her to feed with cues and at least every 3 hours to help promote stooling.  Spoke with RN regarding situation and mother provided correct information.  She had no further questions/concerns for me.    Engorgement prevention/treatment discussed.  Mother has our OP number to call if questions arise after discharge.     Maternal Data Formula Feeding for Exclusion: No Has patient been taught Hand Expression?: Yes Does the patient have breastfeeding experience prior to this delivery?: No  Feeding Feeding Type: Breast Fed  LATCH Score Latch: Grasps breast easily, tongue down, lips flanged, rhythmical sucking.  Audible Swallowing: A few with stimulation  Type of Nipple: Everted at rest and after stimulation  Comfort (Breast/Nipple): Soft / non-tender  Hold (Positioning): Assistance needed to correctly position infant at breast and maintain latch.  LATCH Score: 8  Interventions    Lactation Tools Discussed/Used     Consult Status Consult Status: Complete Date: 10/13/17 Follow-up type: In-patient    Miciah Shealy R Adair Lauderback 10/13/2017, 11:14 AM

## 2017-10-13 NOTE — Discharge Summary (Signed)
Postpartum Discharge Summary     [name] DOB: 12-Nov-1995 MRN: [id number]  Date of admission: 10/10/2017 Delivering Provider: Tamera Stands   Date of discharge: 10/13/2017  Admitting diagnosis: 39WKS,LABOR Intrauterine pregnancy: [redacted]w[redacted]d     Secondary diagnosis:  Active Problems:   Obesity affecting pregnancy, antepartum   Pre-diabetes   Post-dates pregnancy   SVD (spontaneous vaginal delivery)  Additional problems: none     Discharge diagnosis: Term Pregnancy Delivered                                                                                                Post partum procedures:none  Augmentation: Pitocin  Complications: None  Hospital course:  Induction of Labor With Vaginal Delivery   22 y.o. yo G1P1001 at [redacted]w[redacted]d was admitted to the hospital 10/10/2017 for induction of labor.  Indication for induction: PROM.  Patient had an uncomplicated labor course as follows: Membrane Rupture Time/Date: 3:22 PM ,10/10/2017   Intrapartum Procedures: Episiotomy: None [1]                                         Lacerations:  Labial [10]  Patient had delivery of a Viable infant.  Information for the patient's newborn:  Aeryn, Medici [[id number]]  Delivery Method: Vag-Spont   10/11/2017  Details of delivery can be found in separate delivery note.  Patient had a routine postpartum course. Patient is discharged home 10/13/17.  Magnesium Sulfate recieved: No BMZ received: No  Physical exam  Vitals:   10/12/17 0543 10/12/17 1439 10/12/17 2338 10/13/17 0535  BP: 127/69 129/84 126/74 124/75  Pulse: 83 82 85 77  Resp: 18 18 18 18   Temp: 98.4 F (36.9 C) 98.2 F (36.8 C) 98.2 F (36.8 C) 98.1 F (36.7 C)  TempSrc: Oral  Oral Oral  SpO2:    99%  Weight:      Height:       General: alert, cooperative and no distress Lochia: appropriate Uterine Fundus: firm Incision: N/A DVT Evaluation: No evidence of DVT seen on physical exam. No cords or calf  tenderness. No significant calf/ankle edema. Labs: Lab Results  Component Value Date   WBC 8.7 10/10/2017   HGB 12.5 10/10/2017   HCT 37.8 10/10/2017   MCV 80.4 10/10/2017   PLT 205 10/10/2017   CMP Latest Ref Rng & Units 09/29/2015  Glucose 65 - 99 mg/dL 782(N)  BUN 6 - 20 mg/dL 8  Creatinine 5.62 - 1.30 mg/dL 8.65  Sodium 784 - 696 mmol/L 140  Potassium 3.5 - 5.1 mmol/L 3.5  Chloride 101 - 111 mmol/L 105  CO2 22 - 32 mmol/L 25  Calcium 8.9 - 10.3 mg/dL 9.8  Total Protein 6.5 - 8.1 g/dL 7.9  Total Bilirubin 0.3 - 1.2 mg/dL 0.6  Alkaline Phos 38 - 126 U/L 87  AST 15 - 41 U/L 22  ALT 14 - 54 U/L 14    Discharge instruction: per After Visit Summary and "Baby and Me  Booklet".  After visit meds:  Allergies as of 10/13/2017   No Known Allergies     Medication List    STOP taking these medications   butalbital-acetaminophen-caffeine 50-325-40 MG tablet Commonly known as:  FIORICET, ESGIC   diphenhydrAMINE 25 MG tablet Commonly known as:  BENADRYL   guaifenesin 100 MG/5ML syrup Commonly known as:  ROBITUSSIN   Vitamin D (Ergocalciferol) 50000 units Caps capsule Commonly known as:  DRISDOL     TAKE these medications   Breast Pump Misc Electric double pump - Breast pump for client to use while breastfeeding.   ibuprofen 600 MG tablet Commonly known as:  ADVIL,MOTRIN Take 1 tablet (600 mg total) by mouth every 6 (six) hours.   triamcinolone cream 0.1 % Commonly known as:  KENALOG Use twice a day for 7 days then off for 7 days   VITAFOL-NANO 18-0.6-0.4 MG Tabs Take 1 tablet by mouth daily.       Diet: routine diet  Activity: Advance as tolerated. Pelvic rest for 6 weeks.   Outpatient follow up:4 weeks Follow up Appt: Future Appointments  Date Time Provider Department Center  11/08/2017 10:00 AM Constant, Gigi Gin, MD [facility] None   Follow up Visit:No follow-ups on file.   Please schedule this patient for Postpartum visit in: 4 weeks with the following  provider: MD For C/S patients schedule nurse incision check in weeks 2 weeks: no Low risk pregnancy complicated by: none Delivery mode:  SVD Anticipated Birth Control:  Nexplanon PP Procedures needed: none  Schedule Integrated BH visit: no      Newborn Data: Live born female  Birth Weight: 7 lb 3.3 oz (3269 g) APGAR: 8, 9  Newborn Delivery   Birth date/time:  10/11/2017 06:00:00 Delivery type:  Vaginal, Spontaneous     Baby Feeding: Breast Disposition:home with mother   10/13/2017 Oralia Manis, DO  PGY-2

## 2017-10-20 ENCOUNTER — Inpatient Hospital Stay (HOSPITAL_COMMUNITY): Admission: RE | Admit: 2017-10-20 | Payer: No Typology Code available for payment source | Source: Ambulatory Visit

## 2017-11-08 ENCOUNTER — Encounter: Payer: Self-pay | Admitting: Obstetrics and Gynecology

## 2017-11-08 ENCOUNTER — Ambulatory Visit (INDEPENDENT_AMBULATORY_CARE_PROVIDER_SITE_OTHER): Payer: Medicaid Other | Admitting: Obstetrics and Gynecology

## 2017-11-08 DIAGNOSIS — Z1389 Encounter for screening for other disorder: Secondary | ICD-10-CM

## 2017-11-08 DIAGNOSIS — Z30430 Encounter for insertion of intrauterine contraceptive device: Secondary | ICD-10-CM

## 2017-11-08 DIAGNOSIS — Z3202 Encounter for pregnancy test, result negative: Secondary | ICD-10-CM

## 2017-11-08 LAB — POCT URINE PREGNANCY: Preg Test, Ur: NEGATIVE

## 2017-11-08 MED ORDER — LEVONORGESTREL 20 MCG/24HR IU IUD
INTRAUTERINE_SYSTEM | Freq: Once | INTRAUTERINE | Status: AC
  Administered 2017-11-08: 11:00:00 via INTRAUTERINE

## 2017-11-08 NOTE — Progress Notes (Signed)
Post Partum Exam  Jean Solis is a 22 y.o. G39P1001 female who presents for a postpartum visit. She is 4 weeks postpartum following a spontaneous vaginal delivery. I have fully reviewed the prenatal and intrapartum course. The delivery was at [redacted]w[redacted]d gestational weeks.  Anesthesia: epidural. Postpartum course has been UNREMARKABLE. Baby's course has been UNREMARKABLE. Baby is feeding by both breast and bottle - GERBER GOOD START. Marland Kitchen Bleeding no bleeding. Bowel function is normal. Bladder function is normal. Patient is not sexually active. Pt wants the Mirena. Contraception method is abstinence. Postpartum depression screening:neg  Last pap smear done 03/25/17 and was Normal  Review of Systems Pertinent items are noted in HPI.    Objective:  Blood pressure 122/74, pulse 92, weight 205 lb 6.4 oz (93.2 kg), unknown if currently breastfeeding.  General:  alert, cooperative and no distress   Breasts:  inspection negative, no nipple discharge or bleeding, no masses or nodularity palpable  Lungs: clear to auscultation bilaterally  Heart:  regular rate and rhythm  Abdomen: soft, non-tender; bowel sounds normal; no masses,  no organomegaly   Vulva:  normal  Vagina: normal vagina, no discharge, exudate, lesion, or erythema  Cervix:  multiparous appearance  Corpus: normal size, contour, position, consistency, mobility, non-tender  Adnexa:  normal adnexa and no mass, fullness, tenderness  Rectal Exam: Not performed.        Assessment:    Normal postpartum exam. Pap smear not done at today's visit.   Plan:   1. Contraception: IUD  IUD Procedure Note Patient identified, informed consent performed, signed copy in chart, time out was performed.  Urine pregnancy test negative.  Speculum placed in the vagina.  Cervix visualized.  Cleaned with Betadine x 2.  Grasped anteriorly with a single tooth tenaculum.  Uterus sounded to 8 cm.  Mirena IUD placed per manufacturer's recommendations.  Strings trimmed  to 3 cm. Tenaculum was removed, good hemostasis noted.  Patient tolerated procedure well.   Patient given post procedure instructions and Mirena care card with expiration date.  Patient is asked to check IUD strings periodically and follow up in 4-6 weeks for IUD check.   2. Patient is medically cleared to resume all activities of daily living 3. Follow up in: 4 weeks for IUD check or as needed.

## 2017-12-07 ENCOUNTER — Ambulatory Visit (INDEPENDENT_AMBULATORY_CARE_PROVIDER_SITE_OTHER): Payer: Medicaid Other | Admitting: Obstetrics and Gynecology

## 2017-12-07 ENCOUNTER — Encounter: Payer: Self-pay | Admitting: Obstetrics and Gynecology

## 2017-12-07 DIAGNOSIS — Z30431 Encounter for routine checking of intrauterine contraceptive device: Secondary | ICD-10-CM | POA: Insufficient documentation

## 2017-12-07 DIAGNOSIS — Z975 Presence of (intrauterine) contraceptive device: Secondary | ICD-10-CM | POA: Diagnosis not present

## 2017-12-07 NOTE — Patient Instructions (Signed)

## 2017-12-07 NOTE — Progress Notes (Signed)
RGYN here for IUD Check.  Inserted:11/08/17 by Dr.Constant.  Pt states she had bleeding after insertion but none since  Pt is breastfeeding /Pumping. Has not been sexually active. Pt has no complaints today.

## 2017-12-07 NOTE — Progress Notes (Signed)
Patient ID: Jean Solis, female   DOB: 1995/02/01, 22 y.o.   MRN: [id number] Here for IUD check Mirena placed 11/08/17. Doing well No bleeding Has not been sexual active since insertion. Last pap 3/19 normal  PE AF VSS Lungs clear Heart RRR Abd soft + BS GU Nl EGBUS white vaginal discharge IUD string noted  A/P IUD check up F/U PRN

## 2018-02-22 ENCOUNTER — Encounter: Payer: Self-pay | Admitting: Family Medicine

## 2018-02-22 ENCOUNTER — Other Ambulatory Visit (HOSPITAL_COMMUNITY)
Admission: RE | Admit: 2018-02-22 | Discharge: 2018-02-22 | Disposition: A | Discharge status: Home / Self Care | Payer: 59 | Source: Ambulatory Visit | Attending: Family Medicine | Admitting: Family Medicine

## 2018-02-22 ENCOUNTER — Ambulatory Visit (INDEPENDENT_AMBULATORY_CARE_PROVIDER_SITE_OTHER): Payer: 59 | Admitting: Family Medicine

## 2018-02-22 VITALS — BP 114/84 | HR 104 | Temp 98.0°F | Ht 65.0 in | Wt 214.6 lb

## 2018-02-22 DIAGNOSIS — A57 Chancroid: Secondary | ICD-10-CM | POA: Diagnosis not present

## 2018-02-22 DIAGNOSIS — N898 Other specified noninflammatory disorders of vagina: Secondary | ICD-10-CM

## 2018-02-22 MED ORDER — PENICILLIN G BENZATHINE 2400000 UNIT/4ML IM SUSP
2.4000 10*6.[IU] | Freq: Once | INTRAMUSCULAR | Status: AC
  Administered 2018-02-22: 2400000 [IU] via INTRAMUSCULAR

## 2018-02-22 MED ORDER — AZITHROMYCIN 500 MG PO TABS: 1000.0000 mg | ORAL_TABLET | Freq: Every day | ORAL | 0 refills | Status: DC

## 2018-02-22 MED ORDER — ACETAMINOPHEN-CODEINE #3 300-30 MG PO TABS: 1.0000 | ORAL_TABLET | ORAL | 0 refills | Status: DC | PRN

## 2018-02-22 NOTE — Progress Notes (Signed)
Patient: Jean Solis MRN: [id number] DOB: [birth year] PCP: Orland Mustard, MD     Subjective:  Chief Complaint  Patient presents with  . STD screening    HPI: The patient is a 23 y.o. female who presents today for STD screen. Last week she went to wipe herself at work and noticed she had a cut near her vagina. It is on the right side of her vagina. She has had no trauma. She also has a vaginal discharge that is not out of the ordinary for her, but she would like screened. She has had unprotected sex with one female partner. They are still together. She states "cut" is raw and very painful. Unsure if she has any other lesions. Denies any itching or burning of lesions. She does have tenderness in her right groin area as well. No tight panties, no pain with sex.   Review of Systems  Constitutional: Negative for chills and fever.  Genitourinary: Positive for vaginal discharge and vaginal pain. Negative for difficulty urinating, dyspareunia, dysuria, flank pain, menstrual problem and pelvic pain.  Musculoskeletal: Negative for back pain.  Skin: Positive for wound (right of vagina ).    Allergies Patient has No Known Allergies.  Past Medical History Patient  has a past medical history of Chlamydia (09/2011).  Surgical History Patient  has a past surgical history that includes Tonsillectomy and Bartholin cyst marsupialization (Right, 2012).  Family History Pateint's family history includes Diabetes in her maternal uncle; Hyperlipidemia in her father and mother; Hypertension in her father, maternal grandmother, and mother.  Social History Patient  reports that she has never smoked. She has never used smokeless tobacco. She reports that she does not drink alcohol or use drugs.    Objective: Vitals:   02/22/18 1125  BP: 114/84  Pulse: (!) 104  Temp: 98 F (36.7 C)  TempSrc: Oral  SpO2: 97%  Weight: 214 lb 9.6 oz (97.3 kg)  Height: 5\' 5"  (1.651 m)    Body mass index is 35.71  kg/m.  Physical Exam Vitals signs reviewed.  Constitutional:      Appearance: She is obese.  Cardiovascular:     Rate and Rhythm: Normal rate and regular rhythm.     Heart sounds: Normal heart sounds.  Pulmonary:     Effort: Pulmonary effort is normal.     Breath sounds: Normal breath sounds.  Abdominal:     General: Bowel sounds are normal.     Palpations: Abdomen is soft.  Genitourinary:    Comments: She has an ulcerated lesion on the right side of her vagina, just inferior to labia near perineum. It is coalesced into a longer lesion and appears ot have a red base, well defined borders and puss like material on the inside. She has 2 other small macular ulcerated lesion, one on the right labia and one on the left labia. No vesicles or clusters of lesions. She is TTP over her right groin. No large lymph nodes palpable.  No cervical motion tenderness. Blood tinged cervical discharge, no odor.  Skin:    Findings: Lesion (see GU) present.  Neurological:     Mental Status: She is alert.        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 Syphilis. Culture taken for hsv of lesion, but not characteristic of this. Also checking labs for rpr, hiv, and cervical swab for stds. Treating for both early syphilis and h.ducreyi prophylactically with pcn and 1g of azithromycin. Discussed no sex  until I get her labs back. Area is very painful and recommended she use ibuprofen and tylenol 3 for breakthrough pain. Keep clean and dry. Safe sex stressed.  - RPR - HIV Antibody (routine testing w rflx) - Herpes simplex virus culture - HSV(herpes simplex vrs) 1+2 ab-IgG - Cervicovaginal ancillary only( [place]) - Penicillin G Benzathine SUSP 2,400,000 Units  2. Vaginal discharge  - Cervicovaginal ancillary only( [place])    Return if symptoms worsen or fail to improve.   Orland MustardAllison Osei Anger, MD [facility]   02/22/2018

## 2018-02-22 NOTE — Patient Instructions (Signed)
Treating you for syphilis and h.ducrei. both are STD that can cause these very painful, ulcerating lesions called chancres.   Also giving you some pain pills-tylenol 3.   No sex until I get your testing back.

## 2018-02-23 LAB — CERVICOVAGINAL ANCILLARY ONLY
CHLAMYDIA, DNA PROBE: POSITIVE — AB
Neisseria Gonorrhea: NEGATIVE
Trichomonas: NEGATIVE

## 2018-02-23 LAB — HIV ANTIBODY (ROUTINE TESTING W REFLEX): HIV: NONREACTIVE

## 2018-02-23 LAB — HSV(HERPES SIMPLEX VRS) I + II AB-IGG
HAV 1 IGG,TYPE SPECIFIC AB: <0.9 index
HSV 2 IGG,TYPE SPECIFIC AB: <0.9 index

## 2018-02-23 LAB — RPR: RPR: NONREACTIVE

## 2018-02-24 ENCOUNTER — Other Ambulatory Visit: Payer: Self-pay

## 2018-02-24 ENCOUNTER — Ambulatory Visit: Payer: Medicaid Other | Admitting: Family Medicine

## 2018-02-24 ENCOUNTER — Telehealth: Payer: Self-pay | Admitting: Family Medicine

## 2018-02-24 DIAGNOSIS — A57 Chancroid: Secondary | ICD-10-CM

## 2018-02-24 NOTE — Telephone Encounter (Signed)
Copied from CRM [id number]. Topic: General - Other >> Feb 24, 2018  1:31 PM Tamela Oddi wrote: Reason for CRM: Patient called to speak with a nurse or doctor regarding her test results.  She stated that there was a message on My Chart, but she had some additional questions that she needed clarification on and would like a call back from the nurse or doctor.  CB# [phone]

## 2018-02-24 NOTE — Telephone Encounter (Signed)
See note

## 2018-02-26 ENCOUNTER — Encounter (HOSPITAL_COMMUNITY): Payer: Self-pay | Admitting: Nurse Practitioner

## 2018-02-26 ENCOUNTER — Emergency Department (HOSPITAL_COMMUNITY)
Admission: EM | Admit: 2018-02-26 | Discharge: 2018-02-26 | Disposition: A | Discharge status: Home / Self Care | Payer: 59 | Attending: Emergency Medicine | Admitting: Emergency Medicine

## 2018-02-26 DIAGNOSIS — N949 Unspecified condition associated with female genital organs and menstrual cycle: Secondary | ICD-10-CM

## 2018-02-26 DIAGNOSIS — B9689 Other specified bacterial agents as the cause of diseases classified elsewhere: Secondary | ICD-10-CM | POA: Diagnosis not present

## 2018-02-26 DIAGNOSIS — A749 Chlamydial infection, unspecified: Secondary | ICD-10-CM | POA: Insufficient documentation

## 2018-02-26 DIAGNOSIS — N760 Acute vaginitis: Secondary | ICD-10-CM | POA: Insufficient documentation

## 2018-02-26 DIAGNOSIS — N898 Other specified noninflammatory disorders of vagina: Secondary | ICD-10-CM

## 2018-02-26 LAB — URINALYSIS, ROUTINE W REFLEX MICROSCOPIC
Bilirubin Urine: NEGATIVE
Glucose, UA: NEGATIVE mg/dL
Ketones, ur: NEGATIVE mg/dL
Nitrite: NEGATIVE
PROTEIN: NEGATIVE mg/dL
Specific Gravity, Urine: 1.029 (ref 1.005–1.030)
pH: 6 (ref 5.0–8.0)

## 2018-02-26 LAB — WET PREP, GENITAL
Sperm: NONE SEEN
Trich, Wet Prep: NONE SEEN
Yeast Wet Prep HPF POC: NONE SEEN

## 2018-02-26 LAB — PREGNANCY, URINE: Preg Test, Ur: NEGATIVE

## 2018-02-26 MED ORDER — ACETAMINOPHEN 500 MG PO TABS
1000.0000 mg | ORAL_TABLET | Freq: Once | ORAL | Status: AC
  Administered 2018-02-26: 1000 mg via ORAL
  Filled 2018-02-26: qty 2

## 2018-02-26 MED ORDER — VALACYCLOVIR HCL 1 G PO TABS: 1000.0000 mg | ORAL_TABLET | Freq: Two times a day (BID) | ORAL | 0 refills | Status: AC

## 2018-02-26 MED ORDER — VALACYCLOVIR HCL 500 MG PO TABS
1000.0000 mg | ORAL_TABLET | Freq: Once | ORAL | Status: AC
  Administered 2018-02-26: 1000 mg via ORAL
  Filled 2018-02-26: qty 2

## 2018-02-26 MED ORDER — NAPROXEN 500 MG PO TABS
500.0000 mg | ORAL_TABLET | Freq: Once | ORAL | Status: AC
  Administered 2018-02-26: 500 mg via ORAL
  Filled 2018-02-26: qty 1

## 2018-02-26 MED ORDER — METRONIDAZOLE 500 MG PO TABS: 500.0000 mg | ORAL_TABLET | Freq: Two times a day (BID) | ORAL | 0 refills | Status: DC

## 2018-02-26 NOTE — ED Provider Notes (Signed)
[facility]-EMERGENCY DEPT Provider Note   CSN: [id number] Arrival date & time: 02/26/18  1531     History   Chief Complaint Chief Complaint  Patient presents with  . Vaginal Pain    HPI Jean Solis is a 23 y.o. female.  Jean Solis is a 23 y.o. female with a history of recently diagnosed chlamydia, otherwise healthy, presents to the emergency department for evaluation of painful genital lesions.  Patient reports she initially saw her primary care doctor regarding this on 12/2, at this time she had 1 lesion noted on the right labia, which was painful to touch.  PCP describe it as an ulceration with purulent fluid.  STD testing was collected and patient was prophylactically treated with azithromycin and penicillin for chlamydia and syphilis.  Patient reports her primary care doctor did not think this was consistent with herpes, and she has never had a herpes outbreak before, her partner has not reported any herpes lesions or some other symptoms, but over the past few days since seen her primary care doctor she has had more lesions pop up and they have become increasingly painful.  She also reports some vaginal discharge, reports PCP completed an external exam, but did not complete speculum exam due to the painful lesion present on the labia.  She denies any associated abdominal pain, no fevers or chills.  She reports when she urinates it causes the lesions to burn, but does not have any pain with urination itself.  Regular menstrual periods reported.  No other aggravating or alleviating factors.     Past Medical History:  Diagnosis Date  . Chlamydia 09/2011    Patient Active Problem List   Diagnosis Date Noted  . IUD check up 12/07/2017  . IUD contraception 12/07/2017  . Pre-diabetes 07/20/2017  . Obesity (BMI 30.0-34.9) 04/07/2017  . Vitamin D deficiency 03/28/2017  . Obesity affecting pregnancy, antepartum 03/23/2017  . Eczema 12/27/2011    Past  Surgical History:  Procedure Laterality Date  . BARTHOLIN CYST MARSUPIALIZATION Right 2012  . TONSILLECTOMY       OB History    Gravida  1   Para  1   Term  1   Preterm  0   AB  0   Living  1     SAB  0   TAB  0   Ectopic  0   Multiple  0   Live Births  1            Home Medications    Prior to Admission medications   Medication Sig Start Date End Date Taking? Authorizing Provider  acetaminophen-codeine (TYLENOL #3) 300-30 MG tablet Take 1 tablet by mouth every 4 (four) hours as needed for moderate pain. 02/22/18  Yes Orland Mustard, MD  ibuprofen (ADVIL,MOTRIN) 200 MG tablet Take 600 mg by mouth daily as needed for moderate pain.   Yes [provider]  levonorgestrel (MIRENA) 20 MCG/24HR IUD 1 each by Intrauterine route once.   Yes [provider]  azithromycin (ZITHROMAX) 500 MG tablet Take 2 tablets (1,000 mg total) by mouth daily. Patient not taking: Reported on 02/26/2018 02/22/18   Orland Mustard, MD  metroNIDAZOLE (FLAGYL) 500 MG tablet Take 1 tablet (500 mg total) by mouth 2 (two) times daily. One po bid x 7 days 02/26/18   Dartha Lodge, PA-C  triamcinolone cream (KENALOG) 0.1 % Use twice a day for 7 days then off for 7 days Patient not taking:  Reported on 02/26/2018 07/20/17   Orland Mustard, MD  valACYclovir (VALTREX) 1000 MG tablet Take 1 tablet (1,000 mg total) by mouth 2 (two) times daily for 10 days. 02/26/18 03/08/18  Dartha Lodge, PA-C    Family History Family History  Problem Relation Age of Onset  . Hypertension Mother   . Hyperlipidemia Mother   . Hyperlipidemia Father   . Hypertension Father   . Hypertension Maternal Grandmother   . Diabetes Maternal Uncle     Social History Social History   Tobacco Use  . Smoking status: Never Smoker  . Smokeless tobacco: Never Used  Substance Use Topics  . Alcohol use: No    Frequency: Never    Comment: No alcohol since pregnancy  . Drug use: No     Allergies   Patient  has no known allergies.   Review of Systems Review of Systems  Constitutional: Negative for chills and fever.  HENT: Negative.   Respiratory: Negative for cough and shortness of breath.   Cardiovascular: Negative for chest pain.  Gastrointestinal: Negative for abdominal pain, constipation, diarrhea, nausea and vomiting.  Genitourinary: Positive for genital sores, vaginal discharge and vaginal pain. Negative for dysuria, frequency and vaginal bleeding.  Musculoskeletal: Negative for arthralgias and myalgias.  Skin: Negative for color change and rash.     Physical Exam Updated Vital Signs BP (!) 147/95 (BP Location: Right Arm)   Pulse (!) 101   Temp 99.2 F (37.3 C) (Oral)   Resp 16   LMP 02/19/2018   SpO2 100%   Physical Exam Vitals signs and nursing note reviewed. Exam conducted with a chaperone present.  Constitutional:      General: She is not in acute distress.    Appearance: Normal appearance. She is well-developed. She is obese. She is not ill-appearing or diaphoretic.  HENT:     Head: Normocephalic and atraumatic.     Mouth/Throat:     Mouth: Mucous membranes are moist.     Pharynx: Oropharynx is clear.     Comments: No intraoral or perioral lesions noted Eyes:     General:        Right eye: No discharge.        Left eye: No discharge.  Pulmonary:     Effort: Pulmonary effort is normal. No respiratory distress.  Abdominal:     General: Abdomen is flat. Bowel sounds are normal. There is no distension.     Palpations: Abdomen is soft. There is no mass.     Tenderness: There is no abdominal tenderness. There is no guarding.     Comments: Abdomen soft, nondistended, nontender to palpation in all quadrants without guarding or peritoneal signs  Genitourinary:    Comments: Chaperone present during pelvic exam. Multiple ulcerations noted over both labia and perineum that are painful to palpation, many of these lesions appear to be coalescing, some of these lesions have  an erythematous base and others with a small amount of yellow purulent appearing fluid, there are also a few clustered vesicular lesions noted..  Patient also noted to have moderate amount of vaginal discharge, so proceeded with speculum exam which revealed thin gray discharge pooled in the vaginal vault, cervical os is closed without any cervical lesions.  On bimanual exam patient did not have any cervical motion tenderness or focal adnexal tenderness or masses. Musculoskeletal:        General: No deformity.  Skin:    General: Skin is warm and dry.  Neurological:  Mental Status: She is alert.     Coordination: Coordination normal.  Psychiatric:        Mood and Affect: Mood normal.        Behavior: Behavior normal.      ED Treatments / Results  Labs (all labs ordered are listed, but only abnormal results are displayed) Labs Reviewed  WET PREP, GENITAL - Abnormal; Notable for the following components:      Result Value   Clue Cells Wet Prep HPF POC PRESENT (*)    WBC, Wet Prep HPF POC FEW (*)    All other components within normal limits  URINALYSIS, ROUTINE W REFLEX MICROSCOPIC - Abnormal; Notable for the following components:   Hgb urine dipstick SMALL (*)    Leukocytes,Ua TRACE (*)    Bacteria, UA RARE (*)    All other components within normal limits  PREGNANCY, URINE  GC/CHLAMYDIA PROBE AMP ([place]) NOT AT [facility]    EKG None  Radiology No results found.  Procedures Procedures (including critical care time)  Medications Ordered in ED Medications  naproxen (NAPROSYN) tablet 500 mg (500 mg Oral Given 02/26/18 1926)  valACYclovir (VALTREX) tablet 1,000 mg (1,000 mg Oral Given 02/26/18 1926)  acetaminophen (TYLENOL) tablet 1,000 mg (1,000 mg Oral Given 02/26/18 2058)     Initial Impression / Assessment and Plan / ED Course  I have reviewed the triage vital signs and the nursing notes.  Pertinent labs & imaging results that were available during my care of the  patient were reviewed by me and considered in my medical decision making (see chart for details).  Patient presents for evaluation of painful genital lesions.  Was initially seen by her PCP a few days ago and had 1 lesion present at the time, was treated for chlamydia and possible syphilis, but since then lesions have worsened she now has nothing more present on the vulva and perineum and they have become increasingly painful.  Patient also experiencing vaginal discharge.  No associated abdominal pain, fevers, vomiting.  On exam patient has numerous painful lesions with some new appearing vesicular lesions.  Presentation is concerning for severe case of genital herpes.  Patient also having a moderate amount of vaginal discharge, wet prep and cervical swabs collected which showed bacterial vaginosis.  In addition to starting patient on Valtrex, will treat with Flagyl.  Will have patient follow-up with OB/GYN and/or PCP for further evaluation especially if lesions are not improving.  Return precautions discussed.  Pelvic exam not concerning for PID.  Patient discharged home in good condition.  Final Clinical Impressions(s) / ED Diagnoses   Final diagnoses:  Genital lesion, female  Vaginal discharge  Bacterial vaginosis    ED Discharge Orders         Ordered    valACYclovir (VALTREX) 1000 MG tablet  2 times daily     02/26/18 2038    metroNIDAZOLE (FLAGYL) 500 MG tablet  2 times daily     02/26/18 2038           Dartha LodgeFord, Sheba Whaling N, [place] JerseyPA-C 03/01/18 1735    Terrilee FilesButler, Michael C, MD 03/02/18 1314

## 2018-02-26 NOTE — Discharge Instructions (Signed)
Your genital lesions may be herpes, please take Valtrex twice daily for the next 10 days and see if this improves these lesions especially since the treatment your regular doctor gave you did not seem to help and they have increased.  Your wet prep also showed evidence of bacterial vaginosis today, take Flagyl for the next week, do not drink alcohol while taking this medication as it can cause severe nausea and vomiting.  If lesions are still not improving please follow-up with your primary care doctor or your gynecologist for further evaluation.  You have chlamydia and gonorrhea testing pending today and we contacted by phone if any of these results are positive.  Please do not have sexual intercourse until your lesions have resolved already follow-up with your primary care doctor.  Your partner should be tested and treated as well.

## 2018-02-26 NOTE — ED Notes (Signed)
Pt verbalized discharge instructions and follow up care. Alert and ambulatory. Leaving with visitor. 

## 2018-02-26 NOTE — ED Triage Notes (Signed)
Pt is c/o vaginal pain related to what she refers to as "blisters on her labia and perineum." She states she had an STD work at her PCP but what they initially told her was a chancroid is turning in to more blisters. She is awaiting test results for all the text she had done at that visit albeit it sounds like she was empirically treated for STDs. She is requesting re-evaluation.

## 2018-02-27 LAB — GC/CHLAMYDIA PROBE AMP (~~LOC~~) NOT AT ARMC
Chlamydia: POSITIVE — AB
Neisseria Gonorrhea: NEGATIVE

## 2018-02-27 NOTE — Telephone Encounter (Signed)
Called patient on 2/14 and discussed all lab results w/patient.  See lab note dated 2/12.

## 2018-02-28 ENCOUNTER — Telehealth: Payer: Self-pay | Admitting: Obstetrics and Gynecology

## 2018-02-28 LAB — HERPES SIMPLEX VIRUS CULTURE
MICRO NUMBER:: 186056
SPECIMEN QUALITY:: ADEQUATE

## 2018-02-28 LAB — TIQ- AMBIGUOUS ORDER

## 2018-02-28 NOTE — Telephone Encounter (Signed)
Called and left a message for patient to call back to schedule an "urgent" new patient doctor referral appointment with our office to see Dr. Oscar La for: Chancroid in female.  Cc: Hayley for FYI.

## 2018-03-01 ENCOUNTER — Encounter: Payer: Self-pay | Admitting: Obstetrics and Gynecology

## 2018-03-01 ENCOUNTER — Ambulatory Visit (INDEPENDENT_AMBULATORY_CARE_PROVIDER_SITE_OTHER): Payer: 59 | Admitting: Obstetrics and Gynecology

## 2018-03-01 ENCOUNTER — Other Ambulatory Visit: Payer: Self-pay

## 2018-03-01 ENCOUNTER — Encounter: Payer: Self-pay | Admitting: Family Medicine

## 2018-03-01 ENCOUNTER — Telehealth: Payer: Self-pay

## 2018-03-01 VITALS — BP 120/80 | HR 88 | Ht 65.0 in | Wt 214.6 lb

## 2018-03-01 DIAGNOSIS — A6000 Herpesviral infection of urogenital system, unspecified: Secondary | ICD-10-CM | POA: Insufficient documentation

## 2018-03-01 DIAGNOSIS — A749 Chlamydial infection, unspecified: Secondary | ICD-10-CM | POA: Diagnosis not present

## 2018-03-01 MED ORDER — LIDOCAINE 5 % EX OINT: 1.0000 "application " | TOPICAL_OINTMENT | Freq: Four times a day (QID) | CUTANEOUS | 0 refills | Status: DC | PRN

## 2018-03-01 MED ORDER — OXYCODONE-ACETAMINOPHEN 5-325 MG PO TABS: 1.0000 | ORAL_TABLET | ORAL | 0 refills | Status: AC | PRN

## 2018-03-01 NOTE — Progress Notes (Signed)
GYNECOLOGY  VISIT   HPI: 23 y.o.   Single Black or African American Not Hispanic or Latino  female   [id number] with Patient's last menstrual period was 02/19/2018 (exact date).   here for recheck. Last week she was seen with a severe case of genital HSV. She is feeling much better, still sore, but she can sit down and function.     GYNECOLOGIC HISTORY: Patient's last menstrual period was 02/19/2018 (exact date). Contraception:IUD Menopausal hormone therapy: None        OB History    Gravida  1   Para  1   Term  1   Preterm  0   AB  0   Living  1     SAB  0   TAB  0   Ectopic  0   Multiple  0   Live Births  1              Patient Active Problem List   Diagnosis Date Noted  . Genital herpes 03/01/2018  . IUD contraception 12/07/2017  . Pre-diabetes 07/20/2017  . Obesity (BMI 30.0-34.9) 04/07/2017  . Vitamin D deficiency 03/28/2017  . Obesity affecting pregnancy, antepartum 03/23/2017  . Eczema 12/27/2011    Past Medical History:  Diagnosis Date  . Chlamydia 09/2011  . STD (sexually transmitted disease)     Past Surgical History:  Procedure Laterality Date  . BARTHOLIN CYST MARSUPIALIZATION Right 2012  . TONSILLECTOMY      Current Outpatient Medications  Medication Sig Dispense Refill  . acetaminophen-codeine (TYLENOL #3) 300-30 MG tablet Take 1 tablet by mouth every 4 (four) hours as needed for moderate pain. 20 tablet 0  . ibuprofen (ADVIL,MOTRIN) 200 MG tablet Take 600 mg by mouth daily as needed for moderate pain.    Marland Kitchen levonorgestrel (MIRENA) 20 MCG/24HR IUD 1 each by Intrauterine route once.    . lidocaine (XYLOCAINE) 5 % ointment Apply 1 application topically 4 (four) times daily as needed. 30 g 0  . valACYclovir (VALTREX) 1000 MG tablet Take 1 tablet (1,000 mg total) by mouth 2 (two) times daily for 10 days. 20 tablet 0  . azithromycin (ZITHROMAX) 500 MG tablet Take 2 tablets (1,000 mg total) by mouth daily. (Patient not taking: Reported on  02/26/2018) 2 tablet 0  . metroNIDAZOLE (FLAGYL) 500 MG tablet Take 1 tablet (500 mg total) by mouth 2 (two) times daily. One po bid x 7 days (Patient not taking: Reported on 03/07/2018) 14 tablet 0   No current facility-administered medications for this visit.      ALLERGIES: Patient has no known allergies.  Family History  Problem Relation Age of Onset  . Hypertension Mother   . Hyperlipidemia Mother   . Hyperlipidemia Father   . Hypertension Father   . Hypertension Maternal Grandmother   . Diabetes Maternal Uncle     Social History   Socioeconomic History  . Marital status: Single    Spouse name: Not on file  . Number of children: 0  . Years of education: 62  . Highest education level: Not on file  Occupational History    Employer: [employer]  Social Needs  . Financial resource strain: Not on file  . Food insecurity:    Worry: Not on file    Inability: Not on file  . Transportation needs:    Medical: Not on file    Non-medical: Not on file  Tobacco Use  . Smoking status: Never Smoker  . Smokeless  tobacco: Never Used  Substance and Sexual Activity  . Alcohol use: Yes    Alcohol/week: 1.0 standard drinks    Types: 1 Standard drinks or equivalent per week    Frequency: Never  . Drug use: No  . Sexual activity: Yes    Birth control/protection: I.U.D.  Lifestyle  . Physical activity:    Days per week: Not on file    Minutes per session: Not on file  . Stress: Not on file  Relationships  . Social connections:    Talks on phone: Not on file    Gets together: Not on file    Attends religious service: Not on file    Active member of club or organization: Not on file    Attends meetings of clubs or organizations: Not on file    Relationship status: Not on file  . Intimate partner violence:    Fear of current or ex partner: Not on file    Emotionally abused: Not on file    Physically abused: Not on file    Forced sexual activity: Not on file  Other Topics  Concern  . Not on file  Social History Narrative  . Not on file    Review of Systems  Constitutional: Negative.   HENT: Negative.   Eyes: Negative.   Respiratory: Negative.   Cardiovascular: Negative.   Gastrointestinal: Negative.   Genitourinary:       Vaginal lesion with tenderness  Musculoskeletal: Negative.   Skin: Negative.   Neurological: Negative.   Endo/Heme/Allergies: Negative.   Psychiatric/Behavioral: Negative.     PHYSICAL EXAMINATION:    BP 120/88 (BP Location: Right Arm, Patient Position: Sitting, Cuff Size: Large)   Pulse 64   Wt 211 lb 6.4 oz (95.9 kg)   LMP 02/19/2018 (Exact Date)   BMI 35.18 kg/m     General appearance: alert, cooperative and appears stated age  Pelvic: External genitalia:  Multiple ulcers improving. No signs of secondary infection.               Urethra:  normal appearing urethra with no masses, tenderness or lesions              Bartholins and Skenes: normal                 Chaperone was present for exam.  ASSESSMENT Severe primary HSV outbreak, improving    PLAN Continue valtrex Script for prn valtrex use called in Call with any concerns.   An After Visit Summary was printed and given to the patient.

## 2018-03-01 NOTE — Telephone Encounter (Signed)
Left message to call [name] at [phone].  Per review of PCP notes and lab results patient does not need to be seen today for referral unless she would like to keep her appointment. The patient has been notified of all her test results by her PCP and was treated by her PCP and the ED for her symptoms.

## 2018-03-01 NOTE — Patient Instructions (Signed)
Chlamydia, Female Chlamydia is an STD (sexually transmitted disease). It is a bacterial infection that spreads (is contagious) through sexual contact. Chlamydia can occur in different areas of the body, including:  The tube that moves urine from the bladder out of the body (urethra).  The lower part of the uterus (cervix).  The throat.  The rectum. This condition is not difficult to treat. However, if left untreated, chlamydia can lead to more serious health problems, including pelvic inflammatory disorder (PID). PID can increase your risk of not being able to have children (sterility). Also, if chlamydia is left untreated and you are pregnant or become pregnant, there is a chance that your baby can become infected during delivery. This may cause serious health problems for the baby. What are the causes? Chlamydia is caused by the bacteria Chlamydia trachomatis. It is passed from an infected partner during sexual activity. Chlamydia can spread through contact with the genitals, mouth, or rectum. What are the signs or symptoms? In some cases, there may not be any symptoms for this condition (asymptomatic), especially early in the infection. If symptoms develop, they may include:  Burning with urination.  Frequent urination.  Vaginal discharge.  Redness, soreness, and swelling (inflammation) of the rectum.  Bleeding or discharge from the rectum.  Abdominal pain.  Pain during sexual intercourse.  Bleeding between menstrual periods.  Itching, burning, or redness in the eyes, or discharge from the eyes. How is this diagnosed? This condition may be diagnosed with:  Urine tests.  Swab tests. Depending on your symptoms, your health care provider may use a cotton swab to collect discharge from your vagina or rectum to test for the bacteria.  A pelvic exam. How is this treated? This condition is treated with antibiotic medicines. If you are pregnant, certain types of antibiotics will  need to be avoided. Follow these instructions at home: Medicines  Take over-the-counter and prescription medicines only as told by your health care provider.  Take your antibiotic medicine as told by your health care provider. Do not stop taking the antibiotic even if you start to feel better. Sexual activity  Tell sexual partners about your infection. This includes any oral, anal, or vaginal sex partners you have had within 60 days of when your symptoms started. Sexual partners should also be treated, even if they have no signs of the disease.  Do not have sex until you and your sexual partners have completed treatment and your health care provider says it is okay. If your health care provider prescribed you a single dose treatment, wait 7 days after taking the treatment before having sex. General instructions  It is your responsibility to get your test results. Ask your health care provider, or the department performing the test, when your results will be ready.  Get plenty of rest.  Eat a healthy, well-balanced diet.  Drink enough fluids to keep your urine clear or pale yellow.  Keep all follow-up visits as told by your health care provider. This is important. You may need to be tested for infection again 3 months after treatment. How is this prevented? The only sure way to prevent chlamydia is to avoid having sex. However, you can lower your risk by:  Using latex condoms correctly every time you have sex.  Not having multiple sexual partners.  Asking if your sexual partner has been tested for STIs and had negative results. Contact a health care provider if:  You develop new symptoms or your symptoms do not get   better after completing treatment.  You have a fever or chills.  You have pain during sexual intercourse. Get help right away if:  Your pain gets worse and does not get better with medicine.  You develop flu-like symptoms, such as night sweats, sore throat, or  muscle aches.  You experience nausea or vomiting.  You have difficulty swallowing.  You have bleeding between periods or after sex.  You have irregular menstrual periods.  You have abdominal or lower back pain that does not get better with medicine.  You feel weak or dizzy, or you faint.  You are pregnant and you develop symptoms of chlamydia. Summary  Chlamydia is an STD (sexually transmitted disease). It is a bacterial infection that spreads (is contagious) through sexual contact.  This condition is not difficult to treat, however. If left untreated, chlamydia can lead to more serious health problems, including pelvic inflammatory disease (PID).  In some cases, there may not be any symptoms for this condition (asymptomatic).  This condition is treated with antibiotic medicines.  Using latex condoms correctly every time you have sex can help prevent chlamydia. This information is not intended to replace advice given to you by your health care provider. Make sure you discuss any questions you have with your health care provider. Document Released: 10/07/2004 Document Revised: 06/21/2017 Document Reviewed: 12/15/2015 Elsevier Interactive Patient Education  2019 Elsevier Inc. Genital Herpes Genital herpes is a common sexually transmitted infection (STI) that is caused by a virus. The virus spreads from person to person through sexual contact. Infection can cause itching, blisters, and sores around the genitals or rectum. Symptoms may last several days and then go away This is called an outbreak. However, the virus remains in your body, so you may have more outbreaks in the future. The time between outbreaks varies and can be months or years. Genital herpes affects men and women. It is particularly concerning for pregnant women because the virus can be passed to the baby during delivery and can cause serious problems. Genital herpes is also a concern for people who have a weak  disease-fighting (immune) system. What are the causes? This condition is caused by the herpes simplex virus (HSV) type 1 or type 2. The virus may spread through:  Sexual contact with an infected person, including vaginal, anal, and oral sex.  Contact with fluid from a herpes sore.  The skin. This means that you can get herpes from an infected partner even if he or she does not have a visible sore or does not know that he or she is infected. What increases the risk? You are more likely to develop this condition if:  You have sex with many partners.  You do not use latex condoms during sex. What are the signs or symptoms? Most people do not have symptoms (asymptomatic) or have mild symptoms that may be mistaken for other skin problems. Symptoms may include:  Small red bumps near the genitals, rectum, or mouth. These bumps turn into blisters and then turn into sores.  Flu-like symptoms, including: ? Fever. ? Body aches. ? Swollen lymph nodes. ? Headache.  Painful urination.  Pain and itching in the genital area or rectal area.  Vaginal discharge.  Tingling or shooting pain in the legs and buttocks. Generally, symptoms are more severe and last longer during the first (primary) outbreak. Flu-like symptoms are also more common during the primary outbreak. How is this diagnosed? Genital herpes may be diagnosed based on:  A physical exam.  Your medical history.  Blood tests.  A test of a fluid sample (culture) from an open sore. How is this treated? There is no cure for this condition, but treatment with antiviral medicines that are taken by mouth (orally) can do the following:  Speed up healing and relieve symptoms.  Help to reduce the spread of the virus to sexual partners.  Limit the chance of future outbreaks, or make future outbreaks shorter.  Lessen symptoms of future outbreaks. Your health care provider may also recommend pain relief medicines, such as aspirin or  ibuprofen. Follow these instructions at home: Sexual activity  Do not have sexual contact during active outbreaks.  Practice safe sex. Latex condoms and female condoms may help prevent the spread of the herpes virus. General instructions  Keep the affected areas dry and clean.  Take over-the-counter and prescription medicines only as told by your health care provider.  Avoid rubbing or touching blisters and sores. If you do touch blisters or sores: ? Wash your hands thoroughly with soap and water. ? Do not touch your eyes afterward.  To help relieve pain or itching, you may take the following actions as directed by your health care provider: ? Apply a cold, wet cloth (cold compress) to affected areas 4-6 times a day. ? Apply a substance that protects your skin and reduces bleeding (astringent). ? Apply a gel that helps relieve pain around sores (lidocaine gel). ? Take a warm, shallow bath that cleans the genital area (sitz bath).  Keep all follow-up visits as told by your health care provider. This is important. How is this prevented?  Use condoms. Although anyone can get genital herpes during sexual contact, even with the use of a condom, a condom can provide some protection.  Avoid having multiple sexual partners.  Talk with your sexual partner about any symptoms either of you may have. Also, talk with your partner about any history of STIs.  Get tested for STIs before you have sex. Ask your partner to do the same.  Do not have sexual contact if you have symptoms of genital herpes. Contact a health care provider if:  Your symptoms are not improving with medicine.  Your symptoms return.  You have new symptoms.  You have a fever.  You have abdominal pain.  You have redness, swelling, or pain in your eye.  You notice new sores on other parts of your body.  You are a woman and experience bleeding between menstrual periods.  You have had herpes and you become  pregnant or plan to become pregnant. Summary  Genital herpes is a common sexually transmitted infection (STI) that is caused by the herpes simplex virus (HSV) type 1 or type 2.  These viruses are most often spread through sexual contact with an infected person.  You are more likely to develop this condition if you have sex with many partners or you have unprotected sex.  Most people do not have symptoms (asymptomatic) or have mild symptoms that may be mistaken for other skin problems. Symptoms occur as outbreaks that may happen months or years apart.  There is no cure for this condition, but treatment with oral antiviral medicines can reduce symptoms, reduce the chance of spreading the virus to a partner, prevent future outbreaks, or shorten future outbreaks. This information is not intended to replace advice given to you by your health care provider. Make sure you discuss any questions you have with your health care provider. Document Released: 12/26/1999 Document Revised:  11/28/2015 Document Reviewed: 11/28/2015 Elsevier Interactive Patient Education  [employer]2019 Elsevier Inc[employer]

## 2018-03-01 NOTE — Progress Notes (Signed)
23 y.o. G1P1001 Single Black or African American Not Hispanic or Latino female here for a consultation from Dr Artis Flock for vaginal sores.  She was originally seen on 02/22/18 by Dr Artis Flock with an atypical appearing vulvar ulcer, HSV culture was ultimately returned as positive, also positive for chlamydia, treated with azithromycin, also given PCN.  She was seen in the ER on 2/16, +BV, treated with Flagyl, the ER MD also started her on Valtrex.  Started the Valtrex on the 16th, thinks it may be stabilizing the infection. Not getting better yet.  She originally noticed a slight irritation the first week of February. Got worse with time. Originally one lesion, progressively worsened with time.  She has a new partner for the last 5 months, he doesn't have any symptoms of STD's. Denies a h/o HSV.  No fevers, no abdominal pain. She is very sore from the herpes, her groin was very tender on the right, that has improved. Hard to sit, walk, hard to void.  She is missing work. Worried she will lose her job.  She is not sleeping, pain is awful, tylenol with codeine and ibuprofen aren't helping   Period Cycle (Days): 28 Period Duration (Days): 7 days Period Pattern: Regular Menstrual Flow: Moderate Menstrual Control: Tampon Menstrual Control Change Freq (Hours): changes tampon every 4 hours Dysmenorrhea: None  Patient's last menstrual period was 02/19/2018 (exact date).          Sexually active: Yes.    The current method of family planning is IUD.    Exercising: No.  The patient does not participate in regular exercise at present. Smoker:  no  Health Maintenance: Pap:  03/23/2017 WNL History of abnormal Pap:  no TDaP: 08/10/2017 Gardasil: Unsure   reports that she has never smoked. She has never used smokeless tobacco. She reports current alcohol use of about 1.0 standard drinks of alcohol per week. She reports that she does not use drugs. She has a 12 month old daughter. She works at [employer].    Past Medical History:  Diagnosis Date  . Chlamydia 09/2011  . STD (sexually transmitted disease)     Past Surgical History:  Procedure Laterality Date  . BARTHOLIN CYST MARSUPIALIZATION Right 2012  . TONSILLECTOMY      Current Outpatient Medications  Medication Sig Dispense Refill  . acetaminophen-codeine (TYLENOL #3) 300-30 MG tablet Take 1 tablet by mouth every 4 (four) hours as needed for moderate pain. 20 tablet 0  . ibuprofen (ADVIL,MOTRIN) 200 MG tablet Take 600 mg by mouth daily as needed for moderate pain.    Marland Kitchen levonorgestrel (MIRENA) 20 MCG/24HR IUD 1 each by Intrauterine route once.    . metroNIDAZOLE (FLAGYL) 500 MG tablet Take 1 tablet (500 mg total) by mouth 2 (two) times daily. One po bid x 7 days 14 tablet 0  . valACYclovir (VALTREX) 1000 MG tablet Take 1 tablet (1,000 mg total) by mouth 2 (two) times daily for 10 days. 20 tablet 0  . azithromycin (ZITHROMAX) 500 MG tablet Take 2 tablets (1,000 mg total) by mouth daily. (Patient not taking: Reported on 02/26/2018) 2 tablet 0   No current facility-administered medications for this visit.     Family History  Problem Relation Age of Onset  . Hypertension Mother   . Hyperlipidemia Mother   . Hyperlipidemia Father   . Hypertension Father   . Hypertension Maternal Grandmother   . Diabetes Maternal Uncle     Review of Systems  Constitutional: Negative.  HENT: Negative.   Eyes: Negative.   Respiratory: Negative.   Cardiovascular: Negative.   Gastrointestinal: Negative.   Endocrine: Negative.   Genitourinary:       Vaginal sores  Musculoskeletal: Negative.   Skin: Negative.   Allergic/Immunologic: Negative.   Neurological: Negative.   Hematological: Negative.   Psychiatric/Behavioral: Negative.     Exam:   BP 120/80 (BP Location: Right Arm, Patient Position: Sitting, Cuff Size: Large)   Pulse 88   Ht 5\' 5"  (1.651 m)   Wt 214 lb 9.6 oz (97.3 kg)   LMP 02/19/2018 (Exact Date)   BMI 35.71 kg/m    Weight change: @WEIGHTCHANGE @ Height:   Height: 5\' 5"  (165.1 cm)  Ht Readings from Last 3 Encounters:  03/01/18 5\' 5"  (1.651 m)  02/22/18 5\' 5"  (1.651 m)  12/07/17 5\' 5"  (1.651 m)    General appearance: alert, cooperative and appears stated age No abnormal inguinal nodes palpated, mildly tender on the right    Pelvic: External genitalia:  Severe extensive ulcerations, c/w hsv, multiple ulcers seen, coalescing posteriorly              Urethra:  normal appearing urethra with no masses, tenderness or lesions              Chaperone was present for exam.  A:  Severe primary HSV outbreak  Recently treated for Chlamydia and BV as well as HSV, other STD testing negative  Partner has also been treated for chlamydia  P:   Continue Valtrex  Lidocaine ointment  Percocet (checked narcotic database, only other narcotic is the tylenol with codeine)  Void in water (sitz bath, tub, shower)  Call if she can't void, occasionally a catheter needs to be placed  Note for work   F/U on Tuesday (she will cancel if she is better)  Discussed HSV, recommended she use condoms when she is sexually active again  She has f/u testing for Chlamydia testing scheduled with Dr Artis Flock  Discussed the risks of tubal scarring with chlamydia (and prior h/o gonorrhea), she should have early monitoring in future pregnancy.   ~30 minutes spent face to face with the patient, over 50% in counseling

## 2018-03-06 NOTE — Telephone Encounter (Signed)
Patient was seen in the office on 03/01/2018 with Dr.Jertson. Encounter closed.

## 2018-03-07 ENCOUNTER — Other Ambulatory Visit: Payer: Self-pay

## 2018-03-07 ENCOUNTER — Encounter: Payer: Self-pay | Admitting: Obstetrics and Gynecology

## 2018-03-07 ENCOUNTER — Ambulatory Visit (INDEPENDENT_AMBULATORY_CARE_PROVIDER_SITE_OTHER): Payer: 59 | Admitting: Obstetrics and Gynecology

## 2018-03-07 VITALS — BP 120/88 | HR 64 | Wt 211.4 lb

## 2018-03-07 DIAGNOSIS — A6000 Herpesviral infection of urogenital system, unspecified: Secondary | ICD-10-CM | POA: Diagnosis not present

## 2018-03-07 MED ORDER — VALACYCLOVIR HCL 500 MG PO TABS: ORAL_TABLET | ORAL | 1 refills | Status: DC

## 2018-04-05 ENCOUNTER — Encounter: Payer: Self-pay | Admitting: Family Medicine

## 2018-04-05 ENCOUNTER — Ambulatory Visit (INDEPENDENT_AMBULATORY_CARE_PROVIDER_SITE_OTHER): Payer: 59 | Admitting: Family Medicine

## 2018-04-05 DIAGNOSIS — R300 Dysuria: Secondary | ICD-10-CM

## 2018-04-05 NOTE — Progress Notes (Signed)
Patient: Jean Solis MRN: [id number] DOB: 10/17/1995 PCP: Orland Mustard, MD   -patient rescheduled.     I connected with [name] on 04/05/18 at @CHLAPPTIME @ by a video enabled telemedicine application and verified that I am speaking with the correct person using two identifiers.  Location patient: Home Location provider: [facility], Office Persons participating in this virtual visit:   I discussed the limitations of evaluation and management by telemedicine and the availability of in person appointments. The patient expressed understanding and agreed to proceed.   Subjective:  No chief complaint on file.   HPI: The patient is a 23 y.o. female who presents today for   Review of Systems  Allergies Patient has No Known Allergies.  Past Medical History Patient  has a past medical history of Chlamydia (09/2011) and STD (sexually transmitted disease).  Surgical History Patient  has a past surgical history that includes Tonsillectomy and Bartholin cyst marsupialization (Right, 2012).  Family History Pateint's family history includes Diabetes in her maternal uncle; Hyperlipidemia in her father and mother; Hypertension in her father, maternal grandmother, and mother.  Social History Patient  reports that she has never smoked. She has never used smokeless tobacco. She reports current alcohol use of about 1.0 standard drinks of alcohol per week. She reports that she does not use drugs.    Objective: There were no vitals filed for this visit.  There is no height or weight on file to calculate BMI.  Physical Exam     Assessment/plan:      No follow-ups on file.  Records requested if needed. Time spent with patient:  minutes, of which >50% was spent in obtaining information about her symptoms, reviweing her previous labs, evaluations, and treatments, counseling her about her conditions (please see discussed topics above), and developing a plan to further  investigate it; she had a number of questions which I addressed.    Orland Mustard, MD [facility]  04/05/2018

## 2018-04-06 ENCOUNTER — Other Ambulatory Visit: Payer: Self-pay

## 2018-04-06 ENCOUNTER — Ambulatory Visit (INDEPENDENT_AMBULATORY_CARE_PROVIDER_SITE_OTHER): Payer: 59 | Admitting: Family Medicine

## 2018-04-06 ENCOUNTER — Encounter: Payer: Self-pay | Admitting: Family Medicine

## 2018-04-06 ENCOUNTER — Other Ambulatory Visit (HOSPITAL_COMMUNITY)
Admission: RE | Admit: 2018-04-06 | Discharge: 2018-04-06 | Disposition: A | Discharge status: Home / Self Care | Payer: 59 | Source: Ambulatory Visit | Attending: Family Medicine | Admitting: Family Medicine

## 2018-04-06 VITALS — BP 104/70 | HR 92 | Temp 98.2°F | Ht 65.0 in | Wt 214.6 lb

## 2018-04-06 DIAGNOSIS — N898 Other specified noninflammatory disorders of vagina: Secondary | ICD-10-CM | POA: Diagnosis not present

## 2018-04-06 DIAGNOSIS — A749 Chlamydial infection, unspecified: Secondary | ICD-10-CM | POA: Diagnosis not present

## 2018-04-06 DIAGNOSIS — R3915 Urgency of urination: Secondary | ICD-10-CM | POA: Diagnosis not present

## 2018-04-06 LAB — POCT URINALYSIS DIPSTICK
Bilirubin, UA: NEGATIVE
Glucose, UA: NEGATIVE
Ketones, UA: NEGATIVE
LEUKOCYTES UA: NEGATIVE
Nitrite, UA: POSITIVE
Protein, UA: NEGATIVE
Spec Grav, UA: 1.025 (ref 1.010–1.025)
Urobilinogen, UA: 0.2 E.U./dL
pH, UA: 6 (ref 5.0–8.0)

## 2018-04-06 MED ORDER — SULFAMETHOXAZOLE-TRIMETHOPRIM 800-160 MG PO TABS: 1.0000 | ORAL_TABLET | Freq: Two times a day (BID) | ORAL | 0 refills | Status: DC

## 2018-04-06 MED ORDER — FLUCONAZOLE 150 MG PO TABS: 150.0000 mg | ORAL_TABLET | Freq: Once | ORAL | 0 refills | Status: AC

## 2018-04-06 NOTE — Progress Notes (Signed)
Patient: Jean Solis MRN: [id number] DOB: April 08, 1995 PCP: Orland Mustard, MD     Subjective:  Chief Complaint  Patient presents with  . Dysuria  . Exposure to STD    HPI: The patient is a 23 y.o. female who presents today for symptoms of a UTI, vaginal discharge and test of cure for chlamydial infection.   She started to have symptoms of a UTI on Tuesday night. She has no dysuria, but increased urgency and frequency. No blood in her urine. She has a slight pain after she finished urinating. No pain in her bladder and no CVA tenderness.   Vaginal discharge: started after all of the medication she was given for her primary herpes outbreak +chlamydia. She states it mildly itches and is white/gray in color. No pain, not sexually active. Took medication for chlamydia infection before. She doesn't think there is an odor. NO new sexual partners.   She is also going to have a TOC for her chlamydia. She took her medication and told her partner who she is no longer with. Also told him she had herpes which is better.   Review of Systems  Constitutional: Negative for chills and fever.  Genitourinary: Positive for dysuria, frequency, urgency and vaginal discharge. Negative for decreased urine volume, difficulty urinating, flank pain, pelvic pain, vaginal bleeding and vaginal pain.  Musculoskeletal: Negative for back pain.    Allergies Patient has No Known Allergies.  Past Medical History Patient  has a past medical history of Chlamydia (09/2011) and STD (sexually transmitted disease).  Surgical History Patient  has a past surgical history that includes Tonsillectomy and Bartholin cyst marsupialization (Right, 2012).  Family History Pateint's family history includes Diabetes in her maternal uncle; Hyperlipidemia in her father and mother; Hypertension in her father, maternal grandmother, and mother.  Social History Patient  reports that she has never smoked. She has never used smokeless  tobacco. She reports current alcohol use of about 1.0 standard drinks of alcohol per week. She reports that she does not use drugs.    Objective: Vitals:   04/06/18 0823  BP: 104/70  Pulse: 92  Temp: 98.2 F (36.8 C)  TempSrc: Oral  SpO2: 98%  Weight: 214 lb 9.6 oz (97.3 kg)  Height: 5\' 5"  (1.651 m)    Body mass index is 35.71 kg/m.  Physical Exam Vitals signs reviewed.  Constitutional:      Appearance: She is obese.  Neck:     Musculoskeletal: Normal range of motion and neck supple.  Cardiovascular:     Rate and Rhythm: Normal rate and regular rhythm.     Heart sounds: Normal heart sounds.  Pulmonary:     Effort: Pulmonary effort is normal. No respiratory distress.     Breath sounds: Normal breath sounds. No wheezing or rales.  Abdominal:     General: Abdomen is flat. Bowel sounds are normal.     Palpations: Abdomen is soft.     Tenderness: There is no abdominal tenderness.  Genitourinary:    General: Normal vulva.     Vagina: Vaginal discharge (grayish discharge. no odor on exam. ) present.  Skin:    General: Skin is warm.  Neurological:     Mental Status: She is alert.    UA: +nitrite and trace blood.       Assessment/plan: 1. Urinary urgency UA impressive for UTI. Treating with course of bactrim. Will f/u with culture.  - POCT Urinalysis Dipstick - Urine Culture  2. Vaginal discharge BV vs.  Yeast. Vaginal swab pending. Sending in diflucan, but will wait for swab results before treating for BV.   3. Chlamydia infection Took medication. Told partner. No longer sexually active or in relationship with patient. Discussed safe sex and use of condoms. TOC today.  - Cervicovaginal ancillary only( [place])   Return if symptoms worsen or fail to improve.     Orland Mustard, MD [facility]  04/06/2018

## 2018-04-07 ENCOUNTER — Other Ambulatory Visit: Payer: 59

## 2018-04-07 LAB — CERVICOVAGINAL ANCILLARY ONLY
BACTERIAL VAGINITIS: NEGATIVE
CHLAMYDIA, DNA PROBE: NEGATIVE
Candida vaginitis: POSITIVE — AB
Neisseria Gonorrhea: NEGATIVE
Trichomonas: NEGATIVE

## 2018-04-08 LAB — URINE CULTURE
MICRO NUMBER:: 355706
SPECIMEN QUALITY:: ADEQUATE

## 2018-04-10 ENCOUNTER — Other Ambulatory Visit: Payer: Self-pay | Admitting: Family Medicine

## 2018-04-10 MED ORDER — NITROFURANTOIN MONOHYD MACRO 100 MG PO CAPS: 100.0000 mg | ORAL_CAPSULE | Freq: Two times a day (BID) | ORAL | 0 refills | Status: DC

## 2018-06-15 ENCOUNTER — Other Ambulatory Visit: Payer: Self-pay

## 2018-06-15 NOTE — Progress Notes (Deleted)
GYNECOLOGY  VISIT   HPI: 23 y.o.   Single Black or African American Not Hispanic or Latino  female   G1P1001 with No LMP recorded.   here for std testing.  GYNECOLOGIC HISTORY: No LMP recorded. Contraception:IUD Menopausal hormone therapy: none        OB History    Gravida  1   Para  1   Term  1   Preterm  0   AB  0   Living  1     SAB  0   TAB  0   Ectopic  0   Multiple  0   Live Births  1              Patient Active Problem List   Diagnosis Date Noted  . Genital herpes 03/01/2018  . IUD contraception 12/07/2017  . Pre-diabetes 07/20/2017  . Obesity (BMI 30.0-34.9) 04/07/2017  . Vitamin D deficiency 03/28/2017  . Obesity affecting pregnancy, antepartum 03/23/2017  . Eczema 12/27/2011    Past Medical History:  Diagnosis Date  . Chlamydia 09/2011  . STD (sexually transmitted disease)     Past Surgical History:  Procedure Laterality Date  . BARTHOLIN CYST MARSUPIALIZATION Right 2012  . TONSILLECTOMY      Current Outpatient Medications  Medication Sig Dispense Refill  . levonorgestrel (MIRENA) 20 MCG/24HR IUD 1 each by Intrauterine route once.    . lidocaine (XYLOCAINE) 5 % ointment Apply 1 application topically 4 (four) times daily as needed. 30 g 0  . nitrofurantoin, macrocrystal-monohydrate, (MACROBID) 100 MG capsule Take 1 capsule (100 mg total) by mouth 2 (two) times daily. 14 capsule 0  . valACYclovir (VALTREX) 500 MG tablet Take 500 mg by mouth 2 (two) times daily.     No current facility-administered medications for this visit.      ALLERGIES: Patient has no known allergies.  Family History  Problem Relation Age of Onset  . Hypertension Mother   . Hyperlipidemia Mother   . Hyperlipidemia Father   . Hypertension Father   . Hypertension Maternal Grandmother   . Diabetes Maternal Uncle     Social History   Socioeconomic History  . Marital status: Single    Spouse name: Not on file  . Number of children: 0  . Years of  education: 2513  . Highest education level: Not on file  Occupational History    Employer: [employer]  Social Needs  . Financial resource strain: Not on file  . Food insecurity:    Worry: Not on file    Inability: Not on file  . Transportation needs:    Medical: Not on file    Non-medical: Not on file  Tobacco Use  . Smoking status: Never Smoker  . Smokeless tobacco: Never Used  Substance and Sexual Activity  . Alcohol use: Yes    Alcohol/week: 1.0 standard drinks    Types: 1 Standard drinks or equivalent per week    Frequency: Never  . Drug use: No  . Sexual activity: Yes    Birth control/protection: I.U.D.  Lifestyle  . Physical activity:    Days per week: Not on file    Minutes per session: Not on file  . Stress: Not on file  Relationships  . Social connections:    Talks on phone: Not on file    Gets together: Not on file    Attends religious service: Not on file    Active member of club or organization: Not on file  Attends meetings of clubs or organizations: Not on file    Relationship status: Not on file  . Intimate partner violence:    Fear of current or ex partner: Not on file    Emotionally abused: Not on file    Physically abused: Not on file    Forced sexual activity: Not on file  Other Topics Concern  . Not on file  Social History Narrative  . Not on file    ROS  PHYSICAL EXAMINATION:    There were no vitals taken for this visit.    General appearance: alert, cooperative and appears stated age Neck: no adenopathy, supple, symmetrical, trachea midline and thyroid {CHL AMB PHY EX THYROID NORM DEFAULT:[phone]::"normal to inspection and palpation"} Breasts: {Exam; breast:13139::"normal appearance, no masses or tenderness"} Abdomen: soft, non-tender; non distended, no masses,  no organomegaly  Pelvic: External genitalia:  no lesions              Urethra:  normal appearing urethra with no masses, tenderness or lesions              Bartholins  and Skenes: normal                 Vagina: normal appearing vagina with normal color and discharge, no lesions              Cervix: {CHL AMB PHY EX CERVIX NORM DEFAULT:[phone]::"no lesions"}              Bimanual Exam:  Uterus:  {CHL AMB PHY EX UTERUS NORM DEFAULT:[phone]::"normal size, contour, position, consistency, mobility, non-tender"}              Adnexa: {CHL AMB PHY EX ADNEXA NO MASS [phone]::"no mass, fullness, tenderness"}              Rectovaginal: {yes no:314532}.  Confirms.              Anus:  normal sphincter tone, no lesions  Chaperone was present for exam.  ASSESSMENT     PLAN    An After Visit Summary was printed and given to the patient.  *** minutes face to face time of which over 50% was spent in counseling.

## 2018-06-16 ENCOUNTER — Ambulatory Visit: Payer: 59 | Admitting: Obstetrics and Gynecology

## 2018-07-11 ENCOUNTER — Other Ambulatory Visit: Payer: Self-pay

## 2018-07-12 ENCOUNTER — Ambulatory Visit (INDEPENDENT_AMBULATORY_CARE_PROVIDER_SITE_OTHER): Payer: 59 | Admitting: Obstetrics and Gynecology

## 2018-07-12 ENCOUNTER — Encounter: Payer: Self-pay | Admitting: Obstetrics and Gynecology

## 2018-07-12 VITALS — BP 120/70 | HR 76 | Temp 98.3°F | Wt 211.6 lb

## 2018-07-12 DIAGNOSIS — Z113 Encounter for screening for infections with a predominantly sexual mode of transmission: Secondary | ICD-10-CM

## 2018-07-12 DIAGNOSIS — A6000 Herpesviral infection of urogenital system, unspecified: Secondary | ICD-10-CM

## 2018-07-12 DIAGNOSIS — N760 Acute vaginitis: Secondary | ICD-10-CM

## 2018-07-12 MED ORDER — VALACYCLOVIR HCL 1 G PO TABS: ORAL_TABLET | ORAL | 4 refills | Status: DC

## 2018-07-12 NOTE — Progress Notes (Signed)
GYNECOLOGY  VISIT   HPI: 23 y.o.   Single Black or African American Not Hispanic or Latino  female   [id number] with Patient's last menstrual period was 06/28/2018 (approximate).   here for STD testing.   Reports vaginal irritation, vaginal itching, and vaginal discharge that began 1 week ago. 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 She has been getting outbreaks frequently. She has been on daily suppression for ~6 weeks, still getting outbreaks. She was given the suppression by the health department, not sure if she is on 500-1,000 mg a day. She has been seen at the health department 2 x in the last few months. One time had BV and yeast, other time just yeast. Current symptoms started a week ago. She c/o a heavy, thick white vaginal d/c, slight itching, no odor. Feels irritated externally on her labia and near her anus.  She noticed a sore on her throat. Feels slight irritation, not painful. No fever, no cough.  She is with the same partner, using condoms. Wants STD testing.   GYNECOLOGIC HISTORY: Patient's last menstrual period was 06/28/2018 (approximate). Contraception: IUD Menopausal hormone therapy: None        OB History    Gravida  1   Para  1   Term  1   Preterm  0   AB  0   Living  1     SAB  0   TAB  0   Ectopic  0   Multiple  0   Live Births  1              Patient Active Problem List   Diagnosis Date Noted  . Genital herpes 03/01/2018  . IUD contraception 12/07/2017  . Pre-diabetes 07/20/2017  . Obesity (BMI 30.0-34.9) 04/07/2017  . Vitamin D deficiency 03/28/2017  . Obesity affecting pregnancy, antepartum 03/23/2017  . Eczema 12/27/2011    Past Medical History:  Diagnosis Date  . Chlamydia 09/2011  . STD (sexually transmitted disease)     Past Surgical History:  Procedure Laterality Date  . BARTHOLIN CYST MARSUPIALIZATION Right 2012  . TONSILLECTOMY      Current Outpatient Medications  Medication Sig Dispense Refill   . levonorgestrel (MIRENA) 20 MCG/24HR IUD 1 each by Intrauterine route once.    . valACYclovir (VALTREX) 500 MG tablet Take 500 mg by mouth 2 (two) times daily.     No current facility-administered medications for this visit.      ALLERGIES: Patient has no known allergies.  Family History  Problem Relation Age of Onset  . Hypertension Mother   . Hyperlipidemia Mother   . Hyperlipidemia Father   . Hypertension Father   . Hypertension Maternal Grandmother   . Diabetes Maternal Uncle     Social History   Socioeconomic History  . Marital status: Single    Spouse name: Not on file  . Number of children: 0  . Years of education: 77  . Highest education level: Not on file  Occupational History    Employer: [employer]  Social Needs  . Financial resource strain: Not on file  . Food insecurity    Worry: Not on file    Inability: Not on file  . Transportation needs    Medical: Not on file    Non-medical: Not on file  Tobacco Use  . Smoking status: Never Smoker  . Smokeless tobacco: Never Used  Substance and Sexual Activity  . Alcohol use: Yes  Alcohol/week: 1.0 standard drinks    Types: 1 Standard drinks or equivalent per week    Frequency: Never  . Drug use: No  . Sexual activity: Yes    Birth control/protection: I.U.D.  Lifestyle  . Physical activity    Days per week: Not on file    Minutes per session: Not on file  . Stress: Not on file  Relationships  . Social Musicianconnections    Talks on phone: Not on file    Gets together: Not on file    Attends religious service: Not on file    Active member of club or organization: Not on file    Attends meetings of clubs or organizations: Not on file    Relationship status: Not on file  . Intimate partner violence    Fear of current or ex partner: Not on file    Emotionally abused: Not on file    Physically abused: Not on file    Forced sexual activity: Not on file  Other Topics Concern  . Not on file  Social  History Narrative  . Not on file    Review of Systems  Constitutional: Negative.   HENT: Negative.   Eyes: Negative.   Respiratory: Negative.   Cardiovascular: Negative.   Gastrointestinal: Negative.   Genitourinary:       Vaginal discharge Vaginal irritation Vaginal itching  Musculoskeletal: Negative.   Skin: Negative.   Neurological: Negative.   Endo/Heme/Allergies: Negative.   Psychiatric/Behavioral: Negative.     PHYSICAL EXAMINATION:    BP 120/70 (BP Location: Right Arm, Patient Position: Sitting, Cuff Size: Large)   Pulse 76   Temp 98.3 F (36.8 C) (Skin)   Wt 211 lb 9.6 oz (96 kg)   LMP 06/28/2018 (Approximate)   BMI 35.21 kg/m     General appearance: alert, cooperative and appears stated age  Pelvic: External genitalia: small ulcer on the lateral right vulva, small healing ulcer on her left buttock              Urethra:  normal appearing urethra with no masses, tenderness or lesions              Bartholins and Skenes: normal                 Vagina: normal appearing vagina with an increase in white creamy vaginal discharge              Cervix: no lesions and IUD string 3 cm               Chaperone was present for exam.  Wet prep: ? clue, no trich, ++ wbc KOH: no yeast PH: 4.5   ASSESSMENT H/O HSV, c/o recurrent infections, on suppression Vaginitis STD testing    PLAN Affirm STD testing She is taking the valtrex 1 gm daily, will refill, she can take 1 tablet bid with outbreak x 3 days.   An After Visit Summary was printed and given to the patient.  ~25 minutes face to face time of which over 50% was spent in counseling.   Addendum: the patient asked me to look at her throat, slight irritation, worried about hsv in her throat. Her right tonsil appears to have some pus on it. Mild erythema. Advised to f/u with her primary.

## 2018-07-13 ENCOUNTER — Ambulatory Visit (INDEPENDENT_AMBULATORY_CARE_PROVIDER_SITE_OTHER): Payer: 59 | Admitting: Family Medicine

## 2018-07-13 ENCOUNTER — Other Ambulatory Visit: Payer: 59

## 2018-07-13 ENCOUNTER — Encounter: Payer: Self-pay | Admitting: Family Medicine

## 2018-07-13 ENCOUNTER — Telehealth: Payer: Self-pay | Admitting: *Deleted

## 2018-07-13 ENCOUNTER — Telehealth: Payer: Self-pay

## 2018-07-13 DIAGNOSIS — Z20822 Contact with and (suspected) exposure to covid-19: Secondary | ICD-10-CM

## 2018-07-13 DIAGNOSIS — Z20828 Contact with and (suspected) exposure to other viral communicable diseases: Secondary | ICD-10-CM | POA: Diagnosis not present

## 2018-07-13 DIAGNOSIS — R05 Cough: Secondary | ICD-10-CM | POA: Diagnosis not present

## 2018-07-13 DIAGNOSIS — J029 Acute pharyngitis, unspecified: Secondary | ICD-10-CM | POA: Diagnosis not present

## 2018-07-13 DIAGNOSIS — R059 Cough, unspecified: Secondary | ICD-10-CM

## 2018-07-13 LAB — VAGINITIS/VAGINOSIS, DNA PROBE
Candida Species: NEGATIVE
Gardnerella vaginalis: POSITIVE — AB
Trichomonas vaginosis: NEGATIVE

## 2018-07-13 MED ORDER — AMOXICILLIN 875 MG PO TABS: 875.0000 mg | ORAL_TABLET | Freq: Two times a day (BID) | ORAL | 0 refills | Status: DC

## 2018-07-13 MED ORDER — METRONIDAZOLE 0.75 % VA GEL: 1.0000 | Freq: Every day | VAGINAL | 0 refills | Status: DC

## 2018-07-13 NOTE — Progress Notes (Signed)
    Chief Complaint:  Jean Solis is a 23 y.o. female who presents today for a virtual office visit with a chief complaint of sore throat.   Assessment/Plan:  Sore throat/cough Concern for COVID-19 infection however has significant degree of tonsillar hypertrophy and erythema as well.  We will send for testing for COVID-19.  We will also empirically start treatment for strep pharyngitis until results return.  Encouraged good oral hydration.  Can use over-the-counter analgesics as needed.  Discussed reasons to return to care and seek emergent care.    Subjective:  HPI:  Sore Throat Started about 4 days ago. Worsened over that past day.  Associated with slight headaches and some chills.  No fevers.  No cough or shortness of breath.  Tried NyQuil with no improvement.  No known sick contacts.  No other obvious alleviating or aggravating factors.  No other treatments tried.  ROS: Per HPI  PMH: She reports that she has never smoked. She has never used smokeless tobacco. She reports current alcohol use of about 1.0 standard drinks of alcohol per week. She reports that she does not use drugs.      Objective/Observations  Physical Exam: Gen: NAD, resting comfortably HEENT: Oropharynx erythematous with tonsillar hypertrophy noted. Pulm: Normal work of breathing Neuro: Grossly normal, moves all extremities Psych: Normal affect and thought content   Virtual Visit via Video   I connected with Jean Solis on 07/13/18 at  4:20 PM EDT by a video enabled telemedicine application and verified that I am speaking with the correct person using two identifiers. I discussed the limitations of evaluation and management by telemedicine and the availability of in person appointments. The patient expressed understanding and agreed to proceed.   Patient location: Home Provider location: [place] participating in the virtual visit: Myself and Patient     Algis Greenhouse. Jerline Pain,  MD 07/13/2018 8:51 AM

## 2018-07-13 NOTE — Telephone Encounter (Signed)
-----   Message from Loralyn Freshwater, [place] sent at 07/13/2018  9:20 AM EDT ----- Regarding: Covid Testing  MRN: [id number] DOB 1995-10-12  [facility] ID [id number]

## 2018-07-13 NOTE — Telephone Encounter (Signed)
Spoke with patient, scheduled her for appointment for COVID 19 test today at 1:15 pm at [facility].  Testing protocol reviewed.

## 2018-07-13 NOTE — Telephone Encounter (Signed)
-----   Message from Salvadore Dom, MD sent at 07/13/2018 12:27 PM EDT ----- Please inform the patient that her vaginitis probe was + for BV and treat with flagyl (either oral or vaginal, her choice), no ETOH while on Flagyl.  Oral: Flagyl 500 mg BID x 7 days, or Vaginal: Metrogel, 1 applicator per vagina q day x 5 days.

## 2018-07-13 NOTE — Telephone Encounter (Signed)
Spoke with patient. Results given. Patient verbalizes understanding. Rx for Metrogel place 1 applicator vaginally x 5 nights sent to pharmacy on file. Patient is agreeable. Encounter closed.

## 2018-07-15 LAB — CHLAMYDIA/GONOCOCCUS/TRICHOMONAS, NAA
Chlamydia by NAA: NEGATIVE
Gonococcus by NAA: NEGATIVE
Trich vag by NAA: NEGATIVE

## 2018-07-18 LAB — HEP, RPR, HIV PANEL
HIV Screen 4th Generation wRfx: NONREACTIVE
Hepatitis B Surface Ag: NEGATIVE
RPR Ser Ql: NONREACTIVE

## 2018-07-18 LAB — HEPATITIS C ANTIBODY: Hep C Virus Ab: <0.1 s/co ratio (ref 0.0–0.9)

## 2018-07-19 LAB — NOVEL CORONAVIRUS, NAA: SARS-CoV-2, NAA: NOT DETECTED

## 2018-10-06 IMAGING — US US OB TRANSVAGINAL
1 series · 15 of 28 positions shown · non-contrast
Comparison: 02/08/2017

CLINICAL DATA: Follow-up viability.  Vaginal spotting

EXAM:
TRANSVAGINAL OB ULTRASOUND
TECHNIQUE: Transvaginal ultrasound was performed for complete evaluation of the
gestation as well as the maternal uterus, adnexal regions, and
pelvic cul-de-sac.

[Series 1: us ob transvaginal · 15 of 59 slices shown]
[im 1/59]
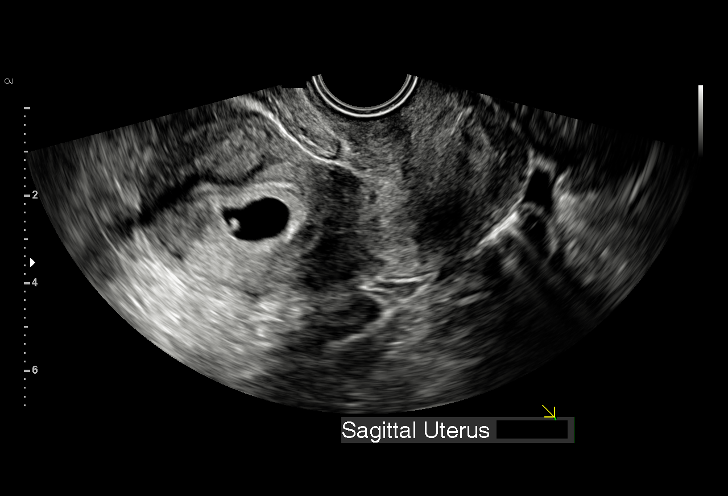
[im 5/59]
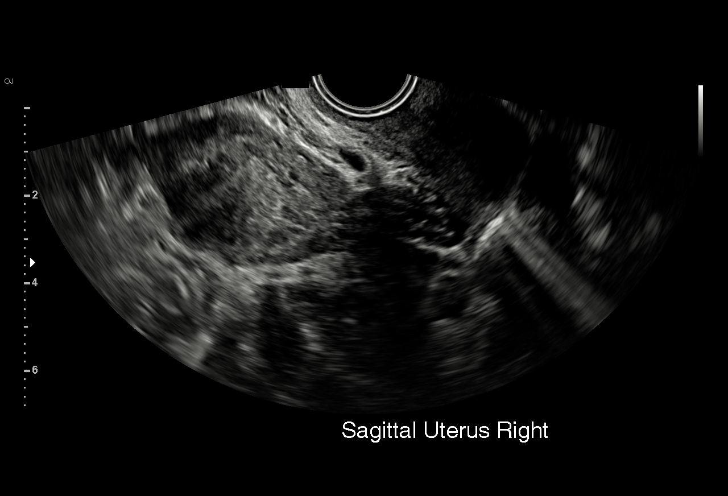
[im 9/59]
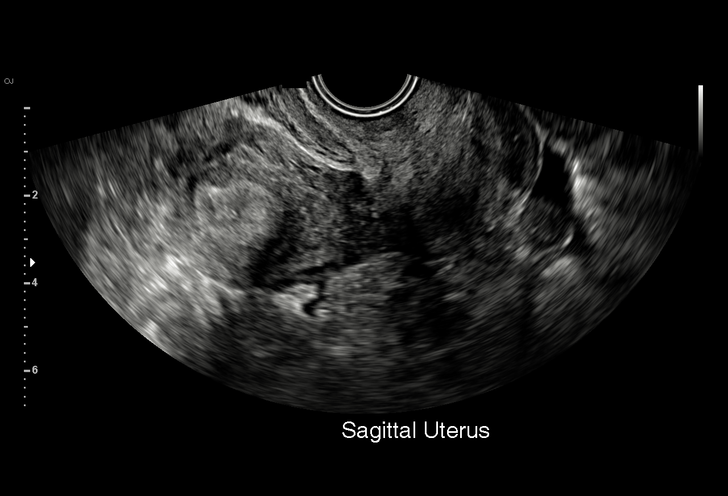
[im 13/59]
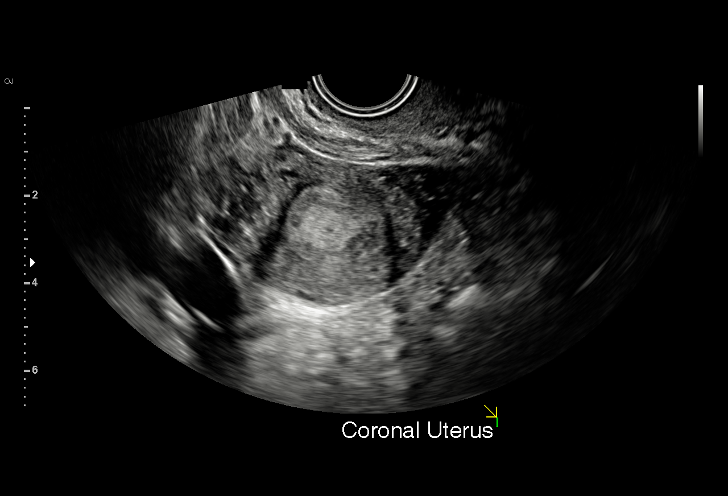
[im 18/59]
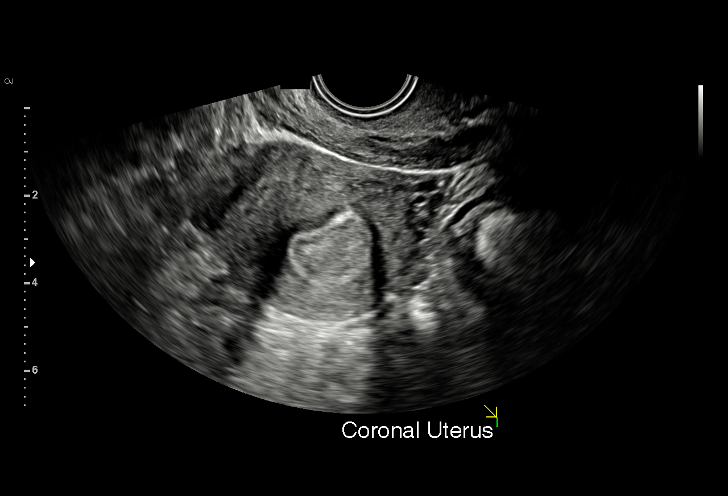
[im 22/59]
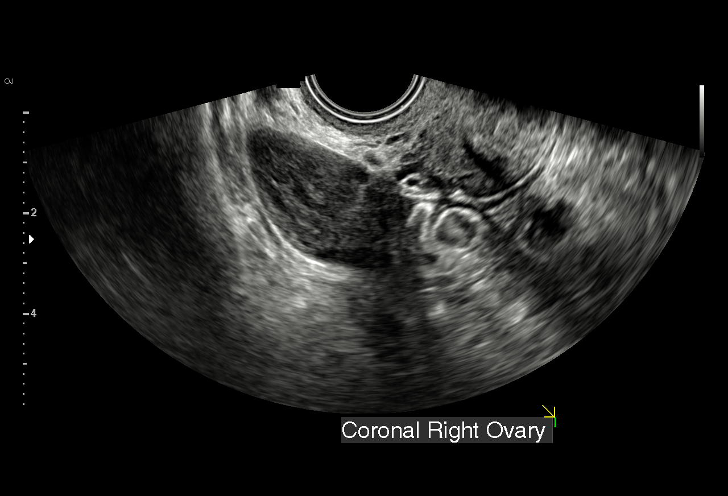
[im 26/59]
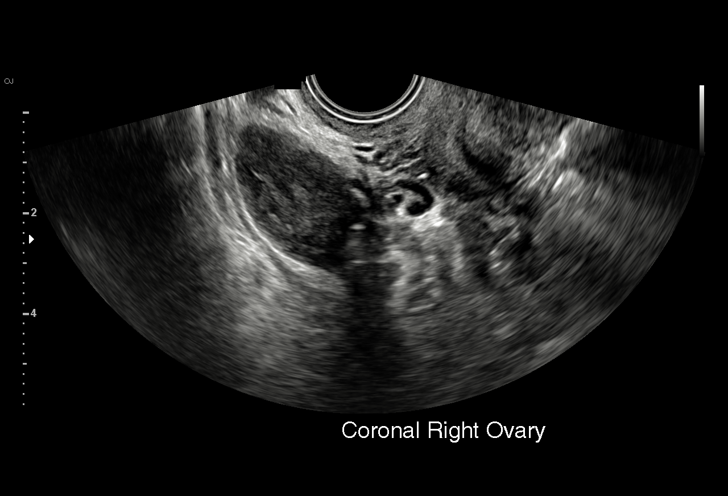
[im 31/59]
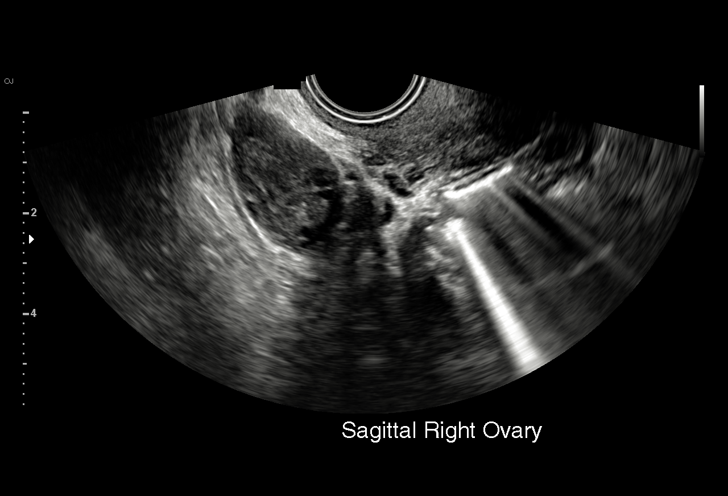
[im 33/59]
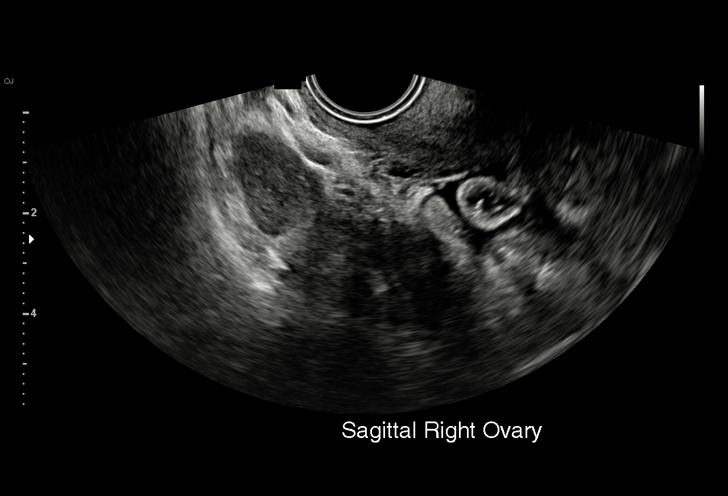
[im 37/59]
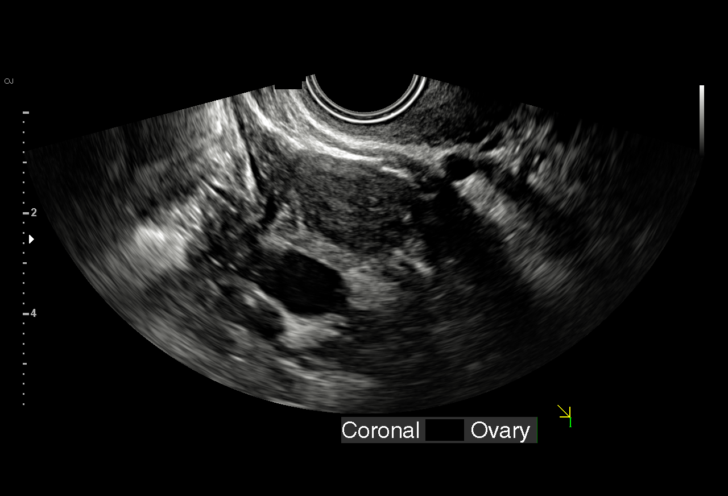
[im 41/59]
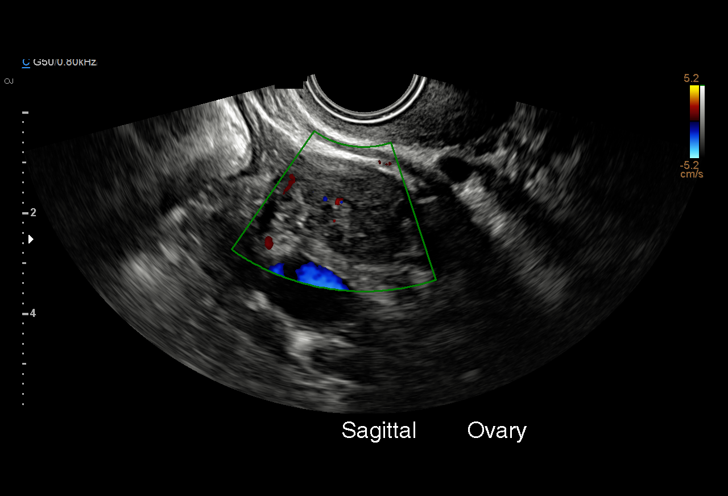
[im 46/59]
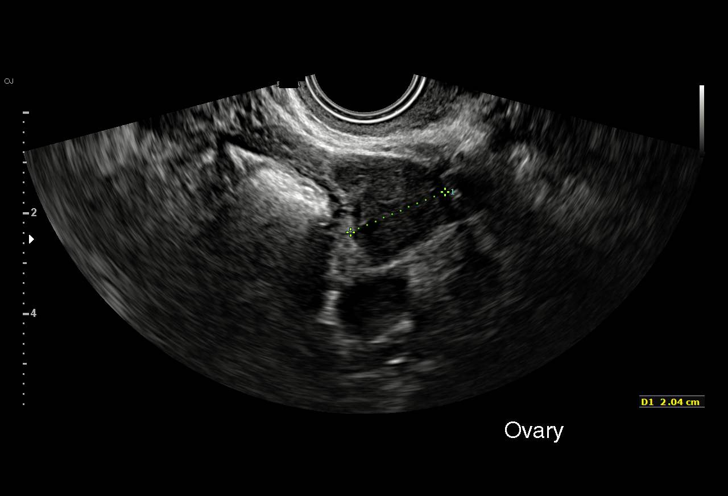
[im 50/59]
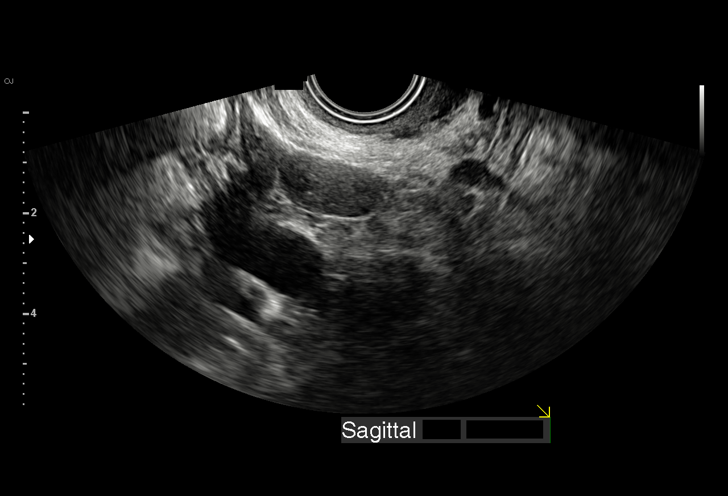
[im 54/59]
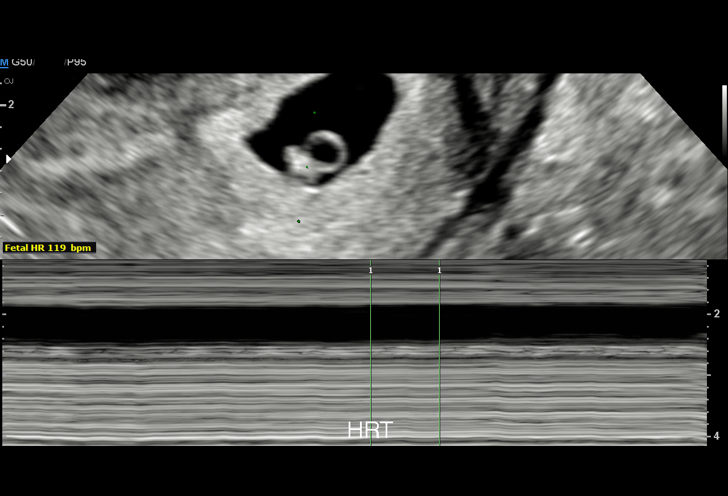
[im 59/59]
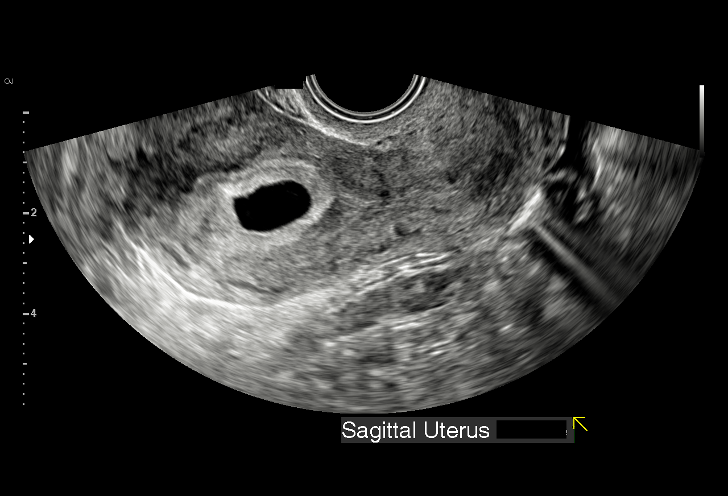

[15 of 28 positions shown; findings below may reference images not displayed]

FINDINGS: Intrauterine gestational sac: Single

Yolk sac:  Visualized

Embryo:  Visualized

Cardiac Activity: Visualized

Heart Rate: 119 bpm

MSD:   mm    w     d

CRL:   4 mm   6 w 1 d                  US EDC: 10/13/2017

Subchorionic hemorrhage:  None visualized.

Maternal uterus/adnexae: No adnexal mass or free fluid.
IMPRESSION: Six week 1 day intrauterine pregnancy. Fetal heart rate 119 beats
per minute. No acute maternal findings.

## 2018-10-18 ENCOUNTER — Other Ambulatory Visit: Payer: Self-pay

## 2018-10-18 DIAGNOSIS — Z20822 Contact with and (suspected) exposure to covid-19: Secondary | ICD-10-CM

## 2018-10-20 LAB — NOVEL CORONAVIRUS, NAA: SARS-CoV-2, NAA: NOT DETECTED

## 2019-02-09 ENCOUNTER — Ambulatory Visit: Payer: 59 | Attending: Internal Medicine

## 2019-02-09 DIAGNOSIS — Z20822 Contact with and (suspected) exposure to covid-19: Secondary | ICD-10-CM

## 2019-02-10 LAB — NOVEL CORONAVIRUS, NAA: SARS-CoV-2, NAA: NOT DETECTED

## 2019-02-14 ENCOUNTER — Telehealth: Payer: Self-pay | Admitting: Obstetrics and Gynecology

## 2019-02-14 DIAGNOSIS — Z30432 Encounter for removal of intrauterine contraceptive device: Secondary | ICD-10-CM

## 2019-02-14 NOTE — Telephone Encounter (Signed)
Patient would like to schedule appointment for mirena removal.

## 2019-02-14 NOTE — Progress Notes (Deleted)
24 y.o. G54P1001 Single Black or African American Not Hispanic or Latino female here for annual exam.      No LMP recorded.          Sexually active: {yes no:314532}  The current method of family planning is {contraception:315051}.    Exercising: {yes no:314532}  {types:19826} Smoker:  {YES [id number]  Health Maintenance: Pap:  03/23/17 normal  History of abnormal Pap:  no TDaP:  08/10/17 Gardasil: patient unsure.    reports that she has never smoked. She has never used smokeless tobacco. She reports current alcohol use of about 1.0 standard drinks of alcohol per week. She reports that she does not use drugs.  Past Medical History:  Diagnosis Date  . Chlamydia 09/2011  . STD (sexually transmitted disease)     Past Surgical History:  Procedure Laterality Date  . BARTHOLIN CYST MARSUPIALIZATION Right 2012  . TONSILLECTOMY      Current Outpatient Medications  Medication Sig Dispense Refill  . amoxicillin (AMOXIL) 875 MG tablet Take 1 tablet (875 mg total) by mouth 2 (two) times daily. 14 tablet 0  . levonorgestrel (MIRENA) 20 MCG/24HR IUD 1 each by Intrauterine route once.    . metroNIDAZOLE (METROGEL) 0.75 % vaginal gel Place 1 Applicatorful vaginally at bedtime. X 5 nights 70 g 0  . valACYclovir (VALTREX) 1000 MG tablet Take one tablet a day, with an outbreak increase to one tablet bid x 3 days. 90 tablet 4   No current facility-administered medications for this visit.    Family History  Problem Relation Age of Onset  . Hypertension Mother   . Hyperlipidemia Mother   . Hyperlipidemia Father   . Hypertension Father   . Hypertension Maternal Grandmother   . Diabetes Maternal Uncle     Review of Systems  Exam:   There were no vitals taken for this visit.  Weight change: @WEIGHTCHANGE @ Height:      Ht Readings from Last 3 Encounters:  04/06/18 5\' 5"  (1.651 m)  03/01/18 5\' 5"  (1.651 m)  02/22/18 5\' 5"  (1.651 m)    General appearance: alert, cooperative and appears  stated age Head: Normocephalic, without obvious abnormality, atraumatic Neck: no adenopathy, supple, symmetrical, trachea midline and thyroid {CHL AMB PHY EX THYROID NORM [phone]::"normal to inspection and palpation"} Lungs: clear to auscultation bilaterally Cardiovascular: regular rate and rhythm Breasts: {Exam; breast:13139::"normal appearance, no masses or tenderness"} Abdomen: soft, non-tender; non distended,  no masses,  no organomegaly Extremities: extremities normal, atraumatic, no cyanosis or edema Skin: Skin color, texture, turgor normal. No rashes or lesions Lymph nodes: Cervical, supraclavicular, and axillary nodes normal. No abnormal inguinal nodes palpated Neurologic: Grossly normal   Pelvic: External genitalia:  no lesions              Urethra:  normal appearing urethra with no masses, tenderness or lesions              Bartholins and Skenes: normal                 Vagina: normal appearing vagina with normal color and discharge, no lesions              Cervix: {CHL AMB PHY EX CERVIX NORM DEFAULT:[phone]::"no lesions"}               Bimanual Exam:  Uterus:  {CHL AMB PHY EX UTERUS NORM [phone]::"normal size, contour, position, consistency, mobility, non-tender"}              Adnexa: {CHL  AMB PHY EX ADNEXA NO MASS [phone]::"no mass, fullness, tenderness"}               Rectovaginal: Confirms               Anus:  normal sphincter tone, no lesions  *** chaperoned for the exam.  A:  Well Woman with normal exam  P:

## 2019-02-14 NOTE — Telephone Encounter (Signed)
Call placed to convey benefits for iud removal. Spoke with the patient and conveyed the benefits. Patient understands/agreeable with the benefits. Patient aware of the cancellation policy. Appointment scheduled 02/419

## 2019-02-14 NOTE — Telephone Encounter (Signed)
Patient is due AEX, reviewed with Dr. Oscar La, ok for IUD removal and AEX same day.   Spoke with patient. Patient is requesting to schedule IUD removal. Denies any current concerns, would like IUD removed. Patient was seen for OV 03/07/18, due for AEX. Advised per Dr. Oscar La. Patient request to proceed with scheduling. AEX with IUD removal scheduled for 02/15/19 at 2pm with Dr. Oscar La. [id number] prescreen negative, precautions reviewed.   Order placed for IUD removal for precert.   Routing to provider for final review. Patient is agreeable to disposition. Will close encounter.   Cc: Soundra Pilon, [address] [place]

## 2019-02-15 ENCOUNTER — Ambulatory Visit: Payer: 59 | Admitting: Obstetrics and Gynecology

## 2019-02-19 ENCOUNTER — Encounter: Payer: Self-pay | Admitting: Obstetrics and Gynecology

## 2019-02-19 ENCOUNTER — Other Ambulatory Visit: Payer: Self-pay

## 2019-02-19 ENCOUNTER — Ambulatory Visit (INDEPENDENT_AMBULATORY_CARE_PROVIDER_SITE_OTHER): Payer: 59 | Admitting: Obstetrics and Gynecology

## 2019-02-19 VITALS — BP 130/62 | HR 100 | Temp 98.2°F | Ht 65.5 in | Wt 214.0 lb

## 2019-02-19 DIAGNOSIS — B373 Candidiasis of vulva and vagina: Secondary | ICD-10-CM

## 2019-02-19 DIAGNOSIS — N760 Acute vaginitis: Secondary | ICD-10-CM

## 2019-02-19 DIAGNOSIS — Z0000 Encounter for general adult medical examination without abnormal findings: Secondary | ICD-10-CM

## 2019-02-19 DIAGNOSIS — N898 Other specified noninflammatory disorders of vagina: Secondary | ICD-10-CM

## 2019-02-19 DIAGNOSIS — Z30432 Encounter for removal of intrauterine contraceptive device: Secondary | ICD-10-CM | POA: Diagnosis not present

## 2019-02-19 DIAGNOSIS — E559 Vitamin D deficiency, unspecified: Secondary | ICD-10-CM

## 2019-02-19 DIAGNOSIS — Z6835 Body mass index (BMI) 35.0-35.9, adult: Secondary | ICD-10-CM

## 2019-02-19 DIAGNOSIS — Z01419 Encounter for gynecological examination (general) (routine) without abnormal findings: Secondary | ICD-10-CM

## 2019-02-19 DIAGNOSIS — B3731 Acute candidiasis of vulva and vagina: Secondary | ICD-10-CM

## 2019-02-19 DIAGNOSIS — Z113 Encounter for screening for infections with a predominantly sexual mode of transmission: Secondary | ICD-10-CM | POA: Diagnosis not present

## 2019-02-19 DIAGNOSIS — B9689 Other specified bacterial agents as the cause of diseases classified elsewhere: Secondary | ICD-10-CM | POA: Diagnosis not present

## 2019-02-19 MED ORDER — METRONIDAZOLE 500 MG PO TABS: 500.0000 mg | ORAL_TABLET | Freq: Two times a day (BID) | ORAL | 0 refills | Status: DC

## 2019-02-19 MED ORDER — VALACYCLOVIR HCL 500 MG PO TABS: ORAL_TABLET | ORAL | 4 refills | Status: DC

## 2019-02-19 MED ORDER — FLUCONAZOLE 150 MG PO TABS: 150.0000 mg | ORAL_TABLET | Freq: Once | ORAL | 0 refills | Status: AC

## 2019-02-19 NOTE — Progress Notes (Signed)
24 y.o. G77P1001 Single Black or African American Not Hispanic or Latino female here for annual exam and IUD removal.  Wants to get pregnant. Sexually active, same partner x ~1 year. No dyspareunia. Not using condoms.   Patient states that she is having some white vaginal discharge. The d/c is thin, no odor, no itching,burning or irritation. The d/c has been present for 2 weeks.   Period Cycle (Days): 30 Period Duration (Days): 7 Period Pattern: Regular Menstrual Flow: Moderate Menstrual Control: Tampon Menstrual Control Change Freq (Hours): 4 hours Dysmenorrhea: None   H/O HSV, diagnosed last year. On daily Valtrex.   Patient's last menstrual period was 02/06/2019 (approximate).          Sexually active: Yes.    The current method of family planning is IUD.    Exercising: No.  The patient does not participate in regular exercise at present. Smoker:  no  Health Maintenance: Pap:  03/23/2017 WNL History of abnormal Pap:  no TDaP:  08/10/17 Gardasil: unsure, she will check.    reports that she has never smoked. She has never used smokeless tobacco. She reports current alcohol use of about 1.0 standard drinks of alcohol per week. She reports that she does not use drugs. She has a 52 month old baby girl. 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   Past Medical History:  Diagnosis Date  . Chlamydia 09/2011  . STD (sexually transmitted disease)     Past Surgical History:  Procedure Laterality Date  . BARTHOLIN CYST MARSUPIALIZATION Right 2012  . TONSILLECTOMY      Current Outpatient Medications  Medication Sig Dispense Refill  . levonorgestrel (MIRENA) 20 MCG/24HR IUD 1 each by Intrauterine route once.    . valACYclovir (VALTREX) 1000 MG tablet Take one tablet a day, with an outbreak increase to one tablet bid x 3 days. 90 tablet 4   No current facility-administered medications for this visit.    Family History  Problem Relation Age of Onset  . Hypertension Mother   .  Hyperlipidemia Mother   . Hyperlipidemia Father   . Hypertension Father   . Hypertension Maternal Grandmother   . Diabetes Maternal Uncle     Review of Systems  Constitutional: Negative.   HENT: Negative.   Eyes: Negative.   Respiratory: Negative.   Cardiovascular: Negative.   Gastrointestinal: Negative.   Endocrine: Negative.   Genitourinary: Positive for vaginal discharge.  Musculoskeletal: Negative.   Skin: Negative.   Allergic/Immunologic: Negative.   Neurological: Negative.   Hematological: Negative.   Psychiatric/Behavioral: Negative.     Exam:   BP 130/62   Pulse 100   Temp 98.2 F (36.8 C)   Ht 5' 5.5" (1.664 m)   Wt 214 lb (97.1 kg)   LMP 02/06/2019 (Approximate)   SpO2 97%   BMI 35.07 kg/m   Weight change: @WEIGHTCHANGE @ Height:   Height: 5' 5.5" (166.4 cm)  Ht Readings from Last 3 Encounters:  02/19/19 5' 5.5" (1.664 m)  04/06/18 5\' 5"  (1.651 m)  03/01/18 5\' 5"  (1.651 m)    General appearance: alert, cooperative and appears stated age Head: Normocephalic, without obvious abnormality, atraumatic Neck: no adenopathy, supple, symmetrical, trachea midline and thyroid normal to inspection and palpation Lungs: clear to auscultation bilaterally Cardiovascular: regular rate and rhythm Breasts: normal appearance, no masses or tenderness Abdomen: soft, non-tender; non distended,  no masses,  no organomegaly Extremities: extremities normal, atraumatic, no cyanosis or edema Skin: Skin color, texture, turgor normal. No rashes or  lesions Lymph nodes: Cervical, supraclavicular, and axillary nodes normal. No abnormal inguinal nodes palpated Neurologic: Grossly normal   Pelvic: External genitalia:  no lesions              Urethra:  normal appearing urethra with no masses, tenderness or lesions              Bartholins and Skenes: normal                 Vagina: normal appearing vagina with normal color and discharge, no lesions              Cervix: no cervical  motion tenderness, no lesions and IUD string 3 cm. IUD removed with ringed forceps.                Bimanual Exam:  Uterus:  normal size, contour, position, consistency, mobility, non-tender and anteverted              Adnexa: no mass, fullness, tenderness               Rectovaginal: Confirms               Anus:  normal sphincter tone, no lesions  Gae Dry chaperoned for the exam.  Wet prep: + clue, no trich, + wbc KOH: rare yeast PH: 4.5   A:  Well Woman with normal exam  Contraception, desires IUD removal  H/O vit d def  H/O HSV, on valtrex  Vaginal discharge, BV and yeast on vaginal slides    P:   No pap this year  Screening labs  Screening STD  Vit d  Discussed breast self exam  Discussed calcium and vit D intake  Start PNV  Discussed good health prior to pregnancy  Continue valtrex, decrease to 500 mg daily, BID x 3 days with an outbreak  Treat yeast with diflucan  Treat BV with flagyl (no ETOH)

## 2019-02-19 NOTE — Patient Instructions (Addendum)
Check if you have had your HPV (Gardasil) vaccinations.   EXERCISE AND DIET:  We recommended that you start or continue a regular exercise program for good health. Regular exercise means any activity that makes your heart beat faster and makes you sweat.  We recommend exercising at least 30 minutes per day at least 3 days a week, preferably 4 or 5.  We also recommend a diet low in fat and sugar.  Inactivity, poor dietary choices and obesity can cause diabetes, heart attack, stroke, and kidney damage, among others.    ALCOHOL AND SMOKING:  Women should limit their alcohol intake to no more than 7 drinks/beers/glasses of wine (combined, not each!) per week. Moderation of alcohol intake to this level decreases your risk of breast cancer and liver damage. And of course, no recreational drugs are part of a healthy lifestyle.  And absolutely no smoking or even second hand smoke. Most people know smoking can cause heart and lung diseases, but did you know it also contributes to weakening of your bones? Aging of your skin?  Yellowing of your teeth and nails?  CALCIUM AND VITAMIN D:  Adequate intake of calcium and Vitamin D are recommended.  The recommendations for exact amounts of these supplements seem to change often, but generally speaking 1,000 mg of calcium (between diet and supplement) and 800 units of Vitamin D per day seems prudent. Certain women may benefit from higher intake of Vitamin D.  If you are among these women, your doctor will have told you during your visit.    PAP SMEARS:  Pap smears, to check for cervical cancer or precancers,  have traditionally been done yearly, although recent scientific advances have shown that most women can have pap smears less often.  However, every woman still should have a physical exam from her gynecologist every year. It will include a breast check, inspection of the vulva and vagina to check for abnormal growths or skin changes, a visual exam of the cervix, and then  an exam to evaluate the size and shape of the uterus and ovaries.  And after 26 years of age, a rectal exam is indicated to check for rectal cancers. We will also provide age appropriate advice regarding health maintenance, like when you should have certain vaccines, screening for sexually transmitted diseases, bone density testing, colonoscopy, mammograms, etc.   MAMMOGRAMS:  All women over 34 years old should have a yearly mammogram. Many facilities now offer a "3D" mammogram, which may cost around $50 extra out of pocket. If possible,  we recommend you accept the option to have the 3D mammogram performed.  It both reduces the number of women who will be called back for extra views which then turn out to be normal, and it is better than the routine mammogram at detecting truly abnormal areas.    COLON CANCER SCREENING: Now recommend starting at age 39. At this time colonoscopy is not covered for routine screening until 50. There are take home tests that can be done between 45-49.   COLONOSCOPY:  Colonoscopy to screen for colon cancer is recommended for all women at age 66.  We know, you hate the idea of the prep.  We agree, BUT, having colon cancer and not knowing it is worse!!  Colon cancer so often starts as a polyp that can be seen and removed at colonscopy, which can quite literally save your life!  And if your first colonoscopy is normal and you have no family history of  colon cancer, most women don't have to have it again for 10 years.  Once every ten years, you can do something that may end up saving your life, right?  We will be happy to help you get it scheduled when you are ready.  Be sure to check your insurance coverage so you understand how much it will cost.  It may be covered as a preventative service at no cost, but you should check your particular policy.      Breast Self-Awareness Breast self-awareness means being familiar with how your breasts look and feel. It involves checking your  breasts regularly and reporting any changes to your health care provider. Practicing breast self-awareness is important. A change in your breasts can be a sign of a serious medical problem. Being familiar with how your breasts look and feel allows you to find any problems early, when treatment is more likely to be successful. All women should practice breast self-awareness, including women who have had breast implants. How to do a breast self-exam One way to learn what is normal for your breasts and whether your breasts are changing is to do a breast self-exam. To do a breast self-exam: Look for Changes  1. Remove all the clothing above your waist. 2. Stand in front of a mirror in a room with good lighting. 3. Put your hands on your hips. 4. Push your hands firmly downward. 5. Compare your breasts in the mirror. Look for differences between them (asymmetry), such as: ? Differences in shape. ? Differences in size. ? Puckers, dips, and bumps in one breast and not the other. 6. Look at each breast for changes in your skin, such as: ? Redness. ? Scaly areas. 7. Look for changes in your nipples, such as: ? Discharge. ? Bleeding. ? Dimpling. ? Redness. ? A change in position. Feel for Changes Carefully feel your breasts for lumps and changes. It is best to do this while lying on your back on the floor and again while sitting or standing in the shower or tub with soapy water on your skin. Feel each breast in the following way:  Place the arm on the side of the breast you are examining above your head.  Feel your breast with the other hand.  Start in the nipple area and make  inch (2 cm) overlapping circles to feel your breast. Use the pads of your three middle fingers to do this. Apply light pressure, then medium pressure, then firm pressure. The light pressure will allow you to feel the tissue closest to the skin. The medium pressure will allow you to feel the tissue that is a little deeper.  The firm pressure will allow you to feel the tissue close to the ribs.  Continue the overlapping circles, moving downward over the breast until you feel your ribs below your breast.  Move one finger-width toward the center of the body. Continue to use the  inch (2 cm) overlapping circles to feel your breast as you move slowly up toward your collarbone.  Continue the up and down exam using all three pressures until you reach your armpit.  Write Down What You Find  Write down what is normal for each breast and any changes that you find. Keep a written record with breast changes or normal findings for each breast. By writing this information down, you do not need to depend only on memory for size, tenderness, or location. Write down where you are in your menstrual cycle, if  you are still menstruating. If you are having trouble noticing differences in your breasts, do not get discouraged. With time you will become more familiar with the variations in your breasts and more comfortable with the exam. How often should I examine my breasts? Examine your breasts every month. If you are breastfeeding, the best time to examine your breasts is after a feeding or after using a breast pump. If you menstruate, the best time to examine your breasts is 5-7 days after your period is over. During your period, your breasts are lumpier, and it may be more difficult to notice changes. When should I see my health care provider? See your health care provider if you notice:  A change in shape or size of your breasts or nipples.  A change in the skin of your breast or nipples, such as a reddened or scaly area.  Unusual discharge from your nipples.  A lump or thick area that was not there before.  Pain in your breasts.  Anything that concerns you.  Common Medications Safe in Pregnancy  Acne:      Constipation:  Benzoyl Peroxide     Colace  Clindamycin      Dulcolax Suppository  Topica  Erythromycin     Fibercon  Salicylic Acid      Metamucil         Miralax AVOID:        Senakot   Accutane    Cough:  Retin-A       Cough Drops  Tetracycline      Phenergan w/ Codeine if Rx  Minocycline      Robitussin (Plain & DM)  Antibiotics:     Crabs/Lice:  Ceclor       RID  Cephalosporins    AVOID:  E-Mycins      Kwell  Keflex  Macrobid/Macrodantin   Diarrhea:  Penicillin      Kao-Pectate  Zithromax      Imodium AD         PUSH FLUIDS AVOID:       Cipro     Fever:  Tetracycline      Tylenol (Regular or Extra  Minocycline       Strength)  Levaquin      Extra Strength-Do not          Exceed 8 tabs/24 hrs Caffeine:        <242m/day (equiv. To 1 cup of coffee or  approx. 3 12 oz sodas)         Gas: Cold/Hayfever:       Gas-X  Benadryl      Mylicon  Claritin       Phazyme  **Claritin-D        Chlor-Trimeton    Headaches:  Dimetapp      ASA-Free Excedrin  Drixoral-Non-Drowsy     Cold Compress  Mucinex (Guaifenasin)     Tylenol (Regular or Extra  Sudafed/Sudafed-12 Hour     Strength)  **Sudafed PE Pseudoephedrine   Tylenol Cold & Sinus     Vicks Vapor Rub  Zyrtec  **AVOID if Problems With Blood Pressure         Heartburn: Avoid lying down for at least 1 hour after meals  Aciphex      Maalox     Rash:  Milk of Magnesia     Benadryl    Mylanta       1% Hydrocortisone Cream  Pepcid  Pepcid Complete   Sleep Aids:  Prevacid      Ambien   Prilosec       Benadryl  Rolaids       Chamomile Tea  Tums (Limit 4/day)     Unisom  Zantac       Tylenol PM         Warm milk-add vanilla or  Hemorrhoids:       Sugar for taste  Anusol/Anusol H.C.  (RX: Analapram 2.5%)  Sugar Substitutes:  Hydrocortisone OTC     Ok in moderation  Preparation H      Tucks        Vaseline lotion applied to tissue with wiping    Herpes:     Throat:  Acyclovir      Oragel  Famvir  Valtrex     Vaccines:         Flu Shot Leg Cramps:       *Gardasil  Benadryl      Hepatitis  A         Hepatitis B Nasal Spray:       Pneumovax  Saline Nasal Spray     Polio Booster         Tetanus Nausea:       Tuberculosis test or PPD  Vitamin B6 25 mg TID   AVOID:    Dramamine      *Gardasil  Emetrol       Live Poliovirus  Ginger Root 250 mg QID    MMR (measles, mumps &  High Complex Carbs @ Bedtime    rebella)  Sea Bands-Accupressure    Varicella (Chickenpox)  Unisom 1/2 tab TID     *No known complications           If received before Pain:         Known pregnancy;   Darvocet       Resume series after  Lortab        Delivery  Percocet    Yeast:   Tramadol      Femstat  Tylenol 3      Gyne-lotrimin  Ultram       Monistat  Vicodin           MISC:         All Sunscreens           Hair Coloring/highlights          Insect Repellant's          (Including DEET)         Mystic Tans  Pregnancy and Genital Herpes  Genital herpes is an STI (sexually transmitted infection) that is caused by the herpes simplex virus (HSV). HSV can cause an outbreak of itching, blisters, and sores (ulcers) around the genitals and rectum. Even when the outbreak goes away, the virus stays in the body. If you are pregnant, you can pass HSV to your baby. If you become infected with HSV for the first time while you are pregnant, the virus can cause serious problems for your baby. If you had HSV before your pregnancy, the virus may not affect your baby as seriously. Babies that are infected with HSV are at risk for developing inflammation of the brain (encephalitis), damage to organs, and problems with development. How does this affect me? Your type of delivery may be affected. You may be able to have a vaginal delivery if you have no evidence of an outbreak when you go into labor. However, your baby may need to be delivered  by C-section (cesarean delivery) if you have:  An active, recurrent, or new herpes outbreak at the time of delivery. This is because the virus can pass to your baby through an  infected birth canal. This can cause severe problems for your baby.  Any symptoms of infection in the areas around the genitals such as pain, burning, and itching, even if you do not have any ulcers in the birth canal. After delivery, you can breastfeed your baby. The virus will not be present in breast milk. While caring for your baby, you will need to take steps to avoid passing the virus on to your baby. How does this affect my baby? If the virus passes to your baby, it can cause serious problems. The virus can be passed to your baby:  Before delivery. The virus can be passed to your unborn baby through the placenta. This is more likely to happen if you get herpes for the first time in the first 3 months of pregnancy (first trimester). This may cause your baby to have a congenital disability.  During delivery. This is more likely to happen if you become infected for the first time late in your pregnancy.  After delivery. Your baby can get a herpes infection if you touch active ulcers and then touch your baby without washing your hands. The virus is less likely to pass to your baby if you had herpes before you became pregnant. This is because antibodies against the virus develop over a period of time. These antibodies help to protect the baby. How is this treated? This condition can be treated with medicines during pregnancy that are safe for you and your baby. These medicines can help to reduce symptoms, shorten an outbreak, and prevent another outbreak of the infection. If the infection happened before you became pregnant, you may need to take medicine late in your pregnancy to help to prevent a breakout at the time of delivery. Follow these instructions at home: To avoid passing the virus to your baby:  Wash your hands with soap and water often and before touching your baby.  If you have an outbreak, keep the area clean and covered.  If ulcers are present on your breast, do not breastfeed  from the affected breast. Contact a health care provider if:  You have a rash, blisters, or ulcers in the area around your genitals or rectum.  You have burning, itching, or pain in the area around your genitals or rectum.  You have trouble urinating. Summary  Genital herpes is an STI (sexually transmitted infection) that is caused by the herpes simplex virus (HSV). If you are pregnant, you can pass the virus to your baby.  Even when the outbreak goes away, the virus stays in your body.  Genital herpes can be passed to your unborn or newborn baby and cause serious problems.  This condition can be treated with medicines during pregnancy that are safe for you and your baby. Medicines can treat your symptoms, shorten the length of an outbreak, and prevent another outbreak of the infection.  If you have signs or symptoms of a herpes outbreak when you go into labor, your health care provider may recommend a C-section (cesarean delivery) to lower the risk of passing the virus to your baby. This information is not intended to replace advice given to you by your health care provider. Make sure you discuss any questions you have with your health care provider. Document Revised: 04/21/2018 Document Reviewed: 03/30/2018 Elsevier Patient  Education  [employer]. Commonly Asked Questions During Pregnancy  Cats: A parasite can be excreted in cat feces.  To avoid exposure you need to have another person empty the little box.  If you must empty the litter box you will need to wear gloves.  Wash your hands after handling your cat.  This parasite can also be found in raw or undercooked meat so this should also be avoided.  Colds, Sore Throats, Flu: Please check your medication sheet to see what you can take for symptoms.  If your symptoms are unrelieved by these medications please call the office.  Dental Work: Most any dental work Investment banker, corporate recommends is permitted.  X-rays should only be taken  during the first trimester if absolutely necessary.  Your abdomen should be shielded with a lead apron during all x-rays.  Please notify your provider prior to receiving any x-rays.  Novocaine is fine; gas is not recommended.  If your dentist requires a note from [place] prior to dental work please call the office and we will provide one for you.  Exercise: Exercise is an important part of staying healthy during your pregnancy.  You may continue most exercises you were accustomed to prior to pregnancy.  Later in your pregnancy you will most likely notice you have difficulty with activities requiring balance like riding a bicycle.  It is important that you listen to your body and avoid activities that put you at a higher risk of falling.  Adequate rest and staying well hydrated are a must!  If you have questions about the safety of specific activities ask your provider.    Exposure to Children with illness: Try to avoid obvious exposure; report any symptoms to [place] when noted,  If you have chicken pos, red measles or mumps, you should be immune to these diseases.   Please do not take any vaccines while pregnant unless you have checked with your OB provider.  Fetal Movement: After 28 weeks we recommend you do "kick counts" twice daily.  Lie or sit down in a calm quiet environment and count your baby movements "kicks".  You should feel your baby at least 10 times per hour.  If you have not felt 10 kicks within the first hour get up, walk around and have something sweet to eat or drink then repeat for an additional hour.  If count remains less than 10 per hour notify your provider.  Fumigating: Follow your pest control agent's advice as to how long to stay out of your home.  Ventilate the area well before re-entering.  Hemorrhoids:   Most over-the-counter preparations can be used during pregnancy.  Check your medication to see what is safe to use.  It is important to use a stool softener or fiber in your diet and to  drink lots of liquids.  If hemorrhoids seem to be getting worse please call the office.   Hot Tubs:  Hot tubs Jacuzzis and saunas are not recommended while pregnant.  These increase your internal body temperature and should be avoided.  Intercourse:  Sexual intercourse is safe during pregnancy as long as you are comfortable, unless otherwise advised by your provider.  Spotting may occur after intercourse; report any bright red bleeding that is heavier than spotting.  Labor:  If you know that you are in labor, please go to the hospital.  If you are unsure, please call the office and let us help you decide what to do.  Lifting, straining, etc:  If your job requires heavy lifting or straining please check with your provider for any limitations.  Generally, you should not lift items heavier than that you can lift simply with your hands and arms (no back muscles)  Painting:  Paint fumes do not harm your pregnancy, but may make you ill and should be avoided if possible.  Latex or water based paints have less odor than oils.  Use adequate ventilation while painting.  Permanents & Hair Color:  Chemicals in hair dyes are not recommended as they cause increase hair dryness which can increase hair loss during pregnancy.  " Highlighting" and permanents are allowed.  Dye may be absorbed differently and permanents may not hold as well during pregnancy.  Sunbathing:  Use a sunscreen, as skin burns easily during pregnancy.  Drink plenty of fluids; avoid over heating.  Tanning Beds:  Because their possible side effects are still unknown, tanning beds are not recommended.  Ultrasound Scans:  Routine ultrasounds are performed at approximately 20 weeks.  You will be able to see your baby's general anatomy an if you would like to know the gender this can usually be determined as well.  If it is questionable when you conceived you may also receive an ultrasound early in your pregnancy for dating purposes.  Otherwise  ultrasound exams are not routinely performed unless there is a medical necessity.  Although you can request a scan we ask that you pay for it when conducted because insurance does not cover " patient request" scans.  Work: If your pregnancy proceeds without complications you may work until your due date, unless your physician or employer advises otherwise.  Round Ligament Pain/Pelvic Discomfort:  Sharp, shooting pains not associated with bleeding are fairly common, usually occurring in the second trimester of pregnancy.  They tend to be worse when standing up or when you remain standing for long periods of time.  These are the result of pressure of certain pelvic ligaments called "round ligaments".  Rest, Tylenol and heat seem to be the most effective relief.  As the womb and fetus grow, they rise out of the pelvis and the discomfort improves.  Please notify the office if your pain seems different than that described.  It may represent a more serious condition.   Vaginitis Vaginitis is a condition in which the vaginal tissue swells and becomes red (inflamed). This condition is most often caused by a change in the normal balance of bacteria and yeast that live in the vagina. This change causes an overgrowth of certain bacteria or yeast, which causes the inflammation. There are different types of vaginitis, but the most common types are:  Bacterial vaginosis.  Yeast infection (candidiasis).  Trichomoniasis vaginitis. This is a sexually transmitted disease (STD).  Viral vaginitis.  Atrophic vaginitis.  Allergic vaginitis. What are the causes? The cause of this condition depends on the type of vaginitis. It can be caused by:  Bacteria (bacterial vaginosis).  Yeast, which is a fungus (yeast infection).  A parasite (trichomoniasis vaginitis).  A virus (viral vaginitis).  Low hormone levels (atrophic vaginitis). Low hormone levels can occur during pregnancy, breastfeeding, or after  menopause.  Irritants, such as bubble baths, scented tampons, and feminine sprays (allergic vaginitis). Other factors can change the normal balance of the yeast and bacteria that live in the vagina. These include:  Antibiotic medicines.  Poor hygiene.  Diaphragms, vaginal sponges, spermicides, birth control pills, and intrauterine devices (IUD).  Sex.  Infection.  Uncontrolled diabetes.  A  weakened defense (immune) system. What increases the risk? This condition is more likely to develop in women who:  Smoke.  Use vaginal douches, scented tampons, or scented sanitary pads.  Wear tight-fitting pants.  Wear thong underwear.  Use oral birth control pills or an IUD.  Have sex without a condom.  Have multiple sex partners.  Have an STD.  Frequently use the spermicide nonoxynol-9.  Eat lots of foods high in sugar.  Have uncontrolled diabetes.  Have low estrogen levels.  Have a weakened immune system from an immune disorder or medical treatment.  Are pregnant or breastfeeding. What are the signs or symptoms? Symptoms vary depending on the cause of the vaginitis. Common symptoms include:  Abnormal vaginal discharge. ? The discharge is white, gray, or yellow with bacterial vaginosis. ? The discharge is thick, white, and cheesy with a yeast infection. ? The discharge is frothy and yellow or greenish with trichomoniasis.  A bad vaginal smell. The smell is fishy with bacterial vaginosis.  Vaginal itching, pain, or swelling.  Sex that is painful.  Pain or burning when urinating. Sometimes there are no symptoms. How is this diagnosed? This condition is diagnosed based on your symptoms and medical history. A physical exam, including a pelvic exam, will also be done. You may also have other tests, including:  Tests to determine the pH level (acidity or alkalinity) of your vagina.  A whiff test, to assess the odor that results when a sample of your vaginal  discharge is mixed with a potassium hydroxide solution.  Tests of vaginal fluid. A sample will be examined under a microscope. How is this treated? Treatment varies depending on the type of vaginitis you have. Your treatment may include:  Antibiotic creams or pills to treat bacterial vaginosis and trichomoniasis.  Antifungal medicines, such as vaginal creams or suppositories, to treat a yeast infection.  Medicine to ease discomfort if you have viral vaginitis. Your sexual partner should also be treated.  Estrogen delivered in a cream, pill, suppository, or vaginal ring to treat atrophic vaginitis. If vaginal dryness occurs, lubricants and moisturizing creams may help. You may need to avoid scented soaps, sprays, or douches.  Stopping use of a product that is causing allergic vaginitis. Then using a vaginal cream to treat the symptoms. Follow these instructions at home: Lifestyle  Keep your genital area clean and dry. Avoid soap, and only rinse the area with water.  Do not douche or use tampons until your health care provider says it is okay to do so. Use sanitary pads, if needed.  Do not have sex until your health care provider approves. When you can return to sex, practice safe sex and use condoms.  Wipe from front to back. This avoids the spread of bacteria from the rectum to the vagina. General instructions  Take over-the-counter and prescription medicines only as told by your health care provider.  If you were prescribed an antibiotic medicine, take or use it as told by your health care provider. Do not stop taking or using the antibiotic even if you start to feel better.  Keep all follow-up visits as told by your health care provider. This is important. How is this prevented?  Use mild, non-scented products. Do not use things that can irritate the vagina, such as fabric softeners. Avoid the following products if they are scented: ? Feminine  sprays. ? Detergents. ? Tampons. ? Feminine hygiene products. ? Soaps or bubble baths.  Let air reach your genital area. ?  Wear cotton underwear to reduce moisture buildup. ? Avoid wearing underwear while you sleep. ? Avoid wearing tight pants and underwear or nylons without a cotton panel. ? Avoid wearing thong underwear.  Take off any wet clothing, such as bathing suits, as soon as possible.  Practice safe sex and use condoms. Contact a health care provider if:  You have abdominal pain.  You have a fever.  You have symptoms that last for more than 2-3 days. Get help right away if:  You have a fever and your symptoms suddenly get worse. Summary  Vaginitis is a condition in which the vaginal tissue becomes inflamed.This condition is most often caused by a change in the normal balance of bacteria and yeast that live in the vagina.  Treatment varies depending on the type of vaginitis you have.  Do not douche, use tampons , or have sex until your health care provider approves. When you can return to sex, practice safe sex and use condoms. This information is not intended to replace advice given to you by your health care provider. Make sure you discuss any questions you have with your health care provider. Document Revised: 12/10/2016 Document Reviewed: 02/03/2016 Elsevier Patient Education  2020 [employer].

## 2019-02-20 LAB — COMPREHENSIVE METABOLIC PANEL
ALT: 12 IU/L (ref 0–32)
AST: 17 IU/L (ref 0–40)
Albumin/Globulin Ratio: 1.6 (ref 1.2–2.2)
Albumin: 4.7 g/dL (ref 3.9–5.0)
Alkaline Phosphatase: 89 IU/L (ref 39–117)
BUN/Creatinine Ratio: 14 (ref 9–23)
BUN: 10 mg/dL (ref 6–20)
Bilirubin Total: <0.2 mg/dL (ref 0.0–1.2)
CO2: 24 mmol/L (ref 20–29)
Calcium: 9.7 mg/dL (ref 8.7–10.2)
Chloride: 101 mmol/L (ref 96–106)
Creatinine, Ser: 0.69 mg/dL (ref 0.57–1.00)
GFR calc Af Amer: 140 mL/min/{1.73_m2} (ref >59)
GFR calc non Af Amer: 121 mL/min/{1.73_m2} (ref >59)
Globulin, Total: 3 g/dL (ref 1.5–4.5)
Glucose: 83 mg/dL (ref 65–99)
Potassium: 4.2 mmol/L (ref 3.5–5.2)
Sodium: 138 mmol/L (ref 134–144)
Total Protein: 7.7 g/dL (ref 6.0–8.5)

## 2019-02-20 LAB — LIPID PANEL
Chol/HDL Ratio: 1.9 ratio (ref 0.0–4.4)
Cholesterol, Total: 140 mg/dL (ref 100–199)
HDL: 74 mg/dL (ref >39)
LDL Chol Calc (NIH): 55 mg/dL (ref 0–99)
Triglycerides: 47 mg/dL (ref 0–149)
VLDL Cholesterol Cal: 11 mg/dL (ref 5–40)

## 2019-02-20 LAB — HIV ANTIBODY (ROUTINE TESTING W REFLEX): HIV Screen 4th Generation wRfx: NONREACTIVE

## 2019-02-20 LAB — CBC
Hematocrit: 41.2 % (ref 34.0–46.6)
Hemoglobin: 13.3 g/dL (ref 11.1–15.9)
MCH: 27.7 pg (ref 26.6–33.0)
MCHC: 32.3 g/dL (ref 31.5–35.7)
MCV: 86 fL (ref 79–97)
Platelets: 362 10*3/uL (ref 150–450)
RBC: 4.8 x10E6/uL (ref 3.77–5.28)
RDW: 14.6 % (ref 11.7–15.4)
WBC: 7.1 10*3/uL (ref 3.4–10.8)

## 2019-02-20 LAB — HEMOGLOBIN A1C
Est. average glucose Bld gHb Est-mCnc: 117 mg/dL
Hgb A1c MFr Bld: 5.7 % — ABNORMAL HIGH (ref 4.8–5.6)

## 2019-02-20 LAB — VITAMIN D 25 HYDROXY (VIT D DEFICIENCY, FRACTURES): Vit D, 25-Hydroxy: 17.1 ng/mL — ABNORMAL LOW (ref 30.0–100.0)

## 2019-02-20 LAB — RPR: RPR Ser Ql: NONREACTIVE

## 2019-02-20 LAB — TSH: TSH: 1.16 u[IU]/mL (ref 0.450–4.500)

## 2019-02-21 ENCOUNTER — Telehealth: Payer: Self-pay

## 2019-02-21 DIAGNOSIS — E559 Vitamin D deficiency, unspecified: Secondary | ICD-10-CM

## 2019-02-21 LAB — CHLAMYDIA/GONOCOCCUS/TRICHOMONAS, NAA
Chlamydia by NAA: NEGATIVE
Gonococcus by NAA: NEGATIVE
Trich vag by NAA: NEGATIVE

## 2019-02-21 NOTE — Telephone Encounter (Signed)
Spoke with patient about low Vitamin D levels  Let her know Dr. Oscar La recommended 2,000 IU vitamin D. Also informed her that her Pre-diabetes continues and Offered to refer her to the pre-diabetic clinic patient states she wants to think about that and call back at a later date.  Made 3 month Vitamin D lab appointment for May 16, 2019 @ 9:00am and placed future order.       Dr. Gertie Exon wrote on 02/21/19 and 11:03am  Please let the patient know that she continues to have pre-diabetes and a low vit d level. The rest of her lab work is fine.  I would recommend that she take 2,000 IU of vit d3 daily and return in 3 months for a vit D level (please schedule and place the order). I would recommend referral to the prediabetic clinic, if she is okay with that please place the referral.  CC: Dr Artis Flock

## 2019-03-05 ENCOUNTER — Ambulatory Visit: Payer: 59 | Admitting: Obstetrics and Gynecology

## 2019-03-05 DIAGNOSIS — Z30431 Encounter for routine checking of intrauterine contraceptive device: Secondary | ICD-10-CM

## 2019-05-21 ENCOUNTER — Other Ambulatory Visit: Payer: Self-pay

## 2019-07-11 ENCOUNTER — Inpatient Hospital Stay (HOSPITAL_COMMUNITY): Payer: 59

## 2019-07-11 ENCOUNTER — Encounter (HOSPITAL_COMMUNITY): Payer: Self-pay | Admitting: *Deleted

## 2019-07-11 ENCOUNTER — Inpatient Hospital Stay (HOSPITAL_COMMUNITY)
Admission: AD | Admit: 2019-07-11 | Discharge: 2019-07-11 | Disposition: A | Discharge status: Home / Self Care | Payer: 59 | Attending: Obstetrics & Gynecology | Admitting: Obstetrics & Gynecology

## 2019-07-11 DIAGNOSIS — Z3491 Encounter for supervision of normal pregnancy, unspecified, first trimester: Secondary | ICD-10-CM

## 2019-07-11 DIAGNOSIS — R109 Unspecified abdominal pain: Secondary | ICD-10-CM

## 2019-07-11 DIAGNOSIS — O26891 Other specified pregnancy related conditions, first trimester: Secondary | ICD-10-CM | POA: Diagnosis not present

## 2019-07-11 DIAGNOSIS — Z8249 Family history of ischemic heart disease and other diseases of the circulatory system: Secondary | ICD-10-CM | POA: Diagnosis not present

## 2019-07-11 DIAGNOSIS — Z8349 Family history of other endocrine, nutritional and metabolic diseases: Secondary | ICD-10-CM | POA: Insufficient documentation

## 2019-07-11 DIAGNOSIS — Z3A01 Less than 8 weeks gestation of pregnancy: Secondary | ICD-10-CM

## 2019-07-11 DIAGNOSIS — Z833 Family history of diabetes mellitus: Secondary | ICD-10-CM | POA: Diagnosis not present

## 2019-07-11 LAB — CBC
HCT: 38.5 % (ref 36.0–46.0)
Hemoglobin: 12.6 g/dL (ref 12.0–15.0)
MCH: 28 pg (ref 26.0–34.0)
MCHC: 32.7 g/dL (ref 30.0–36.0)
MCV: 85.6 fL (ref 80.0–100.0)
Platelets: 348 10*3/uL (ref 150–400)
RBC: 4.5 MIL/uL (ref 3.87–5.11)
RDW: 14.1 % (ref 11.5–15.5)
WBC: 7.8 10*3/uL (ref 4.0–10.5)
nRBC: 0 % (ref 0.0–0.2)

## 2019-07-11 LAB — URINALYSIS, ROUTINE W REFLEX MICROSCOPIC
Bilirubin Urine: NEGATIVE
Glucose, UA: NEGATIVE mg/dL
Hgb urine dipstick: NEGATIVE
Ketones, ur: 5 mg/dL — AB
Leukocytes,Ua: NEGATIVE
Nitrite: NEGATIVE
Protein, ur: NEGATIVE mg/dL
Specific Gravity, Urine: 1.031 — ABNORMAL HIGH (ref 1.005–1.030)
pH: 5 (ref 5.0–8.0)

## 2019-07-11 LAB — WET PREP, GENITAL
Clue Cells Wet Prep HPF POC: NONE SEEN
Sperm: NONE SEEN
Trich, Wet Prep: NONE SEEN
Yeast Wet Prep HPF POC: NONE SEEN

## 2019-07-11 LAB — HCG, QUANTITATIVE, PREGNANCY: hCG, Beta Chain, Quant, S: 4625 m[IU]/mL — ABNORMAL HIGH (ref <5)

## 2019-07-11 NOTE — Discharge Instructions (Signed)
Return to care   If you have heavier bleeding that soaks through more that 2 pads per hour for an hour or more  If you bleed so much that you feel like you might pass out or you do pass out  If you have significant abdominal pain that is not improved with Tylenol      First Trimester of Pregnancy  The first trimester of pregnancy is from week 1 until the end of week 13 (months 1 through 3). During this time, your baby will begin to develop inside you. At 6-8 weeks, the eyes and face are formed, and the heartbeat can be seen on ultrasound. At the end of 12 weeks, all the baby's organs are formed. Prenatal care is all the medical care you receive before the birth of your baby. Make sure you get good prenatal care and follow all of your doctor's instructions. Follow these instructions at home: Medicines  Take over-the-counter and prescription medicines only as told by your doctor. Some medicines are safe and some medicines are not safe during pregnancy.  Take a prenatal vitamin that contains at least 600 micrograms (mcg) of folic acid.  If you have trouble pooping (constipation), take medicine that will make your stool soft (stool softener) if your doctor approves. Eating and drinking   Eat regular, healthy meals.  Your doctor will tell you the amount of weight gain that is right for you.  Avoid raw meat and uncooked cheese.  If you feel sick to your stomach (nauseous) or throw up (vomit): ? Eat 4 or 5 small meals a day instead of 3 large meals. ? Try eating a few soda crackers. ? Drink liquids between meals instead of during meals.  To prevent constipation: ? Eat foods that are high in fiber, like fresh fruits and vegetables, whole grains, and beans. ? Drink enough fluids to keep your pee (urine) clear or pale yellow. Activity  Exercise only as told by your doctor. Stop exercising if you have cramps or pain in your lower belly (abdomen) or low back.  Do not exercise if it is  too hot, too humid, or if you are in a place of great height (high altitude).  Try to avoid standing for long periods of time. Move your legs often if you must stand in one place for a long time.  Avoid heavy lifting.  Wear low-heeled shoes. Sit and stand up straight.  You can have sex unless your doctor tells you not to. Relieving pain and discomfort  Wear a good support bra if your breasts are sore.  Take warm water baths (sitz baths) to soothe pain or discomfort caused by hemorrhoids. Use hemorrhoid cream if your doctor says it is okay.  Rest with your legs raised if you have leg cramps or low back pain.  If you have puffy, bulging veins (varicose veins) in your legs: ? Wear support hose or compression stockings as told by your doctor. ? Raise (elevate) your feet for 15 minutes, 3-4 times a day. ? Limit salt in your food. Prenatal care  Schedule your prenatal visits by the twelfth week of pregnancy.  Write down your questions. Take them to your prenatal visits.  Keep all your prenatal visits as told by your doctor. This is important. Safety  Wear your seat belt at all times when driving.  Make a list of emergency phone numbers. The list should include numbers for family, friends, the hospital, and police and fire departments. General instructions  Ask your doctor for a referral to a local prenatal class. Begin classes no later than at the start of month 6 of your pregnancy.  Ask for help if you need counseling or if you need help with nutrition. Your doctor can give you advice or tell you where to go for help.  Do not use hot tubs, steam rooms, or saunas.  Do not douche or use tampons or scented sanitary pads.  Do not cross your legs for long periods of time.  Avoid all herbs and alcohol. Avoid drugs that are not approved by your doctor.  Do not use any tobacco products, including cigarettes, chewing tobacco, and electronic cigarettes. If you need help quitting, ask  your doctor. You may get counseling or other support to help you quit.  Avoid cat litter boxes and soil used by cats. These carry germs that can cause birth defects in the baby and can cause a loss of your baby (miscarriage) or stillbirth.  Visit your dentist. At home, brush your teeth with a soft toothbrush. Be gentle when you floss. Contact a doctor if:  You are dizzy.  You have mild cramps or pressure in your lower belly.  You have a nagging pain in your belly area.  You continue to feel sick to your stomach, you throw up, or you have watery poop (diarrhea).  You have a bad smelling fluid coming from your vagina.  You have pain when you pee (urinate).  You have increased puffiness (swelling) in your face, hands, legs, or ankles. Get help right away if:  You have a fever.  You are leaking fluid from your vagina.  You have spotting or bleeding from your vagina.  You have very bad belly cramping or pain.  You gain or lose weight rapidly.  You throw up blood. It may look like coffee grounds.  You are around people who have German measles, fifth disease, or chickenpox.  You have a very bad headache.  You have shortness of breath.  You have any kind of trauma, such as from a fall or a car accident. Summary  The first trimester of pregnancy is from week 1 until the end of week 13 (months 1 through 3).  To take care of yourself and your unborn baby, you will need to eat healthy meals, take medicines only if your doctor tells you to do so, and do activities that are safe for you and your baby.  Keep all follow-up visits as told by your doctor. This is important as your doctor will have to ensure that your baby is healthy and growing well. This information is not intended to replace advice given to you by your health care provider. Make sure you discuss any questions you have with your health care provider. Document Revised: 04/20/2018 Document Reviewed: 01/06/2016 Elsevier  Patient Education  2020 [employer] Inc[employer]   

## 2019-07-11 NOTE — MAU Note (Signed)
.   Jean Solis is a 24 y.o.  That presents to  MAU reporting: that she wants to know how far along she is. Denies any vaginal bleeding or pain at present. States that she had mild cramping early this morning.  LMP: 06/06/19  Pain score: 0 Vitals:   07/11/19 1824  BP: 132/64  Pulse: 97  Resp: 16  Temp: 98 F (36.7 C)  SpO2: 100%     FHT: Lab orders placed from triage:

## 2019-07-11 NOTE — MAU Provider Note (Signed)
Chief Complaint: pregnancy confirmation   First Provider Initiated Contact with Patient 07/11/19 1849     SUBJECTIVE HPI: Jean Solis is a 24 y.o. G2P1001 at [redacted]w[redacted]d who presents to Maternity Admissions reporting abdominal cramping. Reports intermittent cramping throughout lower abdomen earlier today. Pain resolved just prior to arrival. Denies n/v/d, dysuria, vaginal bleeding, or vaginal discharge. Plans on going to [facility] [address] for prenatal care.    Past Medical History:  Diagnosis Date  . Chlamydia 09/2011  . STD (sexually transmitted disease)    OB History  Gravida Para Term Preterm AB Living  2 1 1  0 0 1  SAB TAB Ectopic Multiple Live Births  0 0 0 0 1    # Outcome Date GA Lbr Len/2nd Weight Sex Delivery Anes PTL Lv  2 Current           1 Term 10/11/17 [redacted]w[redacted]d 14:20 / 00:18 3269 g F Vag-Spont EPI  LIV   Past Surgical History:  Procedure Laterality Date  . BARTHOLIN CYST MARSUPIALIZATION Right 2012  . TONSILLECTOMY     Social History   Socioeconomic History  . Marital status: Single    Spouse name: Not on file  . Number of children: 0  . Years of education: 72  . Highest education level: Not on file  Occupational History    Employer: [employer]  Tobacco Use  . Smoking status: Never Smoker  . Smokeless tobacco: Never Used  Vaping Use  . Vaping Use: Never used  Substance and Sexual Activity  . Alcohol use: Not Currently    Alcohol/week: 1.0 standard drink    Types: 1 Standard drinks or equivalent per week  . Drug use: No  . Sexual activity: Yes    Birth control/protection: I.U.D.  Other Topics Concern  . Not on file  Social History Narrative  . Not on file   Social Determinants of Health   Financial Resource Strain:   . Difficulty of Paying Living Expenses:   Food Insecurity:   . Worried About 12 in the Last Year:   . Programme researcher, broadcasting/film/video in the Last Year:   Transportation Needs:   . Barista (Medical):   Freight forwarder Lack of  Transportation (Non-Medical):   Physical Activity:   . Days of Exercise per Week:   . Minutes of Exercise per Session:   Stress:   . Feeling of Stress :   Social Connections:   . Frequency of Communication with Friends and Family:   . Frequency of Social Gatherings with Friends and Family:   . Attends Religious Services:   . Active Member of Clubs or Organizations:   . Attends Marland [employer] Meetings:   Banker Marital Status:   Intimate Partner Violence:   . Fear of Current or Ex-Partner:   . Emotionally Abused:   [employer]land [employer] Physically Abused:   . Sexually Abused:    Family History  Problem Relation Age of Onset  . Hypertension Mother   . Hyperlipidemia Mother   . Hyperlipidemia Father   . Hypertension Father   . Hypertension Maternal Grandmother   . Diabetes Maternal Uncle    No current facility-administered medications on file prior to encounter.   Current Outpatient Medications on File Prior to Encounter  Medication Sig Dispense Refill  . valACYclovir (VALTREX) 500 MG tablet 1 po qd, increase to BID x 3 days as needed. 90 tablet 4  . levonorgestrel (MIRENA) 20 MCG/24HR IUD 1 each by Intrauterine route once.    Marland Kitchen  metroNIDAZOLE (FLAGYL) 500 MG tablet Take 1 tablet (500 mg total) by mouth 2 (two) times daily. 14 tablet 0   No Known Allergies  I have reviewed patient's Past Medical Hx, Surgical Hx, Family Hx, Social Hx, medications and allergies.   Review of Systems  Constitutional: Negative.   Gastrointestinal: Positive for abdominal pain. Negative for constipation, diarrhea, nausea and vomiting.  Genitourinary: Negative.     OBJECTIVE Patient Vitals for the past 24 hrs:  BP Temp Pulse Resp SpO2 Height Weight  07/11/19 1824 132/64 98 F (36.7 C) 97 16 100 % 5\' 5"  (1.651 m) 80.7 kg   Constitutional: Well-developed, well-nourished female in no acute distress.  Cardiovascular: normal rate & rhythm, no murmur Respiratory: normal rate and effort. Lung sounds clear  throughout GI: Abd soft, non-tender, Pos BS x 4. No guarding or rebound tenderness MS: Extremities nontender, no edema, normal ROM Neurologic: Alert and oriented x 4.    LAB RESULTS Results for orders placed or performed during the hospital encounter of 07/11/19 (from the past 24 hour(s))  Urinalysis, Routine w reflex microscopic     Status: Abnormal   Collection Time: 07/11/19  6:40 PM  Result Value Ref Range   Color, Urine YELLOW YELLOW   APPearance HAZY (A) CLEAR   Specific Gravity, Urine 1.031 (H) 1.005 - 1.030   pH 5.0 5.0 - 8.0   Glucose, UA NEGATIVE NEGATIVE mg/dL   Hgb urine dipstick NEGATIVE NEGATIVE   Bilirubin Urine NEGATIVE NEGATIVE   Ketones, ur 5 (A) NEGATIVE mg/dL   Protein, ur NEGATIVE NEGATIVE mg/dL   Nitrite NEGATIVE NEGATIVE   Leukocytes,Ua NEGATIVE NEGATIVE  CBC     Status: None   Collection Time: 07/11/19  6:55 PM  Result Value Ref Range   WBC 7.8 4.0 - 10.5 K/uL   RBC 4.50 3.87 - 5.11 MIL/uL   Hemoglobin 12.6 12.0 - 15.0 g/dL   HCT 07/13/19 36 - 46 %   MCV 85.6 80.0 - 100.0 fL   MCH 28.0 26.0 - 34.0 pg   MCHC 32.7 30.0 - 36.0 g/dL   RDW 29.5 28.4 - 13.2 %   Platelets 348 150 - 400 K/uL   nRBC 0.0 0.0 - 0.2 %  hCG, quantitative, pregnancy     Status: Abnormal   Collection Time: 07/11/19  6:55 PM  Result Value Ref Range   hCG, Beta Chain, Quant, S 4,625 (H) <5 mIU/mL  Wet prep, genital     Status: Abnormal   Collection Time: 07/11/19  7:03 PM   Specimen: PATH Cytology Cervicovaginal Ancillary Only  Result Value Ref Range   Yeast Wet Prep HPF POC NONE SEEN NONE SEEN   Trich, Wet Prep NONE SEEN NONE SEEN   Clue Cells Wet Prep HPF POC NONE SEEN NONE SEEN   WBC, Wet Prep HPF POC MANY (A) NONE SEEN   Sperm NONE SEEN     IMAGING 07/13/19 OB LESS THAN 14 WEEKS WITH OB TRANSVAGINAL  Result Date: 07/11/2019 CLINICAL DATA:  Initial evaluation for pelvic cramping, early pregnancy. EXAM: OBSTETRIC <14 WK 07/13/2019 AND TRANSVAGINAL OB US TECHNIQUE: Both transabdominal and  transvaginal ultrasound examinations were performed for complete evaluation of the gestation as well as the maternal uterus, adnexal regions, and pelvic cul-de-sac. Transvaginal technique was performed to assess early pregnancy. COMPARISON:  None available. FINDINGS: Intrauterine gestational sac: Single Yolk sac:  Present Embryo:  Not visualized. Cardiac Activity: Negative. MSD: 6.7 mm   5 w   2 d Subchorionic hemorrhage:  None visualized. Maternal uterus/adnexae: Ovaries are normal in appearance bilaterally. No adnexal mass or free fluid within the pelvis. IMPRESSION: 1. Early intrauterine gestational sac with internal yolk sac, but no fetal pole or cardiac activity yet visualized. Recommend follow-up quantitative B-HCG levels and follow-up US in 14 days to confirm and assess viability. 2. No other acute maternal uterine or adnexal abnormality identified. Electronically Signed   By: Rise Mu M.D.   On: 07/11/2019 20:01    MAU COURSE Orders Placed This Encounter  Procedures  . Wet prep, genital  . US OB LESS THAN 14 WEEKS WITH OB TRANSVAGINAL  . Urinalysis, Routine w reflex microscopic  . CBC  . hCG, quantitative, pregnancy  . Discharge patient   No orders of the defined types were placed in this encounter.   MDM +UPT UA, wet prep, GC/chlamydia, CBC, ABO/Rh, quant hCG, and [place] today to rule out ectopic pregnancy which can be life threatening.   Wet prep & u/a negative Ultrasound shows IUGS with yolk sac   ASSESSMENT 1. Normal IUP (intrauterine pregnancy) on prenatal ultrasound, first trimester   2. Abdominal pain during pregnancy in first trimester   3. [redacted] weeks gestation of pregnancy     PLAN Discharge home in stable condition. GC/CT pending Discussed reasons to return to MAU Call [facility] to schedule new OB appointment    Follow-up Information    [facility] Follow up.   Specialty: Obstetrics and Gynecology Why: call to schedule new OB  appointment Contact information: [address] [place] [address] [phone]             Allergies as of 07/11/2019   No Known Allergies     Medication List    STOP taking these medications   levonorgestrel 20 MCG/24HR IUD Commonly known as: MIRENA   metroNIDAZOLE 500 MG tablet Commonly known as: FLAGYL     TAKE these medications   valACYclovir 500 MG tablet Commonly known as: VALTREX 1 po qd, increase to BID x 3 days as needed.        Judeth Horn, NP 07/11/2019  8:16 PM

## 2019-07-12 LAB — GC/CHLAMYDIA PROBE AMP (~~LOC~~) NOT AT ARMC
Chlamydia: NEGATIVE
Comment: NEGATIVE
Comment: NORMAL
Neisseria Gonorrhea: NEGATIVE

## 2019-08-17 ENCOUNTER — Ambulatory Visit (INDEPENDENT_AMBULATORY_CARE_PROVIDER_SITE_OTHER): Payer: 59

## 2019-08-17 DIAGNOSIS — Z3480 Encounter for supervision of other normal pregnancy, unspecified trimester: Secondary | ICD-10-CM | POA: Insufficient documentation

## 2019-08-17 NOTE — Progress Notes (Signed)
I connected with  Jean Solis at home, I am in the Office [facility] on 08/17/19 by a video enabled telemedicine application and verified that I am speaking with the correct person using two identifiers.   I discussed the limitations of evaluation and management by telemedicine. The patient expressed understanding and agreed to proceed.  PRENATAL INTAKE SUMMARY  Jean Solis presents today New OB Nurse Interview.  OB History    Gravida  2   Para  1   Term  1   Preterm  0   AB  0   Living  1     SAB  0   TAB  0   Ectopic  0   Multiple  0   Live Births  1          I have reviewed the patient's medical, obstetrical, social, and family histories, medications, and available lab results.  SUBJECTIVE She has no unusual complaints  OBJECTIVE Initial Physical Exam (New OB)  GENERAL APPEARANCE: oriented to person, place and time   ASSESSMENT Normal pregnancy  PLAN Prenatal care OB Prenatal labs will be done at [facility] visit. Pregnancy Risk Screening done.

## 2019-08-24 ENCOUNTER — Encounter: Payer: 59 | Admitting: Nurse Practitioner

## 2019-08-27 ENCOUNTER — Other Ambulatory Visit: Payer: Self-pay

## 2019-08-27 ENCOUNTER — Other Ambulatory Visit (HOSPITAL_COMMUNITY)
Admission: RE | Admit: 2019-08-27 | Discharge: 2019-08-27 | Disposition: A | Discharge status: Home / Self Care | Payer: 59 | Source: Ambulatory Visit | Attending: Obstetrics | Admitting: Obstetrics

## 2019-08-27 ENCOUNTER — Ambulatory Visit (INDEPENDENT_AMBULATORY_CARE_PROVIDER_SITE_OTHER): Payer: 59 | Admitting: Obstetrics

## 2019-08-27 ENCOUNTER — Encounter: Payer: Self-pay | Admitting: Obstetrics

## 2019-08-27 VITALS — BP 132/75 | HR 93 | Wt 186.0 lb

## 2019-08-27 DIAGNOSIS — Z3480 Encounter for supervision of other normal pregnancy, unspecified trimester: Secondary | ICD-10-CM | POA: Insufficient documentation

## 2019-08-27 DIAGNOSIS — O99210 Obesity complicating pregnancy, unspecified trimester: Secondary | ICD-10-CM

## 2019-08-27 DIAGNOSIS — Z3A11 11 weeks gestation of pregnancy: Secondary | ICD-10-CM

## 2019-08-27 DIAGNOSIS — Z3481 Encounter for supervision of other normal pregnancy, first trimester: Secondary | ICD-10-CM

## 2019-08-27 DIAGNOSIS — Z113 Encounter for screening for infections with a predominantly sexual mode of transmission: Secondary | ICD-10-CM

## 2019-08-27 DIAGNOSIS — Z87898 Personal history of other specified conditions: Secondary | ICD-10-CM

## 2019-08-27 DIAGNOSIS — T733XXA Exhaustion due to excessive exertion, initial encounter: Secondary | ICD-10-CM

## 2019-08-27 DIAGNOSIS — O99211 Obesity complicating pregnancy, first trimester: Secondary | ICD-10-CM

## 2019-08-27 NOTE — Progress Notes (Addendum)
Subjective:    Jean Solis is being seen today for her first obstetrical visit.  This is not a planned pregnancy. She is at [redacted]w[redacted]d gestation. Her obstetrical history is significant for obesity. Relationship with FOB: significant other, not living together. Patient does intend to breast feed. Pregnancy history fully reviewed.  She complains of the heat and the strain at work of lifting and bending, and the number of hours on job.  The information documented in the HPI was reviewed and verified.  Menstrual History: OB History    Gravida  2   Para  1   Term  1   Preterm  0   AB  0   Living  1     SAB  0   TAB  0   Ectopic  0   Multiple  0   Live Births  1           Patient's last menstrual period was 06/06/2019.    Past Medical History:  Diagnosis Date  . Chlamydia 09/2011  . Headache   . STD (sexually transmitted disease)     Past Surgical History:  Procedure Laterality Date  . BARTHOLIN CYST MARSUPIALIZATION Right 2012  . TONSILLECTOMY      (Not in a hospital admission)  No Known Allergies  Social History   Tobacco Use  . Smoking status: Never Smoker  . Smokeless tobacco: Never Used  Substance Use Topics  . Alcohol use: Not Currently    Alcohol/week: 1.0 standard drink    Types: 1 Standard drinks or equivalent per week    Family History  Problem Relation Age of Onset  . Hypertension Mother   . Hyperlipidemia Mother   . Hyperlipidemia Father   . Hypertension Father   . Hypertension Maternal Grandmother   . Diabetes Maternal Uncle      Review of Systems Constitutional: negative for weight loss Gastrointestinal: negative for vomiting Genitourinary:negative for genital lesions and vaginal discharge and dysuria Musculoskeletal:negative for back pain Behavioral/Psych: negative for abusive relationship, depression, illegal drug usage and tobacco use    Objective:    BP 132/75   Pulse 93   Wt 186 lb (84.4 kg)   LMP 06/06/2019   BMI 30.95  kg/m  General Appearance:    Alert, cooperative, no distress, appears stated age  Head:    Normocephalic, without obvious abnormality, atraumatic  Eyes:    PERRL, conjunctiva/corneas clear, EOM's intact, fundi    benign, both eyes  Ears:    Normal TM's and external ear canals, both ears  Nose:   Nares normal, septum midline, mucosa normal, no drainage    or sinus tenderness  Throat:   Lips, mucosa, and tongue normal; teeth and gums normal  Neck:   Supple, symmetrical, trachea midline, no adenopathy;    thyroid:  no enlargement/tenderness/nodules; no carotid   bruit or JVD  Back:     Symmetric, no curvature, ROM normal, no CVA tenderness  Lungs:     Clear to auscultation bilaterally, respirations unlabored  Chest Wall:    No tenderness or deformity   Heart:    Regular rate and rhythm, S1 and S2 normal, no murmur, rub   or gallop  Breast Exam:    No tenderness, masses, or nipple abnormality  Abdomen:     Soft, non-tender, bowel sounds active all four quadrants,    no masses, no organomegaly  Genitalia:    Normal female without lesion, discharge or tenderness  Extremities:   Extremities  normal, atraumatic, no cyanosis or edema  Pulses:   2+ and symmetric all extremities  Skin:   Skin color, texture, turgor normal, no rashes or lesions  Lymph nodes:   Cervical, supraclavicular, and axillary nodes normal  Neurologic:   CNII-XII intact, normal strength, sensation and reflexes    throughout      Lab Review Urine pregnancy test Labs reviewed yes Radiologic studies reviewed no  Assessment:    Pregnancy at [redacted]w[redacted]d weeks    Plan:     1. Supervision of other normal pregnancy, antepartum Rx: - Cytology - PAP( [place]) - Cervicovaginal ancillary only( [place]) - Genetic Screening - CBC/D/Plt+RPR+Rh+ABO+Rub Ab... - Culture, OB Urine - [place] MFM OB COMP + 14 WK; Future - Enroll Patient in Babyscripts - Babyscripts Schedule Optimization  2. History of prediabetes Rx: -  Hemoglobin A1c  3. Obesity affecting pregnancy, antepartum  4. Screen for STD (sexually transmitted disease) Rx: - HIV Antibody (routine testing w rflx) - Hepatitis B surface antigen - RPR - Hepatitis C antibody  5. Fatigue due to excessive exertion, initial encounter - recommended the following:      1.  No more than 8 hour work day      2.  No lifting more than 20 lbs, or excessive bending       3.  Air conditioned vehicle during summer heat       4.  Ability to have bathroom breaks when needed   Prenatal vitamins.  Counseling provided regarding continued use of seat belts, cessation of alcohol consumption, smoking or use of illicit drugs; infection precautions i.e., influenza/TDAP immunizations, toxoplasmosis,CMV, parvovirus, listeria and varicella; workplace safety, exercise during pregnancy; routine dental care, safe medications, sexual activity, hot tubs, saunas, pools, travel, caffeine use, fish and methlymercury, potential toxins, hair treatments, varicose veins Weight gain recommendations per IOM guidelines reviewed: underweight/BMI< 18.5--> gain 28 - 40 lbs; normal weight/BMI 18.5 - 24.9--> gain 25 - 35 lbs; overweight/BMI 25 - 29.9--> gain 15 - 25 lbs; obese/BMI >30->gain  11 - 20 lbs Problem list reviewed and updated. FIRST/CF mutation testing/NIPT/QUAD SCREEN/fragile X/Ashkenazi Jewish population testing/Spinal muscular atrophy discussed: requested. Role of ultrasound in pregnancy discussed; fetal survey: requested. Amniocentesis discussed: not indicated.   Orders Placed This Encounter  Procedures  . Culture, OB Urine  . Urine Culture, OB Reflex  . [place] MFM OB COMP + 14 WK    Standing Status:   Future    Standing Expiration Date:   08/23/2020    Order Specific Question:   Reason for Exam (SYMPTOM  OR DIAGNOSIS REQUIRED)    Answer:   Anatomy    Order Specific Question:   Preferred Location    Answer:   [facility] Ultrasound  . Genetic Screening  .  CBC/D/Plt+RPR+Rh+ABO+Rub Ab...  . Hemoglobin A1c    PreDiabetic  . HIV Antibody (routine testing w rflx)  . Hepatitis B surface antigen  . RPR  . Hepatitis C antibody  . Interpretation:    Follow up in 4 weeks. 50% of 20 min visit spent on counseling and coordination of care.     Brock Bad, MD 08/29/2019 2:53 PM

## 2019-08-27 NOTE — Progress Notes (Signed)
New OB, reports no complaints today.

## 2019-08-28 LAB — CERVICOVAGINAL ANCILLARY ONLY
Bacterial Vaginitis (gardnerella): POSITIVE — AB
Candida Glabrata: NEGATIVE
Candida Vaginitis: NEGATIVE
Chlamydia: NEGATIVE
Comment: NEGATIVE
Comment: NEGATIVE
Comment: NEGATIVE
Comment: NEGATIVE
Comment: NEGATIVE
Comment: NORMAL
Neisseria Gonorrhea: NEGATIVE
Trichomonas: NEGATIVE

## 2019-08-28 LAB — CBC/D/PLT+RPR+RH+ABO+RUB AB...
Antibody Screen: NEGATIVE
Basophils Absolute: 0 10*3/uL (ref 0.0–0.2)
Basos: 0 %
EOS (ABSOLUTE): 0.2 10*3/uL (ref 0.0–0.4)
Eos: 3 %
HCV Ab: <0.1 s/co ratio (ref 0.0–0.9)
HIV Screen 4th Generation wRfx: NONREACTIVE
Hematocrit: 38.2 % (ref 34.0–46.6)
Hemoglobin: 12.6 g/dL (ref 11.1–15.9)
Hepatitis B Surface Ag: NEGATIVE
Immature Grans (Abs): 0 10*3/uL (ref 0.0–0.1)
Immature Granulocytes: 0 %
Lymphocytes Absolute: 1.8 10*3/uL (ref 0.7–3.1)
Lymphs: 23 %
MCH: 28.3 pg (ref 26.6–33.0)
MCHC: 33 g/dL (ref 31.5–35.7)
MCV: 86 fL (ref 79–97)
Monocytes Absolute: 0.4 10*3/uL (ref 0.1–0.9)
Monocytes: 5 %
Neutrophils Absolute: 5.4 10*3/uL (ref 1.4–7.0)
Neutrophils: 69 %
Platelets: 316 10*3/uL (ref 150–450)
RBC: 4.46 x10E6/uL (ref 3.77–5.28)
RDW: 13.3 % (ref 11.7–15.4)
RPR Ser Ql: NONREACTIVE
Rh Factor: POSITIVE
Rubella Antibodies, IGG: 4.13 index (ref >0.99)
WBC: 7.9 10*3/uL (ref 3.4–10.8)

## 2019-08-28 LAB — HCV INTERPRETATION

## 2019-08-28 LAB — HEMOGLOBIN A1C
Est. average glucose Bld gHb Est-mCnc: 111 mg/dL
Hgb A1c MFr Bld: 5.5 % (ref 4.8–5.6)

## 2019-08-29 ENCOUNTER — Encounter: Payer: Self-pay | Admitting: *Deleted

## 2019-08-29 LAB — CYTOLOGY - PAP
Comment: NEGATIVE
Diagnosis: NEGATIVE
High risk HPV: NEGATIVE

## 2019-08-29 LAB — CULTURE, OB URINE

## 2019-08-29 LAB — URINE CULTURE, OB REFLEX

## 2019-09-04 ENCOUNTER — Encounter: Payer: Self-pay | Admitting: Obstetrics

## 2019-09-07 ENCOUNTER — Other Ambulatory Visit: Payer: Self-pay

## 2019-09-07 MED ORDER — METRONIDAZOLE 500 MG PO TABS
500.0000 mg | ORAL_TABLET | Freq: Two times a day (BID) | ORAL | 0 refills | Status: DC
Start: 2019-09-07 — End: 2019-09-24

## 2019-09-07 NOTE — Progress Notes (Signed)
Received vm from pt noticed +BV in Mychart but pharmacy has no meds on file Flagyl sent to the pharmacy

## 2019-09-12 ENCOUNTER — Encounter: Payer: Self-pay | Admitting: Obstetrics

## 2019-09-13 ENCOUNTER — Telehealth: Payer: Self-pay

## 2019-09-13 NOTE — Telephone Encounter (Signed)
Patient is calling to discuss medication refill of Valtrex with nurse. Patient stated pharmacy cannot refill it due to a caution of pregnancy.

## 2019-09-13 NOTE — Telephone Encounter (Signed)
Spoke with pt. Pt states going to pharmacy and getting refill of Valtrex prescription. Pt is [redacted] weeks pregnant currently.Pt states has been taking intermittently for suppression. States no current outbreak.  Pt advised to call OB office to let them advise on Rx.  Pt agreeable and verbalized understanding.  Encounter closed.

## 2019-09-24 ENCOUNTER — Other Ambulatory Visit: Payer: Self-pay

## 2019-09-24 ENCOUNTER — Ambulatory Visit (INDEPENDENT_AMBULATORY_CARE_PROVIDER_SITE_OTHER): Payer: 59 | Admitting: Nurse Practitioner

## 2019-09-24 ENCOUNTER — Other Ambulatory Visit (HOSPITAL_COMMUNITY)
Admission: RE | Admit: 2019-09-24 | Discharge: 2019-09-24 | Disposition: A | Discharge status: Home / Self Care | Payer: 59 | Source: Ambulatory Visit | Attending: Nurse Practitioner | Admitting: Nurse Practitioner

## 2019-09-24 VITALS — BP 124/74 | HR 90 | Wt 186.2 lb

## 2019-09-24 DIAGNOSIS — Z23 Encounter for immunization: Secondary | ICD-10-CM | POA: Diagnosis not present

## 2019-09-24 DIAGNOSIS — O26892 Other specified pregnancy related conditions, second trimester: Secondary | ICD-10-CM | POA: Diagnosis present

## 2019-09-24 DIAGNOSIS — A6000 Herpesviral infection of urogenital system, unspecified: Secondary | ICD-10-CM

## 2019-09-24 DIAGNOSIS — N898 Other specified noninflammatory disorders of vagina: Secondary | ICD-10-CM | POA: Diagnosis present

## 2019-09-24 DIAGNOSIS — Z3A15 15 weeks gestation of pregnancy: Secondary | ICD-10-CM

## 2019-09-24 DIAGNOSIS — Z3480 Encounter for supervision of other normal pregnancy, unspecified trimester: Secondary | ICD-10-CM

## 2019-09-24 MED ORDER — VALACYCLOVIR HCL 500 MG PO TABS
500.0000 mg | ORAL_TABLET | Freq: Two times a day (BID) | ORAL | 8 refills | Status: DC
Start: 2019-09-24 — End: 2020-03-08

## 2019-09-24 MED ORDER — VITAFOL ULTRA 29-0.6-0.4-200 MG PO CAPS: 1.0000 | ORAL_CAPSULE | Freq: Every day | ORAL | 12 refills | Status: DC

## 2019-09-24 NOTE — Progress Notes (Signed)
Patient states that she has not started feeling fetal movement yet, reports occasional cramping.

## 2019-09-24 NOTE — Progress Notes (Signed)
    Subjective:  Jean Solis is a 24 y.o. G2P1001 at [redacted]w[redacted]d being seen today for ongoing prenatal care.  She is currently monitored for the following issues for this low-risk pregnancy and has Eczema; Obesity affecting pregnancy, antepartum; Vitamin D deficiency; Obesity (BMI 30.0-34.9); Pre-diabetes; Genital herpes; and Supervision of other normal pregnancy, antepartum on their problem list.  Patient reports fatigue still but better since she got the work note to work 8 hours per day.  Additionally she has vaginal itching that has not resolved with the last treatment of metronidazole for BV.Marland Kitchen  Contractions: Irritability. Vag. Bleeding: None.   . Denies leaking of fluid.   The following portions of the patient's history were reviewed and updated as appropriate: allergies, current medications, past family history, past medical history, past social history, past surgical history and problem list. Problem list updated.  Objective:   Vitals:   09/24/19 1504  BP: 124/74  Pulse: 90  Weight: 186 lb 3.2 oz (84.5 kg)    Fetal Status: Fetal Heart Rate (bpm): 156         General:  Alert, oriented and cooperative. Patient is in no acute distress.  Skin: Skin is warm and dry. No rash noted.   Cardiovascular: Normal heart rate noted  Respiratory: Normal respiratory effort, no problems with respiration noted  Abdomen: Soft, gravid, appropriate for gestational age. Pain/Pressure: Present     Pelvic:  Cervical exam deferred      Vaginal swab done and mucosa at introitus is red with pale yellow vaginal discharge  Extremities: Normal range of motion.  Edema: None  Mental Status: Normal mood and affect. Normal behavior. Normal judgment and thought content.   Urinalysis:      Assessment and Plan:  Pregnancy: G2P1001 at [redacted]w[redacted]d  1. Supervision of other normal pregnancy, antepartum Flu shot given today Does not yet have BP cuff  - AFP, Serum, Open Spina Bifida  2. Genital herpes simplex, unspecified  site Has had HSV outbreak in this pregnancy - will start suppression in pregnancy now.  Valtrex 500 mg BID for the remainer of the pregnancy prescribed. Is wondering if she could have C/S for birth given her history of HSV.  Has friends who say their baby got HSV from a vaginal birth.  Will have client discuss with MD.  Algis Downs that with suppression taken daily, vaginal birth is usually recommended.  3. Vaginal discharge during pregnancy in second trimester Vaginal swab done - red mucosa If BV again, will try tinadazole or Metrogel to treat.  - Cervicovaginal ancillary only( [place])  4. [redacted] weeks gestation of pregnancy   Preterm labor symptoms and general obstetric precautions including but not limited to vaginal bleeding, contractions, leaking of fluid and fetal movement were reviewed in detail with the patient. Please refer to After Visit Summary for other counseling recommendations.  Return in about 4 weeks (around 10/22/2019) for in person ROB with MD - concerns about HSV and delivery plan.  Nolene Bernheim, RN, MSN, NP-BC Nurse Practitioner, [facility] for [employer], [facility] 09/24/2019 3:34 PM

## 2019-09-25 LAB — CERVICOVAGINAL ANCILLARY ONLY
Bacterial Vaginitis (gardnerella): NEGATIVE
Candida Glabrata: NEGATIVE
Candida Vaginitis: NEGATIVE
Chlamydia: NEGATIVE
Comment: NEGATIVE
Comment: NEGATIVE
Comment: NEGATIVE
Comment: NEGATIVE
Comment: NEGATIVE
Comment: NORMAL
Neisseria Gonorrhea: NEGATIVE
Trichomonas: NEGATIVE

## 2019-09-26 LAB — AFP, SERUM, OPEN SPINA BIFIDA
AFP MoM: 1.91
AFP Value: 59.1 ng/mL
Gest. Age on Collection Date: 15.5 weeks
Maternal Age At EDD: 26.3 yr
OSBR Risk 1 IN: 1975
Test Results:: NEGATIVE
Weight: 186 [lb_av]

## 2019-09-27 ENCOUNTER — Encounter: Payer: Self-pay | Admitting: Nurse Practitioner

## 2019-09-27 DIAGNOSIS — D563 Thalassemia minor: Secondary | ICD-10-CM | POA: Insufficient documentation

## 2019-10-17 ENCOUNTER — Other Ambulatory Visit: Payer: Self-pay | Admitting: Obstetrics

## 2019-10-17 ENCOUNTER — Ambulatory Visit: Payer: 59 | Attending: Obstetrics

## 2019-10-17 ENCOUNTER — Other Ambulatory Visit: Payer: Self-pay

## 2019-10-17 DIAGNOSIS — Z3480 Encounter for supervision of other normal pregnancy, unspecified trimester: Secondary | ICD-10-CM

## 2019-10-18 ENCOUNTER — Other Ambulatory Visit: Payer: Self-pay | Admitting: *Deleted

## 2019-10-18 DIAGNOSIS — Z362 Encounter for other antenatal screening follow-up: Secondary | ICD-10-CM

## 2019-10-22 ENCOUNTER — Other Ambulatory Visit: Payer: Self-pay

## 2019-10-22 ENCOUNTER — Ambulatory Visit (INDEPENDENT_AMBULATORY_CARE_PROVIDER_SITE_OTHER): Payer: 59 | Admitting: Obstetrics and Gynecology

## 2019-10-22 ENCOUNTER — Encounter: Payer: Self-pay | Admitting: Obstetrics and Gynecology

## 2019-10-22 ENCOUNTER — Encounter: Payer: Self-pay | Admitting: Obstetrics

## 2019-10-22 VITALS — BP 117/76 | HR 90 | Wt 190.3 lb

## 2019-10-22 DIAGNOSIS — A6000 Herpesviral infection of urogenital system, unspecified: Secondary | ICD-10-CM

## 2019-10-22 DIAGNOSIS — O99210 Obesity complicating pregnancy, unspecified trimester: Secondary | ICD-10-CM

## 2019-10-22 DIAGNOSIS — Z3480 Encounter for supervision of other normal pregnancy, unspecified trimester: Secondary | ICD-10-CM

## 2019-10-22 MED ORDER — COMFORT FIT MATERNITY SUPP MED MISC: 0 refills | Status: DC

## 2019-10-22 MED ORDER — VITAFOL ULTRA 29-0.6-0.4-200 MG PO CAPS: 1.0000 | ORAL_CAPSULE | Freq: Every day | ORAL | 12 refills | Status: DC

## 2019-10-22 NOTE — Progress Notes (Signed)
ROB [redacted]w[redacted]d  AFP WNL  CC: none   Pt needs PNV's sent to pharmacy pt states they were never sent.

## 2019-10-22 NOTE — Patient Instructions (Signed)
Considering Waterbirth? Guide for patients at [facility] Why consider waterbirth? . Gentle birth for babies  . Less pain medicine used in labor  . May allow for passive descent/less pushing  . May reduce perineal tears  . More mobility and instinctive maternal position changes  . Increased maternal relaxation  . Reduced blood pressure in labor   Is waterbirth safe? What are the risks of infection, drowning or other complications? . Infection:  . Very low risk (3.7 % for tub vs 4.8% for bed)  . 7 in 8000 waterbirths with documented infection  . Poorly cleaned equipment most common cause  . Slightly lower group B strep transmission rate  . Drowning  . Maternal:  . Very low risk  . Related to seizures or fainting  . Newborn:  . Very low risk. No evidence of increased risk of respiratory problems in multiple large studies  . Physiological protection from breathing under water  . Avoid underwater birth if there are any fetal complications  . Once baby's head is out of the water, keep it out.  . Birth complication  . Some reports of cord trauma, but risk decreased by bringing baby to surface gradually  . No evidence of increased risk of shoulder dystocia. Mothers can usually change positions faster in water than in a bed, possibly aiding the maneuvers to free the shoulder.  ? You must attend a Waterbirth class at [facility] [place] . 3rd Wednesday of every month from 7-9pm  . Free  . Register by calling [phone] or online at [email or web address]  . Bring us the certificate from the class to your prenatal appointment  Meet with a midwife at 36 weeks to see if you can still plan a waterbirth and to sign the consent.   If you plan a waterbirth at [facility], the following purchases are optional: . Fish Net . Bathing suit top  . Long-handled mirror  .  Things that would prevent you from having a  waterbirth: . Unknown or Positive COVID-19 diagnosis upon admission to hospital  . Premature, <37wks  . Previous cesarean birth  . Presence of thick meconium-stained fluid  . Multiple gestation (Twins, triplets, etc.)  . Uncontrolled diabetes or gestational diabetes requiring medication  . Hypertension diagnosed in pregnancy or preexisting hypertension (gestational hypertension, preeclampsia, or chronic hypertension) . Heavy vaginal bleeding  . Non-reassuring fetal heart rate  . Active infection (MRSA, etc.). Group B Strep is NOT a contraindication for waterbirth.  . If your labor has to be induced and induction method requires continuous monitoring of the baby's heart rate  . Other risks/issues identified by your obstetrical provider  .  Please remember that birth is unpredictable. Under certain unforeseeable circumstances your provider may advise against giving birth in the tub. These decisions will be made on a case-by-case basis and with the safety of you and your baby as our highest priority.  **Please remember that in order to have a waterbirth, you must test Negative to COVID-19 upon admission to the hospital.**  

## 2019-10-22 NOTE — Progress Notes (Signed)
   PRENATAL VISIT NOTE  Subjective:  Jean Solis is a 24 y.o. G2P1001 at [redacted]w[redacted]d being seen today for ongoing prenatal care.  She is currently monitored for the following issues for this low-risk pregnancy and has Eczema; Obesity affecting pregnancy, antepartum; Vitamin D deficiency; Obesity (BMI 30.0-34.9); Pre-diabetes; Genital herpes; Supervision of other normal pregnancy, antepartum; and Alpha thalassemia silent carrier on their problem list.  Patient reports no complaints.  Contractions: Not present. Vag. Bleeding: None.  Movement: Absent. Denies leaking of fluid.   The following portions of the patient's history were reviewed and updated as appropriate: allergies, current medications, past family history, past medical history, past social history, past surgical history and problem list.   Objective:   Vitals:   10/22/19 1513  BP: 117/76  Pulse: 90  Weight: 190 lb 4.8 oz (86.3 kg)    Fetal Status: Fetal Heart Rate (bpm): 154   Movement: Absent     General:  Alert, oriented and cooperative. Patient is in no acute distress.  Skin: Skin is warm and dry. No rash noted.   Cardiovascular: Normal heart rate noted  Respiratory: Normal respiratory effort, no problems with respiration noted  Abdomen: Soft, gravid, appropriate for gestational age.  Pain/Pressure: Present     Pelvic: Cervical exam deferred        Extremities: Normal range of motion.  Edema: None  Mental Status: Normal mood and affect. Normal behavior. Normal judgment and thought content.   Assessment and Plan:  Pregnancy: G2P1001 at [redacted]w[redacted]d 1. 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 Genital herpes simplex, unspecified site Valtrex in third trimester  3. Obesity affecting pregnancy, antepartum   Preterm labor symptoms and general obstetric precautions including but not limited to vaginal bleeding,  contractions, leaking of fluid and fetal movement were reviewed in detail with the patient. Please refer to After Visit Summary for other counseling recommendations.   Return in about 4 weeks (around 11/19/2019) for in person, ROB, High risk.  Future Appointments  Date Time Provider Department Center  11/14/2019  2:30 PM [facility] NURSE [facility]  11/14/2019  2:45 PM [facility] US4 [facility]    Catalina Antigua, MD

## 2019-10-30 ENCOUNTER — Ambulatory Visit: Payer: 59 | Admitting: Family Medicine

## 2019-11-06 ENCOUNTER — Ambulatory Visit (INDEPENDENT_AMBULATORY_CARE_PROVIDER_SITE_OTHER): Payer: 59 | Admitting: Family Medicine

## 2019-11-06 ENCOUNTER — Encounter: Payer: Self-pay | Admitting: Family Medicine

## 2019-11-06 ENCOUNTER — Other Ambulatory Visit: Payer: Self-pay

## 2019-11-06 DIAGNOSIS — G8929 Other chronic pain: Secondary | ICD-10-CM | POA: Diagnosis not present

## 2019-11-06 DIAGNOSIS — Z3A21 21 weeks gestation of pregnancy: Secondary | ICD-10-CM | POA: Diagnosis not present

## 2019-11-06 DIAGNOSIS — M545 Low back pain, unspecified: Secondary | ICD-10-CM

## 2019-11-06 NOTE — Progress Notes (Signed)
Office Visit Note   Patient: Jean Solis           Date of Birth: 1995/06/23           MRN: [id number] Visit Date: 11/06/2019 Requested by: Orland Mustard, MD [address] [place],  [place] [address] PCP: Orland Mustard, MD  Subjective: Chief Complaint  Patient presents with  . Lower Back - Pain    Chronic intermittent low back pain. Is worse lately, with her pregnancy. Works as a Museum/gallery curator. Hurts with much walking. Muscle spasms in bilateral lower legs. No numbness/tingling. No shooting pain down the legs.    HPI: She is here with low back pain.  She said intermittent troubles for years, but since she has been pregnant and working at the [employer], her pain has gotten worse.  Midline lumbosacral pain without sciatica.  Denies bowel or bladder dysfunction.  Pain is worst when standing upright, better when sitting.  She is not taking medication for the pain.  She is [redacted] weeks pregnant.  In the past she has had MRI scan which showed disc bulges per her report.  She has vitamin D deficiency which is not being treated.               ROS:   All other systems were reviewed and are negative.  Objective: Vital Signs: LMP 06/06/2019   Physical Exam:  General:  Alert and oriented, in no acute distress. Pulm:  Breathing unlabored. Psy:  Normal mood, congruent affect.  Low back: She is tender in the paraspinous muscles from L2-S1 bilaterally.  No bony tenderness, no tenderness over the SI joint or in the sciatic notch.  Straight leg raise negative, lower extremity strength and reflexes are normal.  Imaging: None today  Assessment & Plan: 1.  Chronic myofascial low back pain with nonfocal neurologic exam.  [redacted] weeks pregnant. -Referral to [facility] for physical therapy.  She would likely benefit from water therapy as well. -We will optimize vitamin D levels, take 5000 IU daily. -Sit down work with occasional standing as needed, minimize walking at work for the  duration of her pregnancy. -Follow-up as needed if symptoms worsen.     Procedures: No procedures performed  No notes on file     PMFS History: Patient Active Problem List   Diagnosis Date Noted  . Alpha thalassemia silent carrier 09/27/2019  . Supervision of other normal pregnancy, antepartum 08/17/2019  . Genital herpes 03/01/2018  . Pre-diabetes 07/20/2017  . Obesity (BMI 30.0-34.9) 04/07/2017  . Vitamin D deficiency 03/28/2017  . Obesity affecting pregnancy, antepartum 03/23/2017  . Eczema 12/27/2011   Past Medical History:  Diagnosis Date  . Chlamydia 09/2011  . Headache   . STD (sexually transmitted disease)     Family History  Problem Relation Age of Onset  . Hypertension Mother   . Hyperlipidemia Mother   . Hyperlipidemia Father   . Hypertension Father   . Hypertension Maternal Grandmother   . Diabetes Maternal Uncle     Past Surgical History:  Procedure Laterality Date  . BARTHOLIN CYST MARSUPIALIZATION Right 2012  . TONSILLECTOMY     Social History   Occupational History  . Not on file  Tobacco Use  . Smoking status: Never Smoker  . Smokeless tobacco: Never Used  Vaping Use  . Vaping Use: Never used  Substance and Sexual Activity  . Alcohol use: Not Currently    Alcohol/week: 1.0 standard drink    Types:  1 Standard drinks or equivalent per week  . Drug use: No  . Sexual activity: Yes    Birth control/protection: None

## 2019-11-06 NOTE — Patient Instructions (Signed)
   Vitamin D3:  Take 5,000 IU daily    

## 2019-11-14 ENCOUNTER — Ambulatory Visit: Payer: 59 | Attending: Obstetrics and Gynecology

## 2019-11-14 ENCOUNTER — Other Ambulatory Visit: Payer: Self-pay

## 2019-11-14 ENCOUNTER — Encounter: Payer: Self-pay | Admitting: *Deleted

## 2019-11-14 ENCOUNTER — Ambulatory Visit: Payer: 59 | Admitting: *Deleted

## 2019-11-14 DIAGNOSIS — Z362 Encounter for other antenatal screening follow-up: Secondary | ICD-10-CM | POA: Insufficient documentation

## 2019-11-14 DIAGNOSIS — Z3480 Encounter for supervision of other normal pregnancy, unspecified trimester: Secondary | ICD-10-CM | POA: Insufficient documentation

## 2019-11-14 DIAGNOSIS — Z148 Genetic carrier of other disease: Secondary | ICD-10-CM | POA: Diagnosis not present

## 2019-11-14 DIAGNOSIS — Z3A23 23 weeks gestation of pregnancy: Secondary | ICD-10-CM

## 2019-11-19 ENCOUNTER — Ambulatory Visit (INDEPENDENT_AMBULATORY_CARE_PROVIDER_SITE_OTHER): Payer: 59 | Admitting: Obstetrics and Gynecology

## 2019-11-19 ENCOUNTER — Other Ambulatory Visit: Payer: Self-pay

## 2019-11-19 ENCOUNTER — Encounter: Payer: Self-pay | Admitting: Obstetrics and Gynecology

## 2019-11-19 VITALS — BP 122/76 | HR 91 | Wt 197.0 lb

## 2019-11-19 DIAGNOSIS — Z3480 Encounter for supervision of other normal pregnancy, unspecified trimester: Secondary | ICD-10-CM

## 2019-11-19 DIAGNOSIS — A6004 Herpesviral vulvovaginitis: Secondary | ICD-10-CM

## 2019-11-19 DIAGNOSIS — O99210 Obesity complicating pregnancy, unspecified trimester: Secondary | ICD-10-CM

## 2019-11-19 NOTE — Progress Notes (Signed)
Pt presents for ROB without complaints today.  

## 2019-11-19 NOTE — Progress Notes (Signed)
   PRENATAL VISIT NOTE  Subjective:  Jean Solis is a 24 y.o. G2P1001 at [redacted]w[redacted]d being seen today for ongoing prenatal care.  She is currently monitored for the following issues for this low-risk pregnancy and has Eczema; Obesity affecting pregnancy, antepartum; Vitamin D deficiency; Obesity (BMI 30.0-34.9); Pre-diabetes; Genital herpes; Supervision of other normal pregnancy, antepartum; and Alpha thalassemia silent carrier on their problem list.  Patient reports no complaints.  Contractions: Not present. Vag. Bleeding: None.  Movement: Present. Denies leaking of fluid.   The following portions of the patient's history were reviewed and updated as appropriate: allergies, current medications, past family history, past medical history, past social history, past surgical history and problem list.   Objective:   Vitals:   11/19/19 1440  BP: 122/76  Pulse: 91  Weight: 197 lb (89.4 kg)    Fetal Status: Fetal Heart Rate (bpm): 150 Fundal Height: 24 cm Movement: Present     General:  Alert, oriented and cooperative. Patient is in no acute distress.  Skin: Skin is warm and dry. No rash noted.   Cardiovascular: Normal heart rate noted  Respiratory: Normal respiratory effort, no problems with respiration noted  Abdomen: Soft, gravid, appropriate for gestational age.  Pain/Pressure: Present     Pelvic: Cervical exam deferred        Extremities: Normal range of motion.  Edema: None  Mental Status: Normal mood and affect. Normal behavior. Normal judgment and thought content.   Assessment and Plan:  Pregnancy: G2P1001 at [redacted]w[redacted]d 1. Supervision of other normal pregnancy, antepartum Patient is doing well without complaints Third trimester labs and glucola next visit Patient seen by Ortho recently who recommended minimal walking at work  2. Herpes simplex vulvovaginitis Prophylaxis at 36 weeks  3. Obesity affecting pregnancy, antepartum   Preterm labor symptoms and general obstetric  precautions including but not limited to vaginal bleeding, contractions, leaking of fluid and fetal movement were reviewed in detail with the patient. Please refer to After Visit Summary for other counseling recommendations.   Return in about 4 weeks (around 12/17/2019) for in person, ROB, 2 hr glucola next visit, Low risk.  No future appointments.  Catalina Antigua, MD

## 2019-11-21 ENCOUNTER — Telehealth: Payer: Self-pay | Admitting: Family Medicine

## 2019-11-21 NOTE — Telephone Encounter (Signed)
Patient called requesting a call back from Terri. Patient is stating that her job is giving her a hard time about her doctor's note from Dr. Ramond Dial. Patient asked for a call back to explain. Patient phone number is [phone].

## 2019-11-21 NOTE — Telephone Encounter (Signed)
I spoke with the patient. A new work note was written with more specific information regarding the walking restriction. Mailed the signed copy to her home address per request.

## 2019-11-21 NOTE — Telephone Encounter (Signed)
I called and left voice mail to either call me back, or she can send a detailed message through MyChart.

## 2019-12-17 ENCOUNTER — Other Ambulatory Visit: Payer: Self-pay

## 2019-12-17 ENCOUNTER — Other Ambulatory Visit: Payer: 59

## 2019-12-17 ENCOUNTER — Ambulatory Visit (INDEPENDENT_AMBULATORY_CARE_PROVIDER_SITE_OTHER): Payer: 59 | Admitting: Advanced Practice Midwife

## 2019-12-17 ENCOUNTER — Encounter: Payer: Self-pay | Admitting: Advanced Practice Midwife

## 2019-12-17 VITALS — BP 121/70 | HR 91 | Wt 197.8 lb

## 2019-12-17 DIAGNOSIS — Z3A27 27 weeks gestation of pregnancy: Secondary | ICD-10-CM

## 2019-12-17 DIAGNOSIS — Z3480 Encounter for supervision of other normal pregnancy, unspecified trimester: Secondary | ICD-10-CM

## 2019-12-17 DIAGNOSIS — L2082 Flexural eczema: Secondary | ICD-10-CM

## 2019-12-17 MED ORDER — TRIAMCINOLONE ACETONIDE 0.1 % EX CREA: TOPICAL_CREAM | CUTANEOUS | 0 refills | Status: DC

## 2019-12-17 MED ORDER — COMFORT FIT MATERNITY SUPP MED MISC: 0 refills | Status: DC

## 2019-12-17 NOTE — Progress Notes (Signed)
   PRENATAL VISIT NOTE  Subjective:  Jean Solis is a 24 y.o. G2P1001 at 106w5d being seen today for ongoing prenatal care.  She is currently monitored for the following issues for this low-risk pregnancy and has Eczema; Obesity affecting pregnancy, antepartum; Vitamin D deficiency; Obesity (BMI 30.0-34.9); Pre-diabetes; Genital herpes; Supervision of other normal pregnancy, antepartum; and Alpha thalassemia silent carrier on their problem list.  Patient reports eczema of arms, legs, neck/ torso.  Contractions: Not present. Vag. Bleeding: None.  Movement: Present. Denies leaking of fluid.   The following portions of the patient's history were reviewed and updated as appropriate: allergies, current medications, past family history, past medical history, past social history, past surgical history and problem list.   Objective:   Vitals:   12/17/19 0954  BP: 121/70  Pulse: 91  Weight: 197 lb 12.8 oz (89.7 kg)    Fetal Status:     Movement: Present     General:  Alert, oriented and cooperative. Patient is in no acute distress.  Skin: Skin is warm and dry. No rash noted.   Cardiovascular: Normal heart rate noted  Respiratory: Normal respiratory effort, no problems with respiration noted  Abdomen: Soft, gravid, appropriate for gestational age.  Pain/Pressure: Present     Pelvic: Cervical exam deferred        Extremities: Normal range of motion.  Edema: None  Mental Status: Normal mood and affect. Normal behavior. Normal judgment and thought content.   Assessment and Plan:  Pregnancy: G2P1001 at [redacted]w[redacted]d 1. Supervision of other normal pregnancy, antepartum --Anticipatory guidance about next visits/weeks of pregnancy given. --Pt took waterbirth class last pregnancy but had PROM, got in tub but labor was slow and was augmented with Pitocin so could not deliver in the water.  Low risk pregnancy with this pregnancy.  Pt interested, will continue to discuss waterbirth. --Next visit in 2 weeks  in office  2. [redacted] weeks gestation of pregnancy  - Flu Vaccine QUAD 36+ mos IM - Glucose Tolerance, 2 Hours w/1 Hour - HIV Antibody (routine testing w rflx) - RPR - CBC  3. Flexural eczema --Pt trying emollient creams, no improvement.  Pt with hx eczema, usually mild but had flare up in last pregnancy also.  Discussed using perfume free soaps/detergents.  Start topical steroid BID x 2 weeks, also use emollient cream daily. - triamcinolone (KENALOG) 0.1 %; Use twice per day for 14 days.  Use an emollient cream/lotion on top of or under the steroid cream also.  Dispense: 30 g; Refill: 0  Preterm labor symptoms and general obstetric precautions including but not limited to vaginal bleeding, contractions, leaking of fluid and fetal movement were reviewed in detail with the patient. Please refer to After Visit Summary for other counseling recommendations.   Return in about 2 weeks (around 12/31/2019).  Future Appointments  Date Time Provider Department Center  01/01/2020  9:55 AM Gerrit Heck, CNM [facility] None    Sharen Counter, CNM

## 2019-12-17 NOTE — Progress Notes (Signed)
Pt present for ROB and 2 gtt labs Tdap offered; pt requests to defer till next visit

## 2019-12-17 NOTE — Patient Instructions (Signed)
Third Trimester of Pregnancy The third trimester is from week 28 through week 40 (months 7 through 9). The third trimester is a time when the unborn baby (fetus) is growing rapidly. At the end of the ninth month, the fetus is about 20 inches in length and weighs 6-10 pounds. Body changes during your third trimester Your body will continue to go through many changes during pregnancy. The changes vary from woman to woman. During the third trimester:  Your weight will continue to increase. You can expect to gain 25-35 pounds (11-16 kg) by the end of the pregnancy.  You may begin to get stretch marks on your hips, abdomen, and breasts.  You may urinate more often because the fetus is moving lower into your pelvis and pressing on your bladder.  You may develop or continue to have heartburn. This is caused by increased hormones that slow down muscles in the digestive tract.  You may develop or continue to have constipation because increased hormones slow digestion and cause the muscles that push waste through your intestines to relax.  You may develop hemorrhoids. These are swollen veins (varicose veins) in the rectum that can itch or be painful.  You may develop swollen, bulging veins (varicose veins) in your legs.  You may have increased body aches in the pelvis, back, or thighs. This is due to weight gain and increased hormones that are relaxing your joints.  You may have changes in your hair. These can include thickening of your hair, rapid growth, and changes in texture. Some women also have hair loss during or after pregnancy, or hair that feels dry or thin. Your hair will most likely return to normal after your baby is born.  Your breasts will continue to grow and they will continue to become tender. A yellow fluid (colostrum) may leak from your breasts. This is the first milk you are producing for your baby.  Your belly button may stick out.  You may notice more swelling in your hands,  face, or ankles.  You may have increased tingling or numbness in your hands, arms, and legs. The skin on your belly may also feel numb.  You may feel short of breath because of your expanding uterus.  You may have more problems sleeping. This can be caused by the size of your belly, increased need to urinate, and an increase in your body's metabolism.  You may notice the fetus "dropping," or moving lower in your abdomen (lightening).  You may have increased vaginal discharge.  You may notice your joints feel loose and you may have pain around your pelvic bone. What to expect at prenatal visits You will have prenatal exams every 2 weeks until week 36. Then you will have weekly prenatal exams. During a routine prenatal visit:  You will be weighed to make sure you and the baby are growing normally.  Your blood pressure will be taken.  Your abdomen will be measured to track your baby's growth.  The fetal heartbeat will be listened to.  Any test results from the previous visit will be discussed.  You may have a cervical check near your due date to see if your cervix has softened or thinned (effaced).  You will be tested for Group B streptococcus. This happens between 35 and 37 weeks. Your health care provider may ask you:  What your birth plan is.  How you are feeling.  If you are feeling the baby move.  If you have had any abnormal   symptoms, such as leaking fluid, bleeding, severe headaches, or abdominal cramping.  If you are using any tobacco products, including cigarettes, chewing tobacco, and electronic cigarettes.  If you have any questions. Other tests or screenings that may be performed during your third trimester include:  Blood tests that check for low iron levels (anemia).  Fetal testing to check the health, activity level, and growth of the fetus. Testing is done if you have certain medical conditions or if there are problems during the pregnancy.  Nonstress test  (NST). This test checks the health of your baby to make sure there are no signs of problems, such as the baby not getting enough oxygen. During this test, a belt is placed around your belly. The baby is made to move, and its heart rate is monitored during movement. What is false labor? False labor is a condition in which you feel small, irregular tightenings of the muscles in the womb (contractions) that usually go away with rest, changing position, or drinking water. These are called Braxton Hicks contractions. Contractions may last for hours, days, or even weeks before true labor sets in. If contractions come at regular intervals, become more frequent, increase in intensity, or become painful, you should see your health care provider. What are the signs of labor?  Abdominal cramps.  Regular contractions that start at 10 minutes apart and become stronger and more frequent with time.  Contractions that start on the top of the uterus and spread down to the lower abdomen and back.  Increased pelvic pressure and dull back pain.  A watery or bloody mucus discharge that comes from the vagina.  Leaking of amniotic fluid. This is also known as your "water breaking." It could be a slow trickle or a gush. Let your health care provider know if it has a color or strange odor. If you have any of these signs, call your health care provider right away, even if it is before your due date. Follow these instructions at home: Medicines  Follow your health care provider's instructions regarding medicine use. Specific medicines may be either safe or unsafe to take during pregnancy.  Take a prenatal vitamin that contains at least 600 micrograms (mcg) of folic acid.  If you develop constipation, try taking a stool softener if your health care provider approves. Eating and drinking   Eat a balanced diet that includes fresh fruits and vegetables, whole grains, good sources of protein such as meat, eggs, or tofu,  and low-fat dairy. Your health care provider will help you determine the amount of weight gain that is right for you.  Avoid raw meat and uncooked cheese. These carry germs that can cause birth defects in the baby.  If you have low calcium intake from food, talk to your health care provider about whether you should take a daily calcium supplement.  Eat four or five small meals rather than three large meals a day.  Limit foods that are high in fat and processed sugars, such as fried and sweet foods.  To prevent constipation: ? Drink enough fluid to keep your urine clear or pale yellow. ? Eat foods that are high in fiber, such as fresh fruits and vegetables, whole grains, and beans. Activity  Exercise only as directed by your health care provider. Most women can continue their usual exercise routine during pregnancy. Try to exercise for 30 minutes at least 5 days a week. Stop exercising if you experience uterine contractions.  Avoid heavy lifting.  Do   not exercise in extreme heat or humidity, or at high altitudes.  Wear low-heel, comfortable shoes.  Practice good posture.  You may continue to have sex unless your health care provider tells you otherwise. Relieving pain and discomfort  Take frequent breaks and rest with your legs elevated if you have leg cramps or low back pain.  Take warm sitz baths to soothe any pain or discomfort caused by hemorrhoids. Use hemorrhoid cream if your health care provider approves.  Wear a good support bra to prevent discomfort from breast tenderness.  If you develop varicose veins: ? Wear support pantyhose or compression stockings as told by your healthcare provider. ? Elevate your feet for 15 minutes, 3-4 times a day. Prenatal care  Write down your questions. Take them to your prenatal visits.  Keep all your prenatal visits as told by your health care provider. This is important. Safety  Wear your seat belt at all times when driving.  Make  a list of emergency phone numbers, including numbers for family, friends, the hospital, and police and fire departments. General instructions  Avoid cat litter boxes and soil used by cats. These carry germs that can cause birth defects in the baby. If you have a cat, ask someone to clean the litter box for you.  Do not travel far distances unless it is absolutely necessary and only with the approval of your health care provider.  Do not use hot tubs, steam rooms, or saunas.  Do not drink alcohol.  Do not use any products that contain nicotine or tobacco, such as cigarettes and e-cigarettes. If you need help quitting, ask your health care provider.  Do not use any medicinal herbs or unprescribed drugs. These chemicals affect the formation and growth of the baby.  Do not douche or use tampons or scented sanitary pads.  Do not cross your legs for long periods of time.  To prepare for the arrival of your baby: ? Take prenatal classes to understand, practice, and ask questions about labor and delivery. ? Make a trial run to the hospital. ? Visit the hospital and tour the maternity area. ? Arrange for maternity or paternity leave through employers. ? Arrange for family and friends to take care of pets while you are in the hospital. ? Purchase a rear-facing car seat and make sure you know how to install it in your car. ? Pack your hospital bag. ? Prepare the baby's nursery. Make sure to remove all pillows and stuffed animals from the baby's crib to prevent suffocation.  Visit your dentist if you have not gone during your pregnancy. Use a soft toothbrush to brush your teeth and be gentle when you floss. Contact a health care provider if:  You are unsure if you are in labor or if your water has broken.  You become dizzy.  You have mild pelvic cramps, pelvic pressure, or nagging pain in your abdominal area.  You have lower back pain.  You have persistent nausea, vomiting, or  diarrhea.  You have an unusual or bad smelling vaginal discharge.  You have pain when you urinate. Get help right away if:  Your water breaks before 37 weeks.  You have regular contractions less than 5 minutes apart before 37 weeks.  You have a fever.  You are leaking fluid from your vagina.  You have spotting or bleeding from your vagina.  You have severe abdominal pain or cramping.  You have rapid weight loss or weight gain.  You have   shortness of breath with chest pain.  You notice sudden or extreme swelling of your face, hands, ankles, feet, or legs.  Your baby makes fewer than 10 movements in 2 hours.  You have severe headaches that do not go away when you take medicine.  You have vision changes. Summary  The third trimester is from week 28 through week 40, months 7 through 9. The third trimester is a time when the unborn baby (fetus) is growing rapidly.  During the third trimester, your discomfort may increase as you and your baby continue to gain weight. You may have abdominal, leg, and back pain, sleeping problems, and an increased need to urinate.  During the third trimester your breasts will keep growing and they will continue to become tender. A yellow fluid (colostrum) may leak from your breasts. This is the first milk you are producing for your baby.  False labor is a condition in which you feel small, irregular tightenings of the muscles in the womb (contractions) that eventually go away. These are called Braxton Hicks contractions. Contractions may last for hours, days, or even weeks before true labor sets in.  Signs of labor can include: abdominal cramps; regular contractions that start at 10 minutes apart and become stronger and more frequent with time; watery or bloody mucus discharge that comes from the vagina; increased pelvic pressure and dull back pain; and leaking of amniotic fluid. This information is not intended to replace advice given to you by your  health care provider. Make sure you discuss any questions you have with your health care provider. Document Revised: 04/20/2018 Document Reviewed: 02/03/2016 Elsevier Patient Education  2020 [employer]  

## 2019-12-18 LAB — GLUCOSE TOLERANCE, 2 HOURS W/ 1HR
Glucose, 1 hour: 71 mg/dL (ref 65–179)
Glucose, 2 hour: 74 mg/dL (ref 65–152)
Glucose, Fasting: 75 mg/dL (ref 65–91)

## 2019-12-18 LAB — CBC
Hematocrit: 34.9 % (ref 34.0–46.6)
Hemoglobin: 12 g/dL (ref 11.1–15.9)
MCH: 28.1 pg (ref 26.6–33.0)
MCHC: 34.4 g/dL (ref 31.5–35.7)
MCV: 82 fL (ref 79–97)
Platelets: 235 10*3/uL (ref 150–450)
RBC: 4.27 x10E6/uL (ref 3.77–5.28)
RDW: 14 % (ref 11.7–15.4)
WBC: 6.1 10*3/uL (ref 3.4–10.8)

## 2019-12-18 LAB — HIV ANTIBODY (ROUTINE TESTING W REFLEX): HIV Screen 4th Generation wRfx: NONREACTIVE

## 2019-12-18 LAB — RPR: RPR Ser Ql: NONREACTIVE

## 2020-01-01 ENCOUNTER — Other Ambulatory Visit: Payer: Self-pay

## 2020-01-01 ENCOUNTER — Ambulatory Visit (INDEPENDENT_AMBULATORY_CARE_PROVIDER_SITE_OTHER): Payer: 59

## 2020-01-01 VITALS — BP 121/72 | HR 88 | Wt 197.6 lb

## 2020-01-01 DIAGNOSIS — Z3480 Encounter for supervision of other normal pregnancy, unspecified trimester: Secondary | ICD-10-CM

## 2020-01-01 DIAGNOSIS — L2082 Flexural eczema: Secondary | ICD-10-CM

## 2020-01-01 DIAGNOSIS — Z23 Encounter for immunization: Secondary | ICD-10-CM

## 2020-01-01 DIAGNOSIS — Z3A29 29 weeks gestation of pregnancy: Secondary | ICD-10-CM

## 2020-01-01 NOTE — Progress Notes (Signed)
   LOW-RISK PREGNANCY OFFICE VISIT  [name] MRN [id number]  Date of birth: March 06, 1995 Chief Complaint:   Routine Prenatal Visit  Subjective:   Jean Solis is a 24 y.o. G2P1001 female at [redacted]w[redacted]d with an Estimated Date of Delivery: 03/12/20 being seen today for ongoing management of a low-risk pregnancy aeb has Eczema; Obesity affecting pregnancy, antepartum; Vitamin D deficiency; Obesity (BMI 30.0-34.9); Pre-diabetes; Genital herpes; Supervision of other normal pregnancy, antepartum; and Alpha thalassemia silent carrier on their problem list.  Patient presents today without pregnancy related complaints.  She states her eczema has been flaring and she was unaware of recent prescription sent to pharmacy.  Patient also expresses desire for waterbirth. Patient endorses fetal movement and denies vaginal concerns including abnormal discharge, leaking of fluid, and bleeding.  Contractions: Not present. Vag. Bleeding: None.  Movement: Present.  Reviewed past medical,surgical, social, obstetrical and family history as well as problem list, medications and allergies.  Objective   Vitals:   01/01/20 1007  BP: 121/72  Pulse: 88  Weight: 197 lb 9.6 oz (89.6 kg)  Body mass index is 32.88 kg/m.  Total Weight Gain:19 lb 9.6 oz (8.891 kg)         Physical Examination:   General appearance: Well appearing, and in no distress  Mental status: Alert, oriented to person, place, and time  Skin: Warm & dry  Cardiovascular: Normal heart rate noted  Respiratory: Normal respiratory effort, no distress  Abdomen: Soft, gravid, nontender, AGA with Fundal height of Fundal Height: 29 cm  Pelvic: Cervical exam deferred           Extremities: Edema: Trace  Fetal Status: Fetal Heart Rate (bpm): 140  Movement: Present   No results found for this or any previous visit (from the past 24 hour(s)).  Assessment & Plan:  Low-risk pregnancy of a 24 y.o., G2P1001 at [redacted]w[redacted]d with an Estimated Date of  Delivery: 03/12/20   1. Supervision of other normal pregnancy, antepartum -Anticipatory guidance for upcoming visits. -Reviewed GTT and normal results. -Discussed waterbirth risks and consents signed. -Reiterated contraindications and how WB is an option and not a guarantee. -Informed that repeat WB course probably not necessary since taken in 2019.  2. [redacted] weeks gestation of pregnancy -Doing well overall -Received TDap -Discussed receiving 2nd Covid.  Encouraged to get after holidays increase symptoms/reaction arise.   3. Flexural eczema -Discussed usage of Kenalog cream that was previous sent. -Suggested initiation of antihistamine such as claritin or zyrtec. -Encouraged proper hydration    Meds: No orders of the defined types were placed in this encounter.  Labs/procedures today:  Lab Orders  No laboratory test(s) ordered today     Reviewed: Preterm labor symptoms and general obstetric precautions including but not limited to vaginal bleeding, contractions, leaking of fluid and fetal movement were reviewed in detail with the patient.  All questions were answered.  Follow-up: Return in about 3 weeks (around 01/22/2020) for Virtual LR-ROB.  Orders Placed This Encounter  Procedures  . Tdap vaccine greater than or equal to 7yo IM   Cherre Robins MSN, CNM 01/01/2020

## 2020-01-01 NOTE — Progress Notes (Signed)
Pt reports fetal movement with occasional pressure.  

## 2020-01-12 NOTE — L&D Delivery Note (Signed)
Delivery Note  Jean Solis is a 25 y.o. G2P1001 s/p vaginal delivery at [redacted]w[redacted]d.  She was admitted for eIOL.   ROM: 3h 97m with clear stained fluid GBS Status: positive, PCN >4hrs PTD Maximum Maternal Temperature: 98.3F   Labor Progress: Patient in latent labor on admission after FB placed as an outpatient.  She continued to progress well without augmentation. Pt was noted to have complete cervical dilation and she then delivered without complication as noted below.   Delivery Date/Time: 03/07/2020 at 0847 Delivery: Called to room and patient was complete and pushing. Head delivered LOA. No nuchal cord present. Shoulder and body delivered in usual fashion. Infant with spontaneous cry, placed on mother's abdomen, dried and stimulated. Cord clamped x 2 after 1-minute delay, and cut by FOB under my direct supervision. Cord blood drawn. Placenta delivered spontaneously with gentle cord traction. Fundus firm with massage and Pitocin. Labia, perineum, vagina, and cervix were inspected; no lacerations visualized.    Placenta: intact, 3-vessel cord, sent to L&D Complications: none Lacerations: none EBL: 52 ml Analgesia: epidural   Infant: viable female  APGARs 9 & 9  weight per medical record   Maury Dus, MD PGY-1 Family Medicine Resident

## 2020-01-22 ENCOUNTER — Other Ambulatory Visit: Payer: Self-pay

## 2020-01-22 ENCOUNTER — Telehealth (INDEPENDENT_AMBULATORY_CARE_PROVIDER_SITE_OTHER): Payer: BLUE CROSS/BLUE SHIELD | Admitting: Advanced Practice Midwife

## 2020-01-22 VITALS — BP 119/84 | HR 89

## 2020-01-22 DIAGNOSIS — Z3483 Encounter for supervision of other normal pregnancy, third trimester: Secondary | ICD-10-CM

## 2020-01-22 DIAGNOSIS — Z3480 Encounter for supervision of other normal pregnancy, unspecified trimester: Secondary | ICD-10-CM

## 2020-01-22 DIAGNOSIS — Z3A32 32 weeks gestation of pregnancy: Secondary | ICD-10-CM

## 2020-01-22 NOTE — Progress Notes (Signed)
   OBSTETRICS PRENATAL VIRTUAL VISIT ENCOUNTER NOTE  Provider location: [facility] [place]   I connected with Jean Solis on 01/22/20 at  1:20 PM EST by MyChart Video Encounter at home and verified that I am speaking with the correct person using two identifiers.   I discussed the limitations, risks, security and privacy concerns of performing an evaluation and management service virtually and the availability of in person appointments. I also discussed with the patient that there may be a patient responsible charge related to this service. The patient expressed understanding and agreed to proceed. Subjective:  Jean Solis is a 25 y.o. G2P1001 at [redacted]w[redacted]d being seen today for ongoing prenatal care.  She is currently monitored for the following issues for this low-risk pregnancy and has Eczema; Vitamin D deficiency; Pre-diabetes; Genital herpes; Supervision of other normal pregnancy, antepartum; and Alpha thalassemia silent carrier on their problem list.  Patient reports no complaints.  Contractions: Not present. Vag. Bleeding: None.  Movement: Present. Denies any leaking of fluid.   The following portions of the patient's history were reviewed and updated as appropriate: allergies, current medications, past family history, past medical history, past social history, past surgical history and problem list.   Objective:   Vitals:   01/22/20 1337  BP: 119/84  Pulse: 89    Fetal Status:     Movement: Present     General:  Alert, oriented and cooperative. Patient is in no acute distress.  Respiratory: Normal respiratory effort, no problems with respiration noted  Mental Status: Normal mood and affect. Normal behavior. Normal judgment and thought content.  Rest of physical exam deferred due to type of encounter  Imaging: No results found.  Assessment and Plan:  Pregnancy: G2P1001 at [redacted]w[redacted]d 1. Supervision of other normal pregnancy, antepartum --Pt reports good fetal  movement, denies cramping, LOF, or vaginal bleeding --Anticipatory guidance about next visits/weeks of pregnancy given. --Next visit in 3 weeks in office for GBS/GC  2. [redacted] weeks gestation of pregnancy   Preterm labor symptoms and general obstetric precautions including but not limited to vaginal bleeding, contractions, leaking of fluid and fetal movement were reviewed in detail with the patient. I discussed the assessment and treatment plan with the patient. The patient was provided an opportunity to ask questions and all were answered. The patient agreed with the plan and demonstrated an understanding of the instructions. The patient was advised to call back or seek an in-person office evaluation/go to [facility] at [facility] for any urgent or concerning symptoms. Please refer to After Visit Summary for other counseling recommendations.   I provided 10 minutes of face-to-face time during this encounter.  No follow-ups on file.  No future appointments.  Sharen Counter, CNM [facility] for [employer], [facility]

## 2020-02-13 ENCOUNTER — Ambulatory Visit (INDEPENDENT_AMBULATORY_CARE_PROVIDER_SITE_OTHER): Payer: BLUE CROSS/BLUE SHIELD | Admitting: Obstetrics and Gynecology

## 2020-02-13 ENCOUNTER — Other Ambulatory Visit: Payer: Self-pay

## 2020-02-13 ENCOUNTER — Other Ambulatory Visit (HOSPITAL_COMMUNITY)
Admission: RE | Admit: 2020-02-13 | Discharge: 2020-02-13 | Disposition: A | Discharge status: Home / Self Care | Payer: BLUE CROSS/BLUE SHIELD | Source: Ambulatory Visit | Attending: Obstetrics and Gynecology | Admitting: Obstetrics and Gynecology

## 2020-02-13 DIAGNOSIS — Z3480 Encounter for supervision of other normal pregnancy, unspecified trimester: Secondary | ICD-10-CM | POA: Insufficient documentation

## 2020-02-13 DIAGNOSIS — Z3A36 36 weeks gestation of pregnancy: Secondary | ICD-10-CM | POA: Diagnosis not present

## 2020-02-13 DIAGNOSIS — O23593 Infection of other part of genital tract in pregnancy, third trimester: Secondary | ICD-10-CM | POA: Diagnosis not present

## 2020-02-13 DIAGNOSIS — B373 Candidiasis of vulva and vagina: Secondary | ICD-10-CM | POA: Insufficient documentation

## 2020-02-13 DIAGNOSIS — Z3483 Encounter for supervision of other normal pregnancy, third trimester: Secondary | ICD-10-CM | POA: Diagnosis present

## 2020-02-13 NOTE — Progress Notes (Signed)
   PRENATAL VISIT NOTE  Subjective:  Jean Solis is a 25 y.o. G2P1001 at [redacted]w[redacted]d being seen today for ongoing prenatal care.  She is currently monitored for the following issues for this low-risk pregnancy and has Eczema; Vitamin D deficiency; Pre-diabetes; Genital herpes; Supervision of other normal pregnancy, antepartum; and Alpha thalassemia silent carrier on their problem list.  Patient reports no complaints.  Contractions: Not present. Vag. Bleeding: None.  Movement: Present. Denies leaking of fluid. Odor to discharge reports.   The following portions of the patient's history were reviewed and updated as appropriate: allergies, current medications, past family history, past medical history, past social history, past surgical history and problem list.   Objective:   Vitals:   02/13/20 1114  BP: 126/79  Pulse: (!) 105  Weight: 202 lb 3.2 oz (91.7 kg)    Fetal Status: Fetal Heart Rate (bpm): 154   Movement: Present     General:  Alert, oriented and cooperative. Patient is in no acute distress.  Skin: Skin is warm and dry. No rash noted.   Cardiovascular: Normal heart rate noted  Respiratory: Normal respiratory effort, no problems with respiration noted  Abdomen: Soft, gravid, appropriate for gestational age.  Pain/Pressure: Absent     Pelvic: Cervical exam performed in the presence of a chaperone        Extremities: Normal range of motion.  Edema: None  Mental Status: Normal mood and affect. Normal behavior. Normal judgment and thought content.   Assessment and Plan:  Pregnancy: G2P1001 at [redacted]w[redacted]d 1. Supervision of other normal pregnancy, antepartum  - Cervicovaginal ancillary only( [place]) - Culture, beta strep (group b only) - Doing well - discussed labor and precautions - Continue Valtrex   Preterm labor symptoms and general obstetric precautions including but not limited to vaginal bleeding, contractions, leaking of fluid and fetal movement were reviewed in detail  with the patient. Please refer to After Visit Summary for other counseling recommendations.   No follow-ups on file.  Future Appointments  Date Time Provider Department Center  02/20/2020  2:20 PM Nugent, Odie Sera, NP [facility] None    Venia Carbon, NP

## 2020-02-14 LAB — CERVICOVAGINAL ANCILLARY ONLY
Bacterial Vaginitis (gardnerella): NEGATIVE
Candida Glabrata: NEGATIVE
Candida Vaginitis: POSITIVE — AB
Chlamydia: NEGATIVE
Comment: NEGATIVE
Comment: NEGATIVE
Comment: NEGATIVE
Comment: NEGATIVE
Comment: NORMAL
Neisseria Gonorrhea: NEGATIVE

## 2020-02-16 LAB — CULTURE, BETA STREP (GROUP B ONLY): Strep Gp B Culture: POSITIVE — AB

## 2020-02-18 ENCOUNTER — Other Ambulatory Visit: Payer: Self-pay

## 2020-02-18 DIAGNOSIS — L2082 Flexural eczema: Secondary | ICD-10-CM

## 2020-02-19 ENCOUNTER — Other Ambulatory Visit: Payer: Self-pay

## 2020-02-19 ENCOUNTER — Encounter: Payer: Self-pay | Admitting: Obstetrics and Gynecology

## 2020-02-19 DIAGNOSIS — O99820 Streptococcus B carrier state complicating pregnancy: Secondary | ICD-10-CM | POA: Insufficient documentation

## 2020-02-19 MED ORDER — FLUCONAZOLE 150 MG PO TABS: 150.0000 mg | ORAL_TABLET | Freq: Once | ORAL | 0 refills | Status: AC

## 2020-02-19 NOTE — Telephone Encounter (Signed)
Yeast was pick up on her lab culture and patient does desire to be treated. Per Misty Stanley of to send in diflucan tablet  for her to take.

## 2020-02-20 ENCOUNTER — Ambulatory Visit (INDEPENDENT_AMBULATORY_CARE_PROVIDER_SITE_OTHER): Payer: BLUE CROSS/BLUE SHIELD | Admitting: Women's Health

## 2020-02-20 ENCOUNTER — Other Ambulatory Visit: Payer: Self-pay

## 2020-02-20 VITALS — BP 118/70 | HR 105 | Wt 204.0 lb

## 2020-02-20 DIAGNOSIS — D563 Thalassemia minor: Secondary | ICD-10-CM

## 2020-02-20 DIAGNOSIS — O99820 Streptococcus B carrier state complicating pregnancy: Secondary | ICD-10-CM

## 2020-02-20 DIAGNOSIS — Z3480 Encounter for supervision of other normal pregnancy, unspecified trimester: Secondary | ICD-10-CM

## 2020-02-20 DIAGNOSIS — A6004 Herpesviral vulvovaginitis: Secondary | ICD-10-CM

## 2020-02-20 DIAGNOSIS — O26843 Uterine size-date discrepancy, third trimester: Secondary | ICD-10-CM

## 2020-02-20 NOTE — Progress Notes (Addendum)
Subjective:  Jean Solis is a 25 y.o. G2P1001 at [redacted]w[redacted]d being seen today for ongoing prenatal care.  She is currently monitored for the following issues for this low-risk pregnancy and has Eczema; Vitamin D deficiency; Pre-diabetes; Genital herpes; Supervision of other normal pregnancy, antepartum; Alpha thalassemia silent carrier; and GBS (group B Streptococcus carrier), +RV culture, currently pregnant on their problem list.  Patient reports no complaints.  Contractions: Irritability. Vag. Bleeding: None.  Movement: Present. Denies leaking of fluid.   The following portions of the patient's history were reviewed and updated as appropriate: allergies, current medications, past family history, past medical history, past social history, past surgical history and problem list. Problem list updated.  Objective:   Vitals:   02/20/20 1437  BP: 118/70  Pulse: (!) 105  Weight: 204 lb (92.5 kg)    Fetal Status: Fetal Heart Rate (bpm): 145 Fundal Height: 43 cm Movement: Present     General:  Alert, oriented and cooperative. Patient is in no acute distress.  Skin: Skin is warm and dry. No rash noted.   Cardiovascular: Normal heart rate noted  Respiratory: Normal respiratory effort, no problems with respiration noted  Abdomen: Soft, gravid, appropriate for gestational age. Pain/Pressure: Present     Pelvic: Vag. Bleeding: None     Cervical exam performed Dilation: Closed Effacement (%): Thick Station: Ballotable  Extremities: Normal range of motion.  Edema: None  Mental Status: Normal mood and affect. Normal behavior. Normal judgment and thought content.   Urinalysis:      Assessment and Plan:  Pregnancy: G2P1001 at [redacted]w[redacted]d  1. Supervision of other normal pregnancy, antepartum  2. Alpha thalassemia silent carrier -GC completed  3. GBS (group B Streptococcus carrier), +RV culture, currently pregnant -treat in labor  4. Herpes simplex vulvovaginitis -pt taking suppressive treatment  5.  Uterine size-date discrepancy in third trimester - FH 37 - [place] MFM OB FOLLOW UP; Future   Term labor symptoms and general obstetric precautions including but not limited to vaginal bleeding, contractions, leaking of fluid and fetal movement were reviewed in detail with the patient. I discussed the assessment and treatment plan with the patient. The patient was provided an opportunity to ask questions and all were answered. The patient agreed with the plan and demonstrated an understanding of the instructions. The patient was advised to call back or seek an in-person office evaluation/go to [facility] at [facility] for any urgent or concerning symptoms. Please refer to After Visit Summary for other counseling recommendations.  Return in about 1 week (around 02/27/2020) for in-person LOB/MIDWIFE ONLY, needs growth scan ASAP.   Nugent, Odie Sera, NP

## 2020-02-20 NOTE — Patient Instructions (Addendum)
Maternity Assessment Unit (MAU)  The Maternity Assessment Unit (MAU) is located at the [facility] and [place] at [facility]. The address is: [address], [place], [place], [place] [address]. Please see map below for additional directions.    The Maternity Assessment Unit is designed to help you during your pregnancy, and for up to 6 weeks after delivery, with any pregnancy- or postpartum-related emergencies, if you think you are in labor, or if your water has broken. For example, if you experience nausea and vomiting, vaginal bleeding, severe abdominal or pelvic pain, elevated blood pressure or other problems related to your pregnancy or postpartum time, please come to the Maternity Assessment Unit for assistance.        Signs and Symptoms of Labor Labor is the body's natural process of moving the baby and the placenta out of the uterus. The process of labor usually starts when the baby is full-term, between 23 and 40 weeks of pregnancy. Signs and symptoms that you are close to going into labor As your body prepares for labor and the birth of your baby, you may notice the following symptoms in the weeks and days before true labor starts:  Passing a small amount of thick, bloody mucus from your vagina. This is called normal bloody show or losing your mucus plug. This may happen more than a week before labor begins, or right before labor begins, as the opening of the cervix starts to widen (dilate). For some women, the entire mucus plug passes at once. For others, pieces of the mucus plug may gradually pass over several days.  Your baby moving (dropping) lower in your pelvis to get into position for birth (lightening). When this happens, you may feel more pressure on your bladder and pelvic bone and less pressure on your ribs. This may make it easier to breathe. It may also cause you to need to urinate more often and have problems with bowel movements.  Having "practice  contractions," also called Braxton Hicks contractions or false labor. These occur at irregular (unevenly spaced) intervals that are more than 10 minutes apart. False labor contractions are common after exercise or sexual activity. They will stop if you change position, rest, or drink fluids. These contractions are usually mild and do not get stronger over time. They may feel like: ? A backache or back pain. ? Mild cramps, similar to menstrual cramps. ? Tightening or pressure in your abdomen. Other early symptoms include:  Nausea or loss of appetite.  Diarrhea.  Having a sudden burst of energy, or feeling very tired.  Mood changes.  Having trouble sleeping.   Signs and symptoms that labor has begun Signs that you are in labor may include:  Having contractions that come at regular (evenly spaced) intervals and increase in intensity. This may feel like more intense tightening or pressure in your abdomen that moves to your back. ? Contractions may also feel like rhythmic pain in your upper thighs or back that comes and goes at regular intervals. ? For first-time mothers, this change in intensity of contractions often occurs at a more gradual pace. ? Women who have given birth before may notice a more rapid progression of contraction changes.  Feeling pressure in the vaginal area.  Your water breaking (rupture of membranes). This is when the sac of fluid that surrounds your baby breaks. Fluid leaking from your vagina may be clear or blood-tinged. Labor usually starts within 24 hours of your water breaking, but it  may take longer to begin. ? Some women may feel a sudden gush of fluid. ? Others notice that their underwear repeatedly becomes damp. Follow these instructions at home:  When labor starts, or if your water breaks, call your health care provider or nurse care line. Based on your situation, they will determine when you should go in for an exam.  During early labor, you may be able  to rest and manage symptoms at home. Some strategies to try at home include: ? Breathing and relaxation techniques. ? Taking a warm bath or shower. ? Listening to music. ? Using a heating pad on the lower back for pain. If you are directed to use heat:  Place a towel between your skin and the heat source.  Leave the heat on for 20-30 minutes.  Remove the heat if your skin turns bright red. This is especially important if you are unable to feel pain, heat, or cold. You may have a greater risk of getting burned.   Contact a health care provider if:  Your labor has started.  Your water breaks. Get help right away if:  You have painful, regular contractions that are 5 minutes apart or less.  Labor starts before you are [redacted] weeks along in your pregnancy.  You have a fever.  You have bright red blood coming from your vagina.  You do not feel your baby moving.  You have a severe headache with or without vision problems.  You have severe nausea, vomiting, or diarrhea.  You have chest pain or shortness of breath. These symptoms may represent a serious problem that is an emergency. Do not wait to see if the symptoms will go away. Get medical help right away. Call your local emergency services (911 in the [place] Do not drive yourself to the hospital. Summary  Labor is your body's natural process of moving your baby and the placenta out of your uterus.  The process of labor usually starts when your baby is full-term, between 82 and 40 weeks of pregnancy.  When labor starts, or if your water breaks, call your health care provider or nurse care line. Based on your situation, they will determine when you should go in for an exam. This information is not intended to replace advice given to you by your health care provider. Make sure you discuss any questions you have with your health care provider. Document Revised: 10/20/2019 Document Reviewed: 10/20/2019 Elsevier Patient Education  2021  Elsevier Inc.        Rosen's Emergency Medicine: Concepts and Clinical Practice (9th ed., pp. 2296- 2312). Elsevier.">  Braxton Hicks Contractions Contractions of the uterus can occur throughout pregnancy, but they are not always a sign that you are in labor. You may have practice contractions called Braxton Hicks contractions. These false labor contractions are sometimes confused with true labor. What are Deberah Pelton contractions? Braxton Hicks contractions are tightening movements that occur in the muscles of the uterus before labor. Unlike true labor contractions, these contractions do not result in opening (dilation) and thinning of the cervix. Toward the end of pregnancy (32-34 weeks), Braxton Hicks contractions can happen more often and may become stronger. These contractions are sometimes difficult to tell apart from true labor because they can be very uncomfortable. You should not feel embarrassed if you go to the hospital with false labor. Sometimes, the only way to tell if you are in true labor is for your health care provider to look for changes in the  cervix. The health care provider will do a physical exam and may monitor your contractions. If you are not in true labor, the exam should show that your cervix is not dilating and your water has not broken. If there are no other health problems associated with your pregnancy, it is completely safe for you to be sent home with false labor. You may continue to have Braxton Hicks contractions until you go into true labor. How to tell the difference between true labor and false labor True labor  Contractions last 30-70 seconds.  Contractions become very regular.  Discomfort is usually felt in the top of the uterus, and it spreads to the lower abdomen and low back.  Contractions do not go away with walking.  Contractions usually become more intense and increase in frequency.  The cervix dilates and gets thinner. False  labor  Contractions are usually shorter and not as strong as true labor contractions.  Contractions are usually irregular.  Contractions are often felt in the front of the lower abdomen and in the groin.  Contractions may go away when you walk around or change positions while lying down.  Contractions get weaker and are shorter-lasting as time goes on.  The cervix usually does not dilate or become thin. Follow these instructions at home:  Take over-the-counter and prescription medicines only as told by your health care provider.  Keep up with your usual exercises and follow other instructions from your health care provider.  Eat and drink lightly if you think you are going into labor.  If Braxton Hicks contractions are making you uncomfortable: ? Change your position from lying down or resting to walking, or change from walking to resting. ? Sit and rest in a tub of warm water. ? Drink enough fluid to keep your urine pale yellow. Dehydration may cause these contractions. ? Do slow and deep breathing several times an hour.  Keep all follow-up prenatal visits as told by your health care provider. This is important.   Contact a health care provider if:  You have a fever.  You have continuous pain in your abdomen. Get help right away if:  Your contractions become stronger, more regular, and closer together.  You have fluid leaking or gushing from your vagina.  You pass blood-tinged mucus (bloody show).  You have bleeding from your vagina.  You have low back pain that you never had before.  You feel your baby's head pushing down and causing pelvic pressure.  Your baby is not moving inside you as much as it used to. Summary  Contractions that occur before labor are called Braxton Hicks contractions, false labor, or practice contractions.  Braxton Hicks contractions are usually shorter, weaker, farther apart, and less regular than true labor contractions. True labor  contractions usually become progressively stronger and regular, and they become more frequent.  Manage discomfort from [facility] contractions by changing position, resting in a warm bath, drinking plenty of water, or practicing deep breathing. This information is not intended to replace advice given to you by your health care provider. Make sure you discuss any questions you have with your health care provider. Document Revised: 12/10/2016 Document Reviewed: 05/13/2016 Elsevier Patient Education  2021 Elsevier Inc.        Group B Streptococcus Infection During Pregnancy Group B Streptococcus (GBS) is a type of bacteria that is often found in healthy people. It is commonly found in the rectum, vagina, and intestines. In people who are healthy and not  pregnant, the bacteria rarely cause serious illness or complications. However, women who test positive for GBS during pregnancy can pass the bacteria to the baby during childbirth. This can cause serious infection in the baby after birth. Women with GBS may also have infections during their pregnancy or soon after childbirth. The infections include urinary tract infections (UTIs) or infections of the uterus. GBS also increases a woman's risk of complications during pregnancy, such as early labor or delivery, miscarriage, or stillbirth. Routine testing for GBS is recommended for all pregnant women. What are the causes? This condition is caused by bacteria called Streptococcus agalactiae. What increases the risk? You may have a higher risk for GBS infection during pregnancy if you had one during a past pregnancy. What are the signs or symptoms? In most cases, GBS infection does not cause symptoms in pregnant women. If symptoms exist, they may include:  Labor that starts before the 37th week of pregnancy.  A UTI or bladder infection. This may cause a fever, frequent urination, or pain and burning during urination.  Fever during labor. There  can also be a rapid heartbeat in the mother or baby. Rare but serious symptoms of a GBS infection in women include:  Blood infection (septicemia). This may cause fever, chills, or confusion.  Lung infection (pneumonia). This may cause fever, chills, cough, rapid breathing, chest pain, or difficulty breathing.  Bone, joint, skin, or soft tissue infection. How is this diagnosed? You may be screened for GBS between week 35 and week 37 of pregnancy. If you have symptoms of preterm labor, you may be screened earlier. This condition is diagnosed based on lab test results from:  A swab of fluid from the vagina and rectum.  A urine sample. How is this treated? This condition is treated with antibiotic medicine. Antibiotic medicine may be given:  To you when you go into labor, or as soon as your water breaks. The medicines will continue until after you give birth. If you are having a cesarean delivery, you do not need antibiotics unless your water has broken.  To your baby, if he or she requires treatment. Your health care provider will check your baby to decide if he or she needs antibiotics to prevent a serious infection.   Follow these instructions at home:  Take over-the-counter and prescription medicines only as told by your health care provider.  Take your antibiotic medicine as told by your health care provider. Do not stop taking the antibiotic even if you start to feel better.  Keep all pre-birth (prenatal) visits and follow-up visits as told by your health care provider. This is important. Contact a health care provider if:  You have pain or burning when you urinate.  You have to urinate more often than usual.  You have a fever or chills.  You develop a bad-smelling vaginal discharge. Get help right away if:  Your water breaks.  You go into labor.  You have severe pain in your abdomen.  You have difficulty breathing.  You have chest pain. These symptoms may represent a  serious problem that is an emergency. Do not wait to see if the symptoms will go away. Get medical help right away. Call your local emergency services (911 in the [place] Do not drive yourself to the hospital. Summary  GBS is a type of bacteria that is common in healthy people.  During pregnancy, colonization with GBS can cause serious complications for you or your baby.  Your health care provider  will screen you between 35 and 37 weeks of pregnancy to determine if you are colonized with GBS.  If you are colonized with GBS during pregnancy, your health care provider will recommend antibiotics through an IV during labor.  After delivery, your baby will be evaluated for complications related to potential GBS infection and may require antibiotics to prevent a serious infection. This information is not intended to replace advice given to you by your health care provider. Make sure you discuss any questions you have with your health care provider. Document Revised: 10/30/2019 Document Reviewed: 07/24/2018 Elsevier Patient Education  2021 Elsevier Inc.        Pregnancy and Genital Herpes  Genital herpes is an STI (sexually transmitted infection) that is caused by the herpes simplex virus (HSV). HSV can cause an outbreak of itching, blisters, and sores (ulcers) around the genitals and rectum. Even when the outbreak goes away, the virus stays in the body. If you are pregnant, you can pass HSV to your baby. If you become infected with HSV for the first time while you are pregnant, the virus can cause serious problems for your baby. If you had HSV before your pregnancy, the virus may not affect your baby as seriously. Babies that are infected with HSV are at risk for developing inflammation of the brain (encephalitis), damage to organs, and problems with development. How does this affect me? Your type of delivery may be affected. You may be able to have a vaginal delivery if you have no evidence of  an outbreak when you go into labor. However, your baby may need to be delivered by C-section (cesarean delivery) if you have:  An active, recurrent, or new herpes outbreak at the time of delivery. This is because the virus can pass to your baby through an infected birth canal. This can cause severe problems for your baby.  Any symptoms of infection in the areas around the genitals such as pain, burning, and itching, even if you do not have any ulcers in the birth canal. After delivery, you can breastfeed your baby. The virus will not be present in breast milk. While caring for your baby, you will need to take steps to avoid passing the virus on to your baby. How does this affect my baby? If the virus passes to your baby, it can cause serious problems. The virus can be passed to your baby:  Before delivery. The virus can be passed to your unborn baby through the placenta. This is more likely to happen if you get herpes for the first time in the first 3 months of pregnancy (first trimester). This may cause your baby to have a congenital disability.  During delivery. This is more likely to happen if you become infected for the first time late in your pregnancy.  After delivery. Your baby can get a herpes infection if you touch active ulcers and then touch your baby without washing your hands. The virus is less likely to pass to your baby if you had herpes before you became pregnant. This is because antibodies against the virus develop over a period of time. These antibodies help to protect the baby. How is this treated? This condition can be treated with medicines during pregnancy that are safe for you and your baby. These medicines can help to reduce symptoms, shorten an outbreak, and prevent another outbreak of the infection. If the infection happened before you became pregnant, you may need to take medicine late in your pregnancy to  help to prevent a breakout at the time of delivery. Follow these  instructions at home: To avoid passing the virus to your baby:  Wash your hands with soap and water often and before touching your baby.  If you have an outbreak, keep the area clean and covered.  If ulcers are present on your breast, do not breastfeed from the affected breast. Contact a health care provider if:  You have a rash, blisters, or ulcers in the area around your genitals or rectum.  You have burning, itching, or pain in the area around your genitals or rectum.  You have trouble urinating. Summary  Genital herpes is an STI (sexually transmitted infection) that is caused by the herpes simplex virus (HSV). If you are pregnant, you can pass the virus to your baby.  Even when the outbreak goes away, the virus stays in your body.  Genital herpes can be passed to your unborn or newborn baby and cause serious problems.  This condition can be treated with medicines during pregnancy that are safe for you and your baby. Medicines can treat your symptoms, shorten the length of an outbreak, and prevent another outbreak of the infection.  If you have signs or symptoms of a herpes outbreak when you go into labor, your health care provider may recommend a C-section (cesarean delivery) to lower the risk of passing the virus to your baby. This information is not intended to replace advice given to you by your health care provider. Make sure you discuss any questions you have with your health care provider. Document Revised: 04/21/2018 Document Reviewed: 03/30/2018 Elsevier Patient Education  2021 [employer].

## 2020-02-20 NOTE — Progress Notes (Signed)
+   Fetal movement. No complaints. Requests cervical check

## 2020-02-26 ENCOUNTER — Encounter: Payer: Self-pay | Admitting: *Deleted

## 2020-02-26 ENCOUNTER — Ambulatory Visit: Payer: BLUE CROSS/BLUE SHIELD | Attending: Women's Health

## 2020-02-26 ENCOUNTER — Encounter: Payer: Self-pay | Admitting: Women's Health

## 2020-02-26 ENCOUNTER — Ambulatory Visit: Payer: BLUE CROSS/BLUE SHIELD | Admitting: *Deleted

## 2020-02-26 ENCOUNTER — Other Ambulatory Visit: Payer: Self-pay

## 2020-02-26 DIAGNOSIS — O26843 Uterine size-date discrepancy, third trimester: Secondary | ICD-10-CM | POA: Insufficient documentation

## 2020-02-26 DIAGNOSIS — O99820 Streptococcus B carrier state complicating pregnancy: Secondary | ICD-10-CM | POA: Diagnosis present

## 2020-02-26 DIAGNOSIS — O352XX0 Maternal care for (suspected) hereditary disease in fetus, not applicable or unspecified: Secondary | ICD-10-CM

## 2020-02-26 DIAGNOSIS — Z3A37 37 weeks gestation of pregnancy: Secondary | ICD-10-CM | POA: Diagnosis not present

## 2020-02-26 DIAGNOSIS — Z148 Genetic carrier of other disease: Secondary | ICD-10-CM

## 2020-02-26 DIAGNOSIS — Z3480 Encounter for supervision of other normal pregnancy, unspecified trimester: Secondary | ICD-10-CM | POA: Diagnosis present

## 2020-02-28 ENCOUNTER — Other Ambulatory Visit: Payer: Self-pay

## 2020-02-28 ENCOUNTER — Ambulatory Visit (INDEPENDENT_AMBULATORY_CARE_PROVIDER_SITE_OTHER): Payer: BLUE CROSS/BLUE SHIELD | Admitting: Obstetrics and Gynecology

## 2020-02-28 VITALS — BP 126/76 | HR 105 | Wt 210.0 lb

## 2020-02-28 DIAGNOSIS — Z3480 Encounter for supervision of other normal pregnancy, unspecified trimester: Secondary | ICD-10-CM

## 2020-02-28 NOTE — Progress Notes (Signed)
   PRENATAL VISIT NOTE  Subjective:  Jean Solis is a 25 y.o. G2P1001 at [redacted]w[redacted]d being seen today for ongoing prenatal care.  She is currently monitored for the following issues for this low-risk pregnancy and has Eczema; Vitamin D deficiency; Pre-diabetes; Uterine size date discrepancy; Genital herpes; Supervision of other normal pregnancy, antepartum; Alpha thalassemia silent carrier; and GBS (group B Streptococcus carrier), +RV culture, currently pregnant on their problem list.  Patient reports no complaints.  Contractions: Not present. Vag. Bleeding: None.  Movement: Present. Denies leaking of fluid.   The following portions of the patient's history were reviewed and updated as appropriate: allergies, current medications, past family history, past medical history, past social history, past surgical history and problem list.   Objective:   Vitals:   02/28/20 1508  BP: 126/76  Pulse: (!) 105  Weight: 210 lb (95.3 kg)    Fetal Status: Fetal Heart Rate (bpm): 146 Fundal Height: 38 cm Movement: Present  Presentation: Vertex  General:  Alert, oriented and cooperative. Patient is in no acute distress.  Skin: Skin is warm and dry. No rash noted.   Cardiovascular: Normal heart rate noted  Respiratory: Normal respiratory effort, no problems with respiration noted  Abdomen: Soft, gravid, appropriate for gestational age.  Pain/Pressure: Present     Pelvic: Cervical exam performed in the presence of a chaperone Dilation: 1.5 Effacement (%): Thick Station: Ballotable  Extremities: Normal range of motion.  Edema: None  Mental Status: Normal mood and affect. Normal behavior. Normal judgment and thought content.   Assessment and Plan:  Pregnancy: G2P1001 at [redacted]w[redacted]d  1. Supervision of other normal pregnancy, antepartum  No longer interested in water birth Would like to be induced around 39 weeks.  Induction orders placed.  Start primrose oil.    Term labor symptoms and general obstetric  precautions including but not limited to vaginal bleeding, contractions, leaking of fluid and fetal movement were reviewed in detail with the patient. Please refer to After Visit Summary for other counseling recommendations.   Return in about 1 week (around 03/06/2020), or Afternoon visit for foley insertion..  Future Appointments  Date Time Provider Department Center  03/06/2020  2:20 PM Bre Pecina, Harolyn Rutherford, NP [facility] None    Venia Carbon, NP

## 2020-02-28 NOTE — Patient Instructions (Signed)
  Cervical Ripening (to get your cervix ready for labor) : May try one or all:  Red Raspberry Leaf capsules:  two 300mg or 400mg tablets with each meal, 2-3 times a day  Potential Side Effects Of Raspberry Leaf:  Most women do not experience any side effects from drinking raspberry leaf tea. However, nausea and loose stools are possible     Evening Primrose Oil capsules: may take 1 to 3 capsules daily. May also prick one to release the oil and insert it into your vagina at night.  Some of the potential side effects:  Upset stomach  Loose stools or diarrhea  Headaches  Nausea   4 Dates a day (may taste better if warmed in microwave until soft). Found where raisins are in the grocery store  

## 2020-02-29 ENCOUNTER — Ambulatory Visit: Payer: BLUE CROSS/BLUE SHIELD

## 2020-03-02 ENCOUNTER — Other Ambulatory Visit: Payer: Self-pay | Admitting: Advanced Practice Midwife

## 2020-03-03 ENCOUNTER — Telehealth (HOSPITAL_COMMUNITY): Payer: Self-pay | Admitting: *Deleted

## 2020-03-03 NOTE — Telephone Encounter (Signed)
Preadmission screen  

## 2020-03-04 ENCOUNTER — Telehealth (HOSPITAL_COMMUNITY): Payer: Self-pay | Admitting: *Deleted

## 2020-03-04 NOTE — Telephone Encounter (Signed)
Preadmission screen  

## 2020-03-05 ENCOUNTER — Encounter (HOSPITAL_COMMUNITY): Payer: Self-pay | Admitting: *Deleted

## 2020-03-05 ENCOUNTER — Other Ambulatory Visit (HOSPITAL_COMMUNITY)
Admission: RE | Admit: 2020-03-05 | Discharge: 2020-03-05 | Disposition: A | Discharge status: Home / Self Care | Payer: BLUE CROSS/BLUE SHIELD | Source: Ambulatory Visit | Attending: Family Medicine | Admitting: Family Medicine

## 2020-03-05 ENCOUNTER — Telehealth (HOSPITAL_COMMUNITY): Payer: Self-pay | Admitting: *Deleted

## 2020-03-05 DIAGNOSIS — Z20822 Contact with and (suspected) exposure to covid-19: Secondary | ICD-10-CM | POA: Insufficient documentation

## 2020-03-05 DIAGNOSIS — Z01812 Encounter for preprocedural laboratory examination: Secondary | ICD-10-CM | POA: Insufficient documentation

## 2020-03-05 LAB — SARS CORONAVIRUS 2 (TAT 6-24 HRS): SARS Coronavirus 2: NEGATIVE

## 2020-03-05 NOTE — Telephone Encounter (Signed)
Preadmission screen  

## 2020-03-06 ENCOUNTER — Other Ambulatory Visit: Payer: Self-pay

## 2020-03-06 ENCOUNTER — Ambulatory Visit (INDEPENDENT_AMBULATORY_CARE_PROVIDER_SITE_OTHER): Payer: BLUE CROSS/BLUE SHIELD | Admitting: Obstetrics and Gynecology

## 2020-03-06 VITALS — BP 128/77 | HR 110 | Wt 210.0 lb

## 2020-03-06 DIAGNOSIS — Z3480 Encounter for supervision of other normal pregnancy, unspecified trimester: Secondary | ICD-10-CM

## 2020-03-06 NOTE — Progress Notes (Signed)
.     PRENATAL VISIT NOTE  Subjective:  Jean Solis is a 25 y.o. G2P1001 at [redacted]w[redacted]d being seen today for ongoing prenatal care.  She is currently monitored for the following issues for this low-risk pregnancy and has Eczema; Vitamin D deficiency; Pre-diabetes; Uterine size date discrepancy; Genital herpes; Supervision of other normal pregnancy, antepartum; Alpha thalassemia silent carrier; and GBS (group B Streptococcus carrier), +RV culture, currently pregnant on their problem list.  Patient reports occasional contractions.  Contractions: Irregular. Vag. Bleeding: None.  Movement: Present. Denies leaking of fluid.   The following portions of the patient's history were reviewed and updated as appropriate: allergies, current medications, past family history, past medical history, past social history, past surgical history and problem list. Problem list updated.  Objective:   Vitals:   03/06/20 1428  BP: 128/77  Pulse: (!) 110  Weight: 210 lb (95.3 kg)    Fetal Status:     Movement: Present  Presentation: Vertex  General:  Alert, oriented and cooperative. Patient is in no acute distress.  Skin: Skin is warm and dry. No rash noted.   Cardiovascular: Normal heart rate noted  Respiratory: Normal respiratory effort, no problems with respiration noted  Abdomen: Soft, gravid, appropriate for gestational age.  Pain/Pressure: Present     Pelvic: Cervical exam performed Dilation: 1.5 Effacement (%): Thick Station: Ballotable  Extremities: Normal range of motion.     Mental Status:  Normal mood and affect. Normal behavior. Normal judgment and thought content.  Procedure: Patient informed of R/B/A of procedure. NST was performed and was reactive prior to procedure. NST:  EFM: Baseline: 140 bpm Toco: irregular, occasional  Procedure done to begin ripening of the cervix prior to admission for induction of labor. Appropriate time out taken. The patient was placed in the lithotomy position and the  cervix brought into view with sterile speculum. A ring forcep was used to guide the 57F foley through the internal os of the cervix. Foley Balloon filled with 50cc of sterile water. Plug inserted into end of the foley. Foley placed on tension and taped to medial thigh.  NST:  EFM Baseline: 140 bpm  Toco: irregular There were no signs of tachysystole or hypertonus. All equipment was removed and accounted for. The patient tolerated the procedure well.  Assessment and Plan:  Pregnancy: G2P1001 at [redacted]w[redacted]d 1. Supervision of other normal pregnancy, antepartum  - Fetal nonstress test; Future - On valtrex for suppression, no lesions or outbreak noted on exam today.  - induction scheduled for 2/25 @ [facility], patient to arrive tonight at 2345  S/p Outpatient placement of foley balloon catheter for cervical ripening. Induction of labor scheduled for tomorrow at 1200/0630/0700/0730/0800 am. Reassuring FHR tracing with no concerns at present. Warning signs given to patient to include return to MAU for heavy vaginal bleeding, Rupture of membranes, painful uterine contractions q 5 mins or less, severe abdominal discomfort, decreased fetal movement.  No follow-ups on file.   Venia Carbon, NP 03/06/2020 3:36 PM

## 2020-03-06 NOTE — Patient Instructions (Signed)

## 2020-03-07 ENCOUNTER — Inpatient Hospital Stay (HOSPITAL_COMMUNITY): Payer: BLUE CROSS/BLUE SHIELD | Admitting: Anesthesiology

## 2020-03-07 ENCOUNTER — Encounter (HOSPITAL_COMMUNITY): Payer: Self-pay | Admitting: Obstetrics & Gynecology

## 2020-03-07 ENCOUNTER — Inpatient Hospital Stay (HOSPITAL_COMMUNITY): Payer: BLUE CROSS/BLUE SHIELD

## 2020-03-07 ENCOUNTER — Inpatient Hospital Stay (HOSPITAL_COMMUNITY)
Admission: AD | Admit: 2020-03-07 | Discharge: 2020-03-08 | DRG: 807 | Disposition: A | Discharge status: Home / Self Care | Payer: BLUE CROSS/BLUE SHIELD | Attending: Obstetrics & Gynecology | Admitting: Obstetrics & Gynecology

## 2020-03-07 DIAGNOSIS — O99824 Streptococcus B carrier state complicating childbirth: Secondary | ICD-10-CM | POA: Diagnosis present

## 2020-03-07 DIAGNOSIS — A6000 Herpesviral infection of urogenital system, unspecified: Secondary | ICD-10-CM | POA: Diagnosis present

## 2020-03-07 DIAGNOSIS — Z3A39 39 weeks gestation of pregnancy: Secondary | ICD-10-CM | POA: Diagnosis not present

## 2020-03-07 DIAGNOSIS — O9832 Other infections with a predominantly sexual mode of transmission complicating childbirth: Principal | ICD-10-CM | POA: Diagnosis present

## 2020-03-07 DIAGNOSIS — O26893 Other specified pregnancy related conditions, third trimester: Secondary | ICD-10-CM | POA: Diagnosis present

## 2020-03-07 DIAGNOSIS — R7303 Prediabetes: Secondary | ICD-10-CM | POA: Diagnosis present

## 2020-03-07 DIAGNOSIS — Z20822 Contact with and (suspected) exposure to covid-19: Secondary | ICD-10-CM | POA: Diagnosis present

## 2020-03-07 DIAGNOSIS — Z3490 Encounter for supervision of normal pregnancy, unspecified, unspecified trimester: Secondary | ICD-10-CM | POA: Diagnosis present

## 2020-03-07 DIAGNOSIS — Z3480 Encounter for supervision of other normal pregnancy, unspecified trimester: Secondary | ICD-10-CM

## 2020-03-07 DIAGNOSIS — O99820 Streptococcus B carrier state complicating pregnancy: Secondary | ICD-10-CM

## 2020-03-07 DIAGNOSIS — D563 Thalassemia minor: Secondary | ICD-10-CM | POA: Diagnosis present

## 2020-03-07 LAB — TYPE AND SCREEN
ABO/RH(D): B POS
Antibody Screen: NEGATIVE

## 2020-03-07 LAB — CBC
HCT: 36 % (ref 36.0–46.0)
Hemoglobin: 11.7 g/dL — ABNORMAL LOW (ref 12.0–15.0)
MCH: 26.8 pg (ref 26.0–34.0)
MCHC: 32.5 g/dL (ref 30.0–36.0)
MCV: 82.4 fL (ref 80.0–100.0)
Platelets: 200 10*3/uL (ref 150–400)
RBC: 4.37 MIL/uL (ref 3.87–5.11)
RDW: 14.6 % (ref 11.5–15.5)
WBC: 8.4 10*3/uL (ref 4.0–10.5)
nRBC: 0 % (ref 0.0–0.2)

## 2020-03-07 LAB — RPR: RPR Ser Ql: NONREACTIVE

## 2020-03-07 MED ORDER — DIPHENHYDRAMINE HCL 50 MG/ML IJ SOLN
12.5000 mg | INTRAMUSCULAR | Status: DC | PRN
  Administered 2020-03-07: 12.5 mg via INTRAVENOUS
  Filled 2020-03-07: qty 1

## 2020-03-07 MED ORDER — EPHEDRINE 5 MG/ML INJ: 10.0000 mg | INTRAVENOUS | Status: DC | PRN

## 2020-03-07 MED ORDER — ONDANSETRON HCL 4 MG/2ML IJ SOLN: 4.0000 mg | Freq: Four times a day (QID) | INTRAMUSCULAR | Status: DC | PRN

## 2020-03-07 MED ORDER — SIMETHICONE 80 MG PO CHEW: 80.0000 mg | CHEWABLE_TABLET | ORAL | Status: DC | PRN

## 2020-03-07 MED ORDER — DIPHENHYDRAMINE HCL 25 MG PO CAPS: 25.0000 mg | ORAL_CAPSULE | Freq: Four times a day (QID) | ORAL | Status: DC | PRN

## 2020-03-07 MED ORDER — FENTANYL-BUPIVACAINE-NACL 0.5-0.125-0.9 MG/250ML-% EP SOLN
12.0000 mL/h | EPIDURAL | Status: DC | PRN
  Administered 2020-03-07: 12 mL/h via EPIDURAL
  Filled 2020-03-07: qty 250

## 2020-03-07 MED ORDER — OXYTOCIN-SODIUM CHLORIDE 30-0.9 UT/500ML-% IV SOLN: 2.5000 [IU]/h | INTRAVENOUS | Status: DC

## 2020-03-07 MED ORDER — ONDANSETRON HCL 4 MG/2ML IJ SOLN: 4.0000 mg | INTRAMUSCULAR | Status: DC | PRN

## 2020-03-07 MED ORDER — SODIUM CHLORIDE 0.9 % IV SOLN
5.0000 10*6.[IU] | Freq: Once | INTRAVENOUS | Status: AC
  Administered 2020-03-07: 5 10*6.[IU] via INTRAVENOUS
  Filled 2020-03-07: qty 5

## 2020-03-07 MED ORDER — MISOPROSTOL 50MCG HALF TABLET: 50.0000 ug | ORAL_TABLET | ORAL | Status: DC | PRN

## 2020-03-07 MED ORDER — LIDOCAINE-EPINEPHRINE (PF) 2 %-1:200000 IJ SOLN
INTRAMUSCULAR | Status: DC | PRN
  Administered 2020-03-07: 5 mL via EPIDURAL

## 2020-03-07 MED ORDER — ZOLPIDEM TARTRATE 5 MG PO TABS: 5.0000 mg | ORAL_TABLET | Freq: Every evening | ORAL | Status: DC | PRN

## 2020-03-07 MED ORDER — LIDOCAINE HCL (PF) 1 % IJ SOLN
30.0000 mL | INTRAMUSCULAR | Status: DC | PRN
Start: 2020-03-07 — End: 2020-03-07

## 2020-03-07 MED ORDER — OXYCODONE-ACETAMINOPHEN 5-325 MG PO TABS: 1.0000 | ORAL_TABLET | ORAL | Status: DC | PRN

## 2020-03-07 MED ORDER — SOD CITRATE-CITRIC ACID 500-334 MG/5ML PO SOLN: 30.0000 mL | ORAL | Status: DC | PRN

## 2020-03-07 MED ORDER — IBUPROFEN 600 MG PO TABS
600.0000 mg | ORAL_TABLET | Freq: Four times a day (QID) | ORAL | Status: DC
  Administered 2020-03-07 – 2020-03-08 (×4): 600 mg via ORAL
  Filled 2020-03-07 (×4): qty 1

## 2020-03-07 MED ORDER — OXYTOCIN-SODIUM CHLORIDE 30-0.9 UT/500ML-% IV SOLN
INTRAVENOUS | Status: AC
  Administered 2020-03-07: 2.5 [IU]/h via INTRAVENOUS
  Filled 2020-03-07: qty 500

## 2020-03-07 MED ORDER — ACETAMINOPHEN 325 MG PO TABS
650.0000 mg | ORAL_TABLET | ORAL | Status: DC | PRN
Start: 2020-03-07 — End: 2020-03-07

## 2020-03-07 MED ORDER — PRENATAL MULTIVITAMIN CH
1.0000 | ORAL_TABLET | Freq: Every day | ORAL | Status: DC
  Administered 2020-03-07: 1 via ORAL
  Filled 2020-03-07: qty 1

## 2020-03-07 MED ORDER — BENZOCAINE-MENTHOL 20-0.5 % EX AERO: 1.0000 "application " | INHALATION_SPRAY | CUTANEOUS | Status: DC | PRN

## 2020-03-07 MED ORDER — SENNOSIDES-DOCUSATE SODIUM 8.6-50 MG PO TABS: 2.0000 | ORAL_TABLET | Freq: Every day | ORAL | Status: DC

## 2020-03-07 MED ORDER — ONDANSETRON HCL 4 MG PO TABS: 4.0000 mg | ORAL_TABLET | ORAL | Status: DC | PRN

## 2020-03-07 MED ORDER — TERBUTALINE SULFATE 1 MG/ML IJ SOLN
0.2500 mg | Freq: Once | INTRAMUSCULAR | Status: DC | PRN
Start: 2020-03-07 — End: 2020-03-07

## 2020-03-07 MED ORDER — COCONUT OIL OIL: 1.0000 "application " | TOPICAL_OIL | Status: DC | PRN

## 2020-03-07 MED ORDER — PENICILLIN G POT IN DEXTROSE 60000 UNIT/ML IV SOLN
3.0000 10*6.[IU] | INTRAVENOUS | Status: DC
  Administered 2020-03-07: 3 10*6.[IU] via INTRAVENOUS
  Filled 2020-03-07: qty 50

## 2020-03-07 MED ORDER — LACTATED RINGERS IV SOLN
500.0000 mL | Freq: Once | INTRAVENOUS | Status: AC
  Administered 2020-03-07: 500 mL via INTRAVENOUS

## 2020-03-07 MED ORDER — TETANUS-DIPHTH-ACELL PERTUSSIS 5-2.5-18.5 LF-MCG/0.5 IM SUSY: 0.5000 mL | PREFILLED_SYRINGE | Freq: Once | INTRAMUSCULAR | Status: DC

## 2020-03-07 MED ORDER — ACETAMINOPHEN 325 MG PO TABS
650.0000 mg | ORAL_TABLET | ORAL | Status: DC | PRN
  Administered 2020-03-07 – 2020-03-08 (×3): 650 mg via ORAL
  Filled 2020-03-07 (×3): qty 2

## 2020-03-07 MED ORDER — PHENYLEPHRINE 40 MCG/ML (10ML) SYRINGE FOR IV PUSH (FOR BLOOD PRESSURE SUPPORT)
80.0000 ug | PREFILLED_SYRINGE | INTRAVENOUS | Status: AC | PRN
  Administered 2020-03-07 (×3): 80 ug via INTRAVENOUS

## 2020-03-07 MED ORDER — LACTATED RINGERS IV SOLN: INTRAVENOUS | Status: DC

## 2020-03-07 MED ORDER — MISOPROSTOL 50MCG HALF TABLET: 50.0000 ug | ORAL_TABLET | ORAL | Status: DC

## 2020-03-07 MED ORDER — WITCH HAZEL-GLYCERIN EX PADS: 1.0000 "application " | MEDICATED_PAD | CUTANEOUS | Status: DC | PRN

## 2020-03-07 MED ORDER — OXYCODONE-ACETAMINOPHEN 5-325 MG PO TABS: 2.0000 | ORAL_TABLET | ORAL | Status: DC | PRN

## 2020-03-07 MED ORDER — DIBUCAINE (PERIANAL) 1 % EX OINT: 1.0000 "application " | TOPICAL_OINTMENT | CUTANEOUS | Status: DC | PRN

## 2020-03-07 MED ORDER — PHENYLEPHRINE 40 MCG/ML (10ML) SYRINGE FOR IV PUSH (FOR BLOOD PRESSURE SUPPORT)
80.0000 ug | PREFILLED_SYRINGE | INTRAVENOUS | Status: DC | PRN
  Filled 2020-03-07 (×2): qty 10

## 2020-03-07 MED ORDER — FERROUS SULFATE 325 (65 FE) MG PO TABS
325.0000 mg | ORAL_TABLET | ORAL | Status: DC
  Administered 2020-03-07: 325 mg via ORAL
  Filled 2020-03-07: qty 1

## 2020-03-07 MED ORDER — MEDROXYPROGESTERONE ACETATE 150 MG/ML IM SUSP: 150.0000 mg | INTRAMUSCULAR | Status: DC | PRN

## 2020-03-07 MED ORDER — LACTATED RINGERS IV SOLN
500.0000 mL | INTRAVENOUS | Status: DC | PRN
  Administered 2020-03-07: 250 mL via INTRAVENOUS
  Administered 2020-03-07: 500 mL via INTRAVENOUS

## 2020-03-07 NOTE — Discharge Summary (Addendum)
° °  Postpartum Discharge Summary ° °   °[name] °DOB: 03/16/1995 °MRN: [id number] ° °Date of admission: 03/07/2020 °Delivery date:03/07/2020  °Delivering provider: LEGGETT, KELLY H  °Date of discharge: 03/08/2020 ° °Admitting diagnosis: Encounter for elective induction of labor [Z34.90] °Intrauterine pregnancy: [redacted]w[redacted]d     °Secondary diagnosis:  Active Problems: °  Pre-diabetes °  Vaginal delivery °  Genital herpes °  Supervision of other normal pregnancy, antepartum °  Alpha thalassemia silent carrier °  GBS (group B Streptococcus carrier), +RV culture, currently pregnant °  Encounter for elective induction of labor ° °Additional problems: none °Discharge diagnosis: Term Pregnancy Delivered                                              °Post partum procedures:none °Augmentation: OP Foley °Complications: None ° °Hospital course: Induction of Labor With Vaginal Delivery   °24 y.o. G2P2002 at [redacted]w[redacted]d was admitted to the hospital 03/07/2020 for induction of labor.  Indication for induction: Elective.  Patient had an uncomplicated labor course as follows: °Membrane Rupture Time/Date: 4:56 AM ,03/07/2020   °Delivery Method:Vaginal, Spontaneous  °Episiotomy: None  °Lacerations:  Periurethral  °Details of delivery can be found in separate delivery note. Patient had a few mildly elevated blood pressures to 130 systolic postpartum, but repeat on morning of discharge was 111/68. No meds were started. She otherwise had a routine postpartum course. Patient is discharged home 03/08/20. ° °Newborn Data: °Birth date:03/07/2020  °Birth time:8:47 AM  °Gender:Female  °Living status:Living  °Apgars:9 ,9  °Weight:3615 g  ° °Magnesium Sulfate received: No °BMZ received: No °Rhophylac:No °MMR:No °T-DaP:Given prenatally °Flu: Yes °Transfusion:No ° °Physical exam  °Vitals:  ° 03/07/20 1541 03/07/20 2025 03/07/20 2341 03/08/20 0644  °BP: 118/62 130/71 129/82 111/68  °Pulse: 86 99 81 90  °Resp: 18 18 16 20  °Temp: 97.8 °F (36.6 °C)  98.3 °F (36.8 °C) 98.6 °F (37 °C) 98.5 °F (36.9 °C)  °TempSrc: Oral Oral Oral Oral  °SpO2: 100% 99% 100% 99%  °Weight:      °Height:      ° °General: alert, cooperative and no distress °Lochia: appropriate °Uterine Fundus: firm °Incision: N/A °DVT Evaluation: No cords or calf tenderness. °Labs: °Lab Results  °Component Value Date  ° WBC 8.4 03/07/2020  ° HGB 11.7 (L) 03/07/2020  ° HCT 36.0 03/07/2020  ° MCV 82.4 03/07/2020  ° PLT 200 03/07/2020  ° °CMP Latest Ref Rng & Units 02/19/2019  °Glucose 65 - 99 mg/dL 83  °BUN 6 - 20 mg/dL 10  °Creatinine 0.57 - 1.00 mg/dL 0.69  °Sodium 134 - 144 mmol/L 138  °Potassium 3.5 - 5.2 mmol/L 4.2  °Chloride 96 - 106 mmol/L 101  °CO2 20 - 29 mmol/L 24  °Calcium 8.7 - 10.2 mg/dL 9.7  °Total Protein 6.0 - 8.5 g/dL 7.7  °Total Bilirubin 0.0 - 1.2 mg/dL <0.2  °Alkaline Phos 39 - 117 IU/L 89  °AST 0 - 40 IU/L 17  °ALT 0 - 32 IU/L 12  ° °Edinburgh Score: °Edinburgh Postnatal Depression Scale Screening Tool 03/08/2020  °I have been able to laugh and see the funny side of things. 0  °I have looked forward with enjoyment to things. 0  °I have blamed myself unnecessarily when things went wrong. 1  °I have been anxious or worried for no good reason. 2  °  I have felt scared or panicky for no good reason. 1  °Things have been getting on top of me. 1  °I have been so unhappy that I have had difficulty sleeping. 2  °I have felt sad or miserable. 1  °I have been so unhappy that I have been crying. 1  °The thought of harming myself has occurred to me. 0  °Edinburgh Postnatal Depression Scale Total 9  ° ° ° °After visit meds:  °Allergies as of 03/08/2020   °No Known Allergies °  °  °Medication List  °  °STOP taking these medications   °Comfort Fit Maternity Supp Med Misc °  °Evening Primrose Oil 1000 MG Caps °  °triamcinolone 0.1 % °Commonly known as: KENALOG °  °valACYclovir 500 MG tablet °Commonly known as: Valtrex °  °  °TAKE these medications   °acetaminophen 500 MG tablet °Commonly known as:  TYLENOL °Take 2 tablets (1,000 mg total) by mouth every 6 (six) hours as needed. °  °coconut oil Oil °Apply 1 application topically as needed. °  °ibuprofen 600 MG tablet °Commonly known as: ADVIL °Take 1 tablet (600 mg total) by mouth every 6 (six) hours. °  °Vitafol Ultra 29-0.6-0.4-200 MG Caps °Take 1 tablet by mouth daily. °  °  ° ° °Discharge home in stable condition °Infant Feeding: Breast °Infant Disposition:home with mother °Discharge instruction: per After Visit Summary and Postpartum booklet. °Activity: Advance as tolerated. Pelvic rest for 6 weeks.  °Diet: routine diet °Future Appointments:No future appointments. °Follow up Visit: Message sent to [facility] by  03/08/20. ° ° °Please schedule this patient for a in-person (wants IUD) postpartum visit in 6 weeks with the following provider: APP.(midwife preferred)  °Additional Postpartum F/U:BP check 1 week  °Low risk pregnancy complicated by: HTN, no diagnosis but elevated postpartum, no meds started °Delivery mode:  Vaginal, Spontaneous  °Anticipated Birth Control:  IUD at postpartum visit ° ° °03/08/2020 ° C , MD ° ° °

## 2020-03-07 NOTE — Lactation Note (Signed)
This note was copied from a baby's chart. Lactation Consultation Note  [name] RUEAV'W Date: 03/07/2020 Reason for consult: Initial assessment;Mother's request;Difficult latch;Term;Other (Comment) (Alph thalaseemina trait) Age:25 Infant sleepy at the breast. Spoon fed 2 ml of colostrum. LC attempted a latch but only few sucks. Infant can extend tongue pass the gum line. Mom to offer EBM via spoon or finger feed if unable to get infant to latch.    Plan 1. To feed based on cues 8-12x in 24 hours no more than 4 hours without an attempt. Mom to offer both breasts and look for signs of milk transfer.          2. Mom use manual pump q 3 hrs for 15 minutes.           3. I and O sheet reviewed          4. [facility] brochure of inpatient and outpatient services provided.  Maternal Data Has patient been taught Hand Expression?: Yes Does the patient have breastfeeding experience prior to this delivery?: Yes How long did the patient breastfeed?: 3 months  Feeding Mother's Current Feeding Choice: Breast Milk  LATCH Score Latch: Too sleepy or reluctant, no latch achieved, no sucking elicited.  Audible Swallowing: None  Type of Nipple: Everted at rest and after stimulation  Comfort (Breast/Nipple): Soft / non-tender  Hold (Positioning): Assistance needed to correctly position infant at breast and maintain latch.  LATCH Score: 5   Lactation Tools Discussed/Used Tools: Pump;Flanges Flange Size: 27 Breast pump type: Manual Pump Education: Setup, frequency, and cleaning;Milk Storage Reason for Pumping: Increase stimulation Pumping frequency: every 3 hours for 15 minutes  Interventions Interventions: Breast feeding basics reviewed;Support pillows;Education;Assisted with latch;Position options;Skin to skin;Expressed milk;Breast massage;Coconut oil;Hand express;Pre-pump if needed;Hand pump;Breast compression;Adjust position  Discharge [facility] Program: No  Consult  Status Consult Status: Follow-up Date: 03/08/20 Follow-up type: In-patient    Taryne Kiger  Nicholson-Springer 03/07/2020, 6:24 PM

## 2020-03-07 NOTE — Anesthesia Preprocedure Evaluation (Signed)
Anesthesia Evaluation  Patient identified by MRN, date of birth, ID band Patient awake    Reviewed: Allergy & Precautions, NPO status , Patient's Chart, lab work & pertinent test results  Airway Mallampati: II  TM Distance: >3 FB Neck ROM: Full    Dental no notable dental hx.    Pulmonary neg pulmonary ROS,    Pulmonary exam normal breath sounds clear to auscultation       Cardiovascular negative cardio ROS Normal cardiovascular exam Rhythm:Regular Rate:Normal     Neuro/Psych  Headaches, negative psych ROS   GI/Hepatic negative GI ROS, Neg liver ROS,   Endo/Other  negative endocrine ROS  Renal/GU negative Renal ROS  negative genitourinary   Musculoskeletal negative musculoskeletal ROS (+)   Abdominal   Peds  Hematology negative hematology ROS (+)   Anesthesia Other Findings Elective IOL  Reproductive/Obstetrics (+) Pregnancy                             Anesthesia Physical Anesthesia Plan  ASA: II  Anesthesia Plan: Epidural   Post-op Pain Management:    Induction:   PONV Risk Score and Plan: Treatment may vary due to age or medical condition  Airway Management Planned: Natural Airway  Additional Equipment:   Intra-op Plan:   Post-operative Plan:   Informed Consent: I have reviewed the patients History and Physical, chart, labs and discussed the procedure including the risks, benefits and alternatives for the proposed anesthesia with the patient or authorized representative who has indicated his/her understanding and acceptance.       Plan Discussed with: Anesthesiologist  Anesthesia Plan Comments: (Patient identified. Risks, benefits, options discussed with patient including but not limited to bleeding, infection, nerve damage, paralysis, failed block, incomplete pain control, headache, blood pressure changes, nausea, vomiting, reactions to medication, itching, and post  partum back pain. Confirmed with bedside nurse the patient's most recent platelet count. Confirmed with the patient that they are not taking any anticoagulation, have any bleeding history or any family history of bleeding disorders. Patient expressed understanding and wishes to proceed. All questions were answered. )        Anesthesia Quick Evaluation

## 2020-03-07 NOTE — Anesthesia Postprocedure Evaluation (Signed)
Anesthesia Post Note  Patient: LYRICK WORLAND  Procedure(s) Performed: AN AD HOC LABOR EPIDURAL     Patient location during evaluation: Mother Baby Anesthesia Type: Epidural Level of consciousness: awake and alert Pain management: pain level controlled Vital Signs Assessment: post-procedure vital signs reviewed and stable Respiratory status: spontaneous breathing, nonlabored ventilation and respiratory function stable Cardiovascular status: stable Postop Assessment: no headache, no backache and epidural receding Anesthetic complications: no   No complications documented.  Last Vitals:  Vitals:   03/07/20 1100 03/07/20 1203  BP: 129/74 134/85  Pulse: 96 98  Resp: 16 16  Temp: 37.1 C 36.9 C  SpO2: 100% 100%    Last Pain:  Vitals:   03/07/20 1203  TempSrc: Oral  PainSc: 0-No pain   Pain Goal:                   Marquisa Salih

## 2020-03-07 NOTE — Anesthesia Procedure Notes (Signed)
Epidural Patient location during procedure: OB Start time: 03/07/2020 2:40 AM End time: 03/07/2020 2:50 AM  Staffing Anesthesiologist: Elmer Picker, MD Performed: anesthesiologist   Preanesthetic Checklist Completed: patient identified, IV checked, risks and benefits discussed, monitors and equipment checked, pre-op evaluation and timeout performed  Epidural Patient position: sitting Prep: DuraPrep and site prepped and draped Patient monitoring: continuous pulse ox, blood pressure, heart rate and cardiac monitor Approach: midline Location: L3-L4 Injection technique: LOR air  Needle:  Needle type: Tuohy  Needle gauge: 17 G Needle length: 9 cm Needle insertion depth: 7 cm Catheter type: closed end flexible Catheter size: 19 Gauge Catheter at skin depth: 12 cm Test dose: negative  Assessment Sensory level: T8 Events: blood not aspirated, injection not painful, no injection resistance, no paresthesia and negative IV test  Additional Notes Patient identified. Risks/Benefits/Options discussed with patient including but not limited to bleeding, infection, nerve damage, paralysis, failed block, incomplete pain control, headache, blood pressure changes, nausea, vomiting, reactions to medication both or allergic, itching and postpartum back pain. Confirmed with bedside nurse the patient's most recent platelet count. Confirmed with patient that they are not currently taking any anticoagulation, have any bleeding history or any family history of bleeding disorders. Patient expressed understanding and wished to proceed. All questions were answered. Sterile technique was used throughout the entire procedure. Please see nursing notes for vital signs. Test dose was given through epidural catheter and negative prior to continuing to dose epidural or start infusion. Warning signs of high block given to the patient including shortness of breath, tingling/numbness in hands, complete motor block,  or any concerning symptoms with instructions to call for help. Patient was given instructions on fall risk and not to get out of bed. All questions and concerns addressed with instructions to call with any issues or inadequate analgesia.  Reason for block:procedure for pain

## 2020-03-07 NOTE — Lactation Note (Signed)
This note was copied from a baby's chart. Lactation Consultation Note  [name] ACZYS'A Date: 03/07/2020 Reason for consult: L&D Initial assessment;Term;Other (Comment) (per LD RN , baby latched after delivery and mom requested to eat breakfast and try latching afterwards.) Age: [facility] visited baby greater than 60 mins due to above reason.  As LC entered the room baby sucking on a pacifier.  LC reviewed reasons for avoiding a pacifier in the early stages of breast feeding.  Baby rooting, LC offered to assist to latch and mom receptive. Baby STS with mom, and latched easily with depth, swallows noted.  Latch - 8. Baby released his own.  Mom aware she will be seen on MBU unit.  Maternal Data Has patient been taught Hand Expression?: Yes Does the patient have breastfeeding experience prior to this delivery?: Yes How long did the patient breastfeed?: per mom 3 months, and then her supply wasn't enough , so she had to supplement. Per mom no breast changes with her 1st or this pregnancy. Easily hand expressed.  Feeding Mother's Current Feeding Choice: Breast Milk  LATCH Score Latch: Grasps breast easily, tongue down, lips flanged, rhythmical sucking.  Audible Swallowing: A few with stimulation  Type of Nipple: Everted at rest and after stimulation  Comfort (Breast/Nipple): Soft / non-tender  Hold (Positioning): Assistance needed to correctly position infant at breast and maintain latch.  LATCH Score: 8   Lactation Tools Discussed/Used    Interventions Interventions: Breast feeding basics reviewed;Assisted with latch;Skin to skin;Breast compression;Adjust position;Support pillows;Position options;Education  Discharge    Consult Status Consult Status: Follow-up Date: 03/07/20 Follow-up type: In-patient    Jean Solis 03/07/2020, 10:58 AM

## 2020-03-07 NOTE — Progress Notes (Signed)
Patient Vitals for the past 4 hrs:  BP Temp Temp src Pulse Resp SpO2  03/07/20 0505 (!) 94/58 98.3 F (36.8 C) Oral 97 -- --  03/07/20 0350 111/64 -- -- 88 18 96 %  03/07/20 0345 110/64 -- -- 88 17 96 %  03/07/20 0340 104/66 -- -- 88 18 94 %  03/07/20 0336 (!) 96/59 -- -- 94 -- --  03/07/20 0335 -- -- -- -- -- 94 %  03/07/20 0331 (!) 88/49 -- -- (!) 105 -- --  03/07/20 0330 -- -- -- -- -- 97 %  03/07/20 0326 (!) 98/50 -- -- 97 -- --  03/07/20 0321 (!) 93/46 -- -- (!) 102 -- --  03/07/20 0320 (!) 93/46 -- -- (!) 102 18 97 %  03/07/20 0316 (!) 108/51 -- -- (!) 106 -- --  03/07/20 0315 (!) 108/51 -- -- (!) 106 18 96 %  03/07/20 0314 (!) 97/38 -- -- (!) 108 -- --  03/07/20 0311 (!) 83/27 -- -- (!) 110 -- --  03/07/20 0310 -- -- -- -- 18 97 %  03/07/20 0308 (!) 90/32 -- -- 100 -- --  03/07/20 0306 (!) 98/42 -- -- (!) 111 -- --  03/07/20 0303 (!) 101/45 -- -- (!) 107 -- --  03/07/20 0301 (!) 100/44 -- -- (!) 105 -- --  03/07/20 0300 (!) 97/51 -- -- (!) 105 -- 96 %  03/07/20 0256 (!) 91/34 -- -- (!) 121 -- --  03/07/20 0255 -- -- -- -- -- 100 %  03/07/20 0251 (!) 135/57 -- -- (!) 134 -- --  03/07/20 0250 -- -- -- -- -- 100 %   Pt has progressed without further augmentation.  Comfortable w/epidural. Had some variables down to 90's, improved w/position changes/IVF bolus.  Now mild, some early decels.  Ctx q 2 minutes.  8/80/-1/-2.  Expect SVD soon

## 2020-03-07 NOTE — H&P (Addendum)
Jean Solis is a 25 y.o. female G2P1001 with IUP at [redacted]w[redacted]d presenting for IOL for elective. 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 Chlamydia 09/2011  . Headache   . STD (sexually transmitted disease)     Past Surgical History: Past Surgical History:  Procedure Laterality Date  . BARTHOLIN CYST MARSUPIALIZATION Right 2012  . TONSILLECTOMY      Obstetrical History: OB History    Gravida  2   Para  1   Term  1   Preterm  0   AB  0   Living  1     SAB  0   IAB  0   Ectopic  0   Multiple  0   Live Births  1           Social History: Social History   Socioeconomic History  . Marital status: Single    Spouse name: Not on file  . Number of children: 1  . Years of education: 39  . Highest education level: Not on file  Occupational History  . Not on file  Tobacco Use  . Smoking status: Never Smoker  . Smokeless tobacco: Never Used  Vaping Use  . Vaping Use: Never used  Substance and Sexual Activity  . Alcohol use: Not Currently    Alcohol/week: 1.0 standard drink    Types: 1 Standard drinks or equivalent per week  . Drug use: No  . Sexual activity: Yes    Birth control/protection: None  Other Topics Concern  . Not on file  Social History Narrative  . Not on file   Social Determinants of Health   Financial Resource Strain: Not on file  Food Insecurity: Not on file  Transportation Needs: Not on file  Physical Activity: Not on file  Stress: Not on file  Social Connections: Not on file    Family History: Family History  Problem Relation Age of Onset  . Hypertension Mother   . Hyperlipidemia Mother   . Hyperlipidemia Father   . Hypertension Father   . Hypertension Maternal Grandmother   . Diabetes Maternal Uncle     Allergies: No Known Allergies  Medications Prior to Admission  Medication Sig Dispense  Refill Last Dose  . Elastic Bandages & Supports (COMFORT FIT MATERNITY SUPP MED) MISC Wear daily when ambulating 1 each 0   . Prenat-Fe Poly-Methfol-FA-DHA (VITAFOL ULTRA) 29-0.6-0.4-200 MG CAPS Take 1 tablet by mouth daily. (Patient not taking: Reported on 03/06/2020) 30 capsule 12   . triamcinolone (KENALOG) 0.1 % Use twice per day for 14 days.  Use an emollient cream/lotion on top of or under the steroid cream also. (Patient not taking: No sig reported) 30 g 0   . valACYclovir (VALTREX) 500 MG tablet Take 1 tablet (500 mg total) by mouth 2 (two) times daily. (Patient not taking: Reported on 03/06/2020) 60 tablet 8         Review of Systems   Constitutional: Negative for fever and chills Eyes: Negative for visual disturbances Respiratory: Negative for shortness of breath, dyspnea Cardiovascular: Negative for chest pain or palpitations  Gastrointestinal: Negative for abdominal pain, vomiting, diarrhea and constipation.   Genitourinary: Negative for dysuria and urgency Musculoskeletal: Negative for back pain, joint pain, myalgias  Neurological: Negative for dizziness and headaches      Blood pressure 132/78, pulse (!) 116, temperature 97.7 F (36.5  C), temperature source Oral, resp. rate 18, height 5\' 5"  (1.651 m), weight 95.8 kg, last menstrual period 06/06/2019. General appearance: alert, cooperative and no distress Lungs: normal respiratory effort Heart: regular rate and rhythm Abdomen: soft, non-tender; bowel sounds normal Extremities: Homans sign is negative, no sign of DVT DTR's 2+ Presentation: cephalic Fetal monitoring  Baseline: 140 bpm, Variability: Good {> 6 bpm), Accelerations: Reactive and Decelerations: Absent Uterine activity  None  Foley sitting in vagina; removed . No visible HSV lesions Dilation: 5.5 Effacement (%): Thick Station: -2 Exam by:: [email or web address], CNM   Prenatal labs: ABO, Rh: B/Positive/-- (08/16 1601) Antibody: Negative (08/16  1601) Rubella: immune RPR: Non Reactive (12/06 1050)  HBsAg: Negative (08/16 1601)  HIV: Non Reactive (12/06 1050)  GBS: Positive/-- (02/02 1136)   Nursing Staff Provider  Office Location  [facility] Dating  LMP  Language  ENGLISH Anatomy 11-08-1984  Normal  Flu Vaccine  09-24-19 Genetic Screen  NIPS: low risks   AFP:  Negative   TDaP vaccine  01-01-20 Hgb A1C or  GTT Early A1C 5.5 Third trimester 2 hour wnl Component     Latest Ref Rng & Units 12/17/2019  Glucose, Fasting     65 - 91 mg/dL 75  Glucose, 1 hour     65 - 179 mg/dL 71  Glucose, 2 hour     65 - 152 mg/dL 74    Rhogam  N/A   LAB RESULTS     Blood Type B/Positive/-- (08/16 1601)   Feeding Plan  BREASTFEED Antibody Negative (08/16 1601)  Contraception  IUD-MIRENA Rubella 4.13 (08/16 1601)  Circumcision  YES RPR Non Reactive (12/06 1050)   Pediatrician   [facility] HBsAg Negative (08/16 1601)   Support Person  MOTHER HCVAb Negative  Prenatal Classes no HIV Non Reactive (12/06 1050)     BTL Consent  GBS   POSITIVE  VBAC Consent  Pap 08/27/19 normal     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  Assessment: Jean Solis is a 25 y.o. G2P1001 with an IUP at [redacted]w[redacted]d presenting for IOL for elective.  Discussed that a waterbirth was still on the table unless pitocin started (or other opt-out reason occurs).  .  Plan: #Labor: cytotec after PCN infused #Pain:  Per request #FWB Cat 1 #ID: GBS: PCN      03/07/2020, 12:49 AM

## 2020-03-08 MED ORDER — COCONUT OIL OIL: 1.0000 "application " | TOPICAL_OIL | 0 refills | Status: DC | PRN

## 2020-03-08 MED ORDER — IBUPROFEN 600 MG PO TABS: 600.0000 mg | ORAL_TABLET | Freq: Four times a day (QID) | ORAL | Status: DC

## 2020-03-08 MED ORDER — ACETAMINOPHEN 500 MG PO TABS: 1000.0000 mg | ORAL_TABLET | Freq: Four times a day (QID) | ORAL | Status: DC | PRN

## 2020-03-08 NOTE — Plan of Care (Signed)
  Problem: Education: Goal: Knowledge of condition will improve Outcome: Progressing   Problem: Activity: Goal: Will verbalize the importance of balancing activity with adequate rest periods Outcome: Progressing   Problem: Life Cycle: Goal: Chance of risk for complications during the postpartum period will decrease Outcome: Progressing   Problem: Role Relationship: Goal: Ability to demonstrate positive interaction with newborn will improve Outcome: Progressing

## 2020-03-08 NOTE — Lactation Note (Signed)
This note was copied from a baby's chart. Lactation Consultation Note  [name] [id number] Date: 03/08/2020 Reason for consult: Follow-up assessment Age:27 hours   P2 mother whose infant is now 67 hours old.  This is a term baby at 39+2 weeks.  Mother's feeding preference is breast/bottle.  MD in room and completing discharge.  Reviewed breast feeding basics and encouraged mother to latch baby every time to the breast prior to giving any formula supplementation.  Mother concerned that he is not "getting enough."  Reviewed voids/stools.  Talked about the possibility of cluster feeding tonight.  Reminded mother that breast feeding takes patience and practice and that she has only had a short time to practice and learn.  Encouragement provided.  Mother reassured.  She will continue feeding 8-12 times/24 hours or sooner if baby shows feeding cues.  She is uncertain as to whether she will obtain the breast feeding package or the formula package from the [facility] office.  Suggested she call Monday morning.  Mother verbalized understanding.  Mother has our OP phone number for any further questions/concerns.  Father present and holding baby.  Parents happy to be discharged today.   Maternal Data    Feeding    LATCH Score                    Lactation Tools Discussed/Used    Interventions Interventions: Education  Discharge Discharge Education: Engorgement and breast care  Consult Status Consult Status: Complete Date: 03/08/20 Follow-up type: Call as needed    Jean Solis Jean Solis 03/08/2020, 10:34 AM

## 2020-03-08 NOTE — Social Work (Signed)
CSW received and acknowledges consult for EDPS of 9.  Consult screened out due to 9 on EDPS does not warrant a CSW consult.  MOB whom scores are greater than 9/yes to question 10 on Edinburgh Postpartum Depression Screen warrants a CSW consult.   Zackrey Dyar, MSW, LCSWA Clinical Social Work [facility] [phone] 

## 2020-03-14 ENCOUNTER — Other Ambulatory Visit: Payer: Self-pay

## 2020-03-14 ENCOUNTER — Ambulatory Visit: Payer: BLUE CROSS/BLUE SHIELD | Admitting: *Deleted

## 2020-03-14 NOTE — Progress Notes (Signed)
Subjective:  Jean Solis is a 25 y.o. female here for BP check.   Hypertension ROS: taking medications as instructed, no medication side effects noted, no TIA's, no chest pain on exertion, no dyspnea on exertion and no swelling of ankles.    Objective:  BP 130/80   Pulse (!) 105   Appearance alert, well appearing, and in no distress. General exam BP noted to be well controlled today in office.    Assessment:   Blood Pressure well controlled.   Plan:  Current treatment plan is effective, no change in therapy.  Follow up for PP appt.

## 2020-04-21 ENCOUNTER — Encounter: Payer: Self-pay | Admitting: Advanced Practice Midwife

## 2020-04-21 ENCOUNTER — Ambulatory Visit (INDEPENDENT_AMBULATORY_CARE_PROVIDER_SITE_OTHER): Payer: BLUE CROSS/BLUE SHIELD | Admitting: Advanced Practice Midwife

## 2020-04-21 ENCOUNTER — Other Ambulatory Visit: Payer: Self-pay

## 2020-04-21 VITALS — BP 130/85 | HR 88 | Wt 198.0 lb

## 2020-04-21 DIAGNOSIS — Z3009 Encounter for other general counseling and advice on contraception: Secondary | ICD-10-CM

## 2020-04-21 DIAGNOSIS — O99345 Other mental disorders complicating the puerperium: Secondary | ICD-10-CM

## 2020-04-21 DIAGNOSIS — F419 Anxiety disorder, unspecified: Secondary | ICD-10-CM | POA: Insufficient documentation

## 2020-04-21 DIAGNOSIS — F418 Other specified anxiety disorders: Secondary | ICD-10-CM

## 2020-04-21 MED ORDER — BUSPIRONE HCL 5 MG PO TABS: 5.0000 mg | ORAL_TABLET | Freq: Three times a day (TID) | ORAL | 3 refills | Status: DC | PRN

## 2020-04-21 NOTE — Progress Notes (Signed)
Post Partum Visit Note  Jean Solis is a 25 y.o. G36P2002 female who presents for a postpartum visit. She is 6 weeks postpartum following a normal spontaneous vaginal delivery.  I have fully reviewed the prenatal and intrapartum course. The delivery was at 39 gestational weeks.  Anesthesia: epidural. Postpartum course has been unremarkable. Baby is doing well. Baby is feeding by bottle - Daron Offer.. Bleeding currently on cycle onset 04/19/20. Bowel function is normal. Bladder function is normal. Patient is sexually active. Last active x 1 week ago using "Pull out Method" Contraception method is none. Postpartum depression screening: negative. EPDS= 15   The pregnancy intention screening data noted above was reviewed. Potential methods of contraception were discussed. The patient elected to proceed with IUD or IUS.    Edinburgh Postnatal Depression Scale - 04/21/20 1328      Edinburgh Postnatal Depression Scale:  In the Past 7 Days   I have been able to laugh and see the funny side of things. 1    I have looked forward with enjoyment to things. 1    I have blamed myself unnecessarily when things went wrong. 1    I have been anxious or worried for no good reason. 3    I have felt scared or panicky for no good reason. 2    Things have been getting on top of me. 2    I have been so unhappy that I have had difficulty sleeping. 2    I have felt sad or miserable. 2    I have been so unhappy that I have been crying. 1    The thought of harming myself has occurred to me. 0    Edinburgh Postnatal Depression Scale Total 15            The following portions of the patient's history were reviewed and updated as appropriate: allergies, current medications, past family history, past medical history, past social history, past surgical history and problem list.  Review of Systems Pertinent items noted in HPI and remainder of comprehensive ROS otherwise negative.  Objective:  BP 130/85    Pulse 88   Wt 198 lb (89.8 kg)   LMP 04/19/2020   Breastfeeding Yes Comment: *more bottle feeding.   BMI 32.95 kg/m    VS reviewed, nursing note reviewed,  Constitutional: well developed, well nourished, no distress HEENT: normocephalic CV: normal rate Pulm/chest wall: normal effort Abdomen: soft Neuro: alert and oriented x 3 Skin: warm, dry Psych: affect normal   Assessment:   1. Postpartum anxiety --Pt denies any hx anxiety/depression before having the baby --She reports feeling anxious and having what she thinks are panic attacks recently --She denies thoughts of harming herself or others and reports she feels safe  --Rx for Buspar for anxiety and pt to f/u with counseling - Ambulatory referral to [facility]  2. Postpartum care following vaginal delivery --Doing well, no pain or other problems.  3. Encounter for counseling regarding contraception --Pt desires IUD but is having bleeding today and prefers to wait.  Pt has her baby with her today also so will reschedule.  Plan:   Essential components of care per ACOG recommendations:  1.  Mood and well being: Patient with negative depression screening today. Reviewed local resources for support.  - Patient does not use tobacco.  - hx of drug use? No   2. Infant care and feeding:  -Patient currently breastmilk feeding? No  -Social determinants of health (  SDOH) reviewed in EPIC. No concerns  3. Sexuality, contraception and birth spacing - Patient does not want a pregnancy in the next year.   - Reviewed forms of contraception in tiered fashion. Patient desired IUD but will return for placement.      4. Sleep and fatigue -Encouraged family/partner/community support of 4 hrs of uninterrupted sleep to help with mood and fatigue  5. Physical Recovery  - Discussed patients delivery without complications - Patient had no laceration, perineal healing reviewed. Patient expressed understanding - Patient  has urinary incontinence? No - Patient is safe to resume physical and sexual activity but recommend waiting until IUD placement. Use condoms if resume intercourse.  6.  Health Maintenance - Last pap smear done 08/27/19 and was normal with negative HPV.   Sharen Counter, CNM [facility] [employer], [facility]

## 2020-04-29 ENCOUNTER — Encounter: Payer: BLUE CROSS/BLUE SHIELD | Admitting: Licensed Clinical Social Worker

## 2020-04-29 ENCOUNTER — Telehealth: Payer: Self-pay | Admitting: Licensed Clinical Social Worker

## 2020-04-29 NOTE — Telephone Encounter (Signed)
Called pt regarding schedule mychart visit. Left detail message to sign on or call office to reschedule

## 2020-05-12 ENCOUNTER — Ambulatory Visit: Payer: BLUE CROSS/BLUE SHIELD | Admitting: Advanced Practice Midwife

## 2020-05-12 NOTE — Progress Notes (Deleted)
GYNECOLOGY OFFICE PROCEDURE NOTE  Jean Solis is a 25 y.o. [phone] here for [place] IUD insertion. No GYN concerns.  Last pap smear was on *** and was normal.  IUD Insertion Procedure Note Patient identified, informed consent performed, consent signed.   Discussed risks of irregular bleeding, cramping, infection, malpositioning or misplacement of the IUD outside the uterus which may require further procedure such as laparoscopy. Time out was performed.  Urine pregnancy test negative.  Speculum placed in the vagina.  Cervix visualized.  Cleaned with Betadine x 2.  Grasped anteriorly with a single tooth tenaculum.  Uterus sounded to *** cm.  Liletta IUD placed per manufacturer's recommendations.  Strings trimmed to 3 cm. Tenaculum was removed, good hemostasis noted.  Patient tolerated procedure well.   Patient was given post-procedure instructions.  She was advised to have backup contraception for one week.  Patient was also asked to check IUD strings periodically and follow up in 4 weeks for IUD check.

## 2020-05-20 ENCOUNTER — Ambulatory Visit: Payer: BLUE CROSS/BLUE SHIELD | Admitting: Obstetrics

## 2020-05-23 ENCOUNTER — Encounter (HOSPITAL_COMMUNITY): Payer: Self-pay | Admitting: Obstetrics and Gynecology

## 2020-05-23 ENCOUNTER — Inpatient Hospital Stay (HOSPITAL_COMMUNITY): Payer: BLUE CROSS/BLUE SHIELD

## 2020-05-23 ENCOUNTER — Inpatient Hospital Stay (HOSPITAL_COMMUNITY)
Admission: AD | Admit: 2020-05-23 | Discharge: 2020-05-23 | Disposition: A | Discharge status: Home / Self Care | Payer: BLUE CROSS/BLUE SHIELD | Attending: Obstetrics & Gynecology | Admitting: Obstetrics & Gynecology

## 2020-05-23 ENCOUNTER — Other Ambulatory Visit: Payer: Self-pay

## 2020-05-23 DIAGNOSIS — R1030 Lower abdominal pain, unspecified: Secondary | ICD-10-CM | POA: Diagnosis not present

## 2020-05-23 DIAGNOSIS — O3680X0 Pregnancy with inconclusive fetal viability, not applicable or unspecified: Secondary | ICD-10-CM | POA: Diagnosis not present

## 2020-05-23 DIAGNOSIS — Z3A00 Weeks of gestation of pregnancy not specified: Secondary | ICD-10-CM | POA: Diagnosis not present

## 2020-05-23 DIAGNOSIS — O26899 Other specified pregnancy related conditions, unspecified trimester: Secondary | ICD-10-CM | POA: Insufficient documentation

## 2020-05-23 LAB — CBC WITH DIFFERENTIAL/PLATELET
Abs Immature Granulocytes: 0.01 10*3/uL (ref 0.00–0.07)
Basophils Absolute: 0 10*3/uL (ref 0.0–0.1)
Basophils Relative: 0 %
Eosinophils Absolute: 0.1 10*3/uL (ref 0.0–0.5)
Eosinophils Relative: 2 %
HCT: 38.1 % (ref 36.0–46.0)
Hemoglobin: 12.4 g/dL (ref 12.0–15.0)
Immature Granulocytes: 0 %
Lymphocytes Relative: 25 %
Lymphs Abs: 1.9 10*3/uL (ref 0.7–4.0)
MCH: 27.3 pg (ref 26.0–34.0)
MCHC: 32.5 g/dL (ref 30.0–36.0)
MCV: 83.9 fL (ref 80.0–100.0)
Monocytes Absolute: 0.5 10*3/uL (ref 0.1–1.0)
Monocytes Relative: 6 %
Neutro Abs: 5.1 10*3/uL (ref 1.7–7.7)
Neutrophils Relative %: 67 %
Platelets: 340 10*3/uL (ref 150–400)
RBC: 4.54 MIL/uL (ref 3.87–5.11)
RDW: 15.3 % (ref 11.5–15.5)
WBC: 7.5 10*3/uL (ref 4.0–10.5)
nRBC: 0 % (ref 0.0–0.2)

## 2020-05-23 LAB — COMPREHENSIVE METABOLIC PANEL
ALT: 14 U/L (ref 0–44)
AST: 19 U/L (ref 15–41)
Albumin: 4.1 g/dL (ref 3.5–5.0)
Alkaline Phosphatase: 74 U/L (ref 38–126)
Anion gap: 6 (ref 5–15)
BUN: 7 mg/dL (ref 6–20)
CO2: 26 mmol/L (ref 22–32)
Calcium: 9.3 mg/dL (ref 8.9–10.3)
Chloride: 106 mmol/L (ref 98–111)
Creatinine, Ser: 0.7 mg/dL (ref 0.44–1.00)
GFR, Estimated: >60 mL/min (ref >60)
Glucose, Bld: 103 mg/dL — ABNORMAL HIGH (ref 70–99)
Potassium: 3.4 mmol/L — ABNORMAL LOW (ref 3.5–5.1)
Sodium: 138 mmol/L (ref 135–145)
Total Bilirubin: 0.8 mg/dL (ref 0.3–1.2)
Total Protein: 7.2 g/dL (ref 6.5–8.1)

## 2020-05-23 LAB — URINALYSIS, ROUTINE W REFLEX MICROSCOPIC
Bilirubin Urine: NEGATIVE
Glucose, UA: NEGATIVE mg/dL
Hgb urine dipstick: NEGATIVE
Ketones, ur: NEGATIVE mg/dL
Nitrite: NEGATIVE
Protein, ur: NEGATIVE mg/dL
Specific Gravity, Urine: 1.021 (ref 1.005–1.030)
pH: 5 (ref 5.0–8.0)

## 2020-05-23 LAB — WET PREP, GENITAL
Clue Cells Wet Prep HPF POC: NONE SEEN
Sperm: NONE SEEN
Trich, Wet Prep: NONE SEEN
Yeast Wet Prep HPF POC: NONE SEEN

## 2020-05-23 LAB — HCG, QUANTITATIVE, PREGNANCY: hCG, Beta Chain, Quant, S: 1790 m[IU]/mL — ABNORMAL HIGH (ref <5)

## 2020-05-23 LAB — POCT PREGNANCY, URINE: Preg Test, Ur: POSITIVE — AB

## 2020-05-23 LAB — HIV ANTIBODY (ROUTINE TESTING W REFLEX): HIV Screen 4th Generation wRfx: NONREACTIVE

## 2020-05-23 NOTE — Discharge Instructions (Signed)
Activity Restriction During Pregnancy Your health care provider may recommend specific activity restrictions during pregnancy for a variety of reasons. Activity restriction may require that you limit activities that require great effort, such as exercise, lifting, or sex. The type of activity restriction will vary for each person, depending on your risk or the problems you are having. Activity restriction may be recommended for a period of time until your baby is delivered. Why are activity restrictions recommended? Activity restriction may be recommended if:  Your placenta is partially or completely covering the opening of your cervix (placenta previa).  There is bleeding between the wall of the uterus and the amniotic sac in the first trimester of pregnancy (subchorionic hemorrhage).  You went into labor too early (preterm labor).  You have a history of miscarriage.  You have a condition that causes high blood pressure during pregnancy (preeclampsia or eclampsia).  You are pregnant with more than one baby.  Your baby is not growing well. What are the risks? The risks depend on your specific restriction. Strict bed rest has the most physical and emotional risks and is no longer routinely recommended. Risks of strict bed rest include:  Loss of muscle conditioning from not moving.  Blood clots.  Social isolation.  Depression.  Loss of income. Talk with your health care team about activity restriction to decide if it is best for you and your baby. Even if you are having problems during your pregnancy, you may be able to continue with normal levels of activity with careful monitoring by your health care team. Follow these instructions at home: If needed, based on your overall health and the health of your baby, your health care provider will decide which type of activity restriction is right for you. Activity restrictions may include:  Not lifting anything heavier than 10 pounds (4.5  kg).  Avoiding activities that take a lot of physical effort.  No lifting or straining.  Resting in a sitting position or lying down for periods of time during the day. Pelvic rest may be recommended along with activity restrictions. If pelvic rest is recommended, then:  Do not have sex, an orgasm, or use sexual stimulation.  Do not use tampons. Do not douche. Do not put anything into your vagina.  Do not lift anything that is heavier than 10 lb (4.5 kg).  Avoid activities that require a lot of effort.  Avoid any activity in which your pelvic muscles could become strained, such as squatting.   Questions to ask your health care provider  Why is my activity being limited?  How will activity restrictions affect my body?  Why is rest helpful for me and my baby?  What activities can I do?  When can I return to normal activities? When should I seek immediate medical care? Seek immediate medical care if you have:  Vaginal bleeding.  Vaginal discharge.  Cramping pain in your lower abdomen.  Regular contractions.  A low, dull backache. Summary  Your health care provider may recommend specific activity restrictions during pregnancy for a variety of reasons.  Activity restriction may require that you limit activities such as exercise, lifting, sex, or any other activity that requires great effort.  Discuss the risks and benefits of activity restriction with your health care team to decide if it is best for you and your baby.  Contact your health care provider right away if you think you are having contractions, or if you notice vaginal bleeding, discharge, or cramping. This information   is not intended to replace advice given to you by your health care provider. Make sure you discuss any questions you have with your health care provider. Document Revised: 09/20/2018 Document Reviewed: 04/19/2017 Elsevier Patient Education  2021 [employer] Inc.  

## 2020-05-23 NOTE — MAU Note (Signed)
Presents with c/o spotting, unsure if pregnant, requesting pregnancy test.  Hasn't taken HPT.  Reports lower abdominal "discomfort".

## 2020-05-23 NOTE — MAU Provider Note (Signed)
History     CSN: [id number]  Arrival date and time: 05/23/20 1523   None     Chief Complaint  Patient presents with  . Possible Pregnancy   HPI  [name] is a 25 y.o. female [phone] @ Unknown here in MAU with concerns about pregnancy, requesting a pregnancy test.  She Reports some discomfort in her lower belly over the course of using boric acid for recurrent BV. Reports throwing up 2 x a few weeks ago and then became concerned that she might be pregnant. She did not have a cycle last month which is what brought her in.  She has no pain at this time 0/10. No bleeding.   Here with concerns about pregnancy.  [facility].   OB History    Gravida  3   Para  2   Term  2   Preterm  0   AB  0   Living  2     SAB  0   IAB  0   Ectopic  0   Multiple  0   Live Births  2           Past Medical History:  Diagnosis Date  . Chlamydia 09/2011  . Headache   . STD (sexually transmitted disease)     Past Surgical History:  Procedure Laterality Date  . BARTHOLIN CYST MARSUPIALIZATION Right 2012  . TONSILLECTOMY      Family History  Problem Relation Age of Onset  . Hypertension Mother   . Hyperlipidemia Mother   . Hyperlipidemia Father   . Hypertension Father   . Hypertension Maternal Grandmother   . Diabetes Maternal Uncle     Social History   Tobacco Use  . Smoking status: Never Smoker  . Smokeless tobacco: Never Used  Vaping Use  . Vaping Use: Never used  Substance Use Topics  . Alcohol use: Not Currently    Alcohol/week: 1.0 standard drink    Types: 1 Standard drinks or equivalent per week  . Drug use: No    Allergies: No Known Allergies  Medications Prior to Admission  Medication Sig Dispense Refill Last Dose  . acetaminophen (TYLENOL) 500 MG tablet Take 2 tablets (1,000 mg total) by mouth every 6 (six) hours as needed. (Patient not taking: Reported on 04/21/2020)     . busPIRone (BUSPAR) 5 MG tablet Take 1-2 tablets (5-10 mg  total) by mouth 3 (three) times daily as needed. Start with 5 mg, then increase to 10 mg each dose as needed 20 tablet 3   . coconut oil OIL Apply 1 application topically as needed. (Patient not taking: Reported on 04/21/2020)  0   . ibuprofen (ADVIL) 600 MG tablet Take 1 tablet (600 mg total) by mouth every 6 (six) hours. (Patient not taking: Reported on 04/21/2020)     . Prenat-Fe Poly-Methfol-FA-DHA (VITAFOL ULTRA) 29-0.6-0.4-200 MG CAPS Take 1 tablet by mouth daily. (Patient not taking: Reported on 04/21/2020) 30 capsule 12    Results for orders placed or performed during the hospital encounter of 05/23/20 (from the past 72 hour(s))  Pregnancy, urine POC     Status: Abnormal   Collection Time: 05/23/20  4:01 PM  Result Value Ref Range   Preg Test, Ur POSITIVE (A) NEGATIVE    Comment:        THE SENSITIVITY OF THIS METHODOLOGY IS >24 mIU/mL   Urinalysis, Routine w reflex microscopic Urine, Clean Catch     Status: Abnormal   Collection  Time: 05/23/20  4:06 PM  Result Value Ref Range   Color, Urine YELLOW YELLOW   APPearance HAZY (A) CLEAR   Specific Gravity, Urine 1.021 1.005 - 1.030   pH 5.0 5.0 - 8.0   Glucose, UA NEGATIVE NEGATIVE mg/dL   Hgb urine dipstick NEGATIVE NEGATIVE   Bilirubin Urine NEGATIVE NEGATIVE   Ketones, ur NEGATIVE NEGATIVE mg/dL   Protein, ur NEGATIVE NEGATIVE mg/dL   Nitrite NEGATIVE NEGATIVE   Leukocytes,Ua MODERATE (A) NEGATIVE   RBC / HPF 0-5 0 - 5 RBC/hpf   WBC, UA 11-20 0 - 5 WBC/hpf   Bacteria, UA RARE (A) NONE SEEN   Squamous Epithelial / LPF 0-5 0 - 5   Mucus PRESENT     Comment: Performed at [facility], [address] [place], [place] [address]  CBC with Differential/Platelet     Status: None   Collection Time: 05/23/20  4:49 PM  Result Value Ref Range   WBC 7.5 4.0 - 10.5 K/uL   RBC 4.54 3.87 - 5.11 MIL/uL   Hemoglobin 12.4 12.0 - 15.0 g/dL   HCT 33.238.1 95.136.0 - 88.446.0 %   MCV 83.9 80.0 - 100.0 fL   MCH 27.3 26.0 - 34.0 pg   MCHC 32.5 30.0  - 36.0 g/dL   RDW 16.615.3 06.311.5 - 01.615.5 %   Platelets 340 150 - 400 K/uL   nRBC 0.0 0.0 - 0.2 %   Neutrophils Relative % 67 %   Neutro Abs 5.1 1.7 - 7.7 K/uL   Lymphocytes Relative 25 %   Lymphs Abs 1.9 0.7 - 4.0 K/uL   Monocytes Relative 6 %   Monocytes Absolute 0.5 0.1 - 1.0 K/uL   Eosinophils Relative 2 %   Eosinophils Absolute 0.1 0.0 - 0.5 K/uL   Basophils Relative 0 %   Basophils Absolute 0.0 0.0 - 0.1 K/uL   Immature Granulocytes 0 %   Abs Immature Granulocytes 0.01 0.00 - 0.07 K/uL    Comment: Performed at [facility], [address] [place], [place] [address]  Comprehensive metabolic panel     Status: Abnormal   Collection Time: 05/23/20  4:49 PM  Result Value Ref Range   Sodium 138 135 - 145 mmol/L   Potassium 3.4 (L) 3.5 - 5.1 mmol/L   Chloride 106 98 - 111 mmol/L   CO2 26 22 - 32 mmol/L   Glucose, Bld 103 (H) 70 - 99 mg/dL    Comment: Glucose reference range applies only to samples taken after fasting for at least 8 hours.   BUN 7 6 - 20 mg/dL   Creatinine, Ser 2.350.70 0.44 - 1.00 mg/dL   Calcium 9.3 8.9 - 57.310.3 mg/dL   Total Protein 7.2 6.5 - 8.1 g/dL   Albumin 4.1 3.5 - 5.0 g/dL   AST 19 15 - 41 U/L   ALT 14 0 - 44 U/L   Alkaline Phosphatase 74 38 - 126 U/L   Total Bilirubin 0.8 0.3 - 1.2 mg/dL   GFR, Estimated >22>60 >02>60 mL/min    Comment: (NOTE) Calculated using the CKD-EPI Creatinine Equation (2021)    Anion gap 6 5 - 15    Comment: Performed at [facility], [address] [place], [place] [address]  hCG, quantitative, pregnancy     Status: Abnormal   Collection Time: 05/23/20  4:49 PM  Result Value Ref Range   hCG, Beta Chain, Quant, S 1,790 (H) <5 mIU/mL    Comment:  GEST. AGE      CONC.  (mIU/mL)   <=1 WEEK        5 - 50     2 WEEKS       50 - 500     3 WEEKS       100 - 10,000     4 WEEKS     1,000 - 30,000     5 WEEKS     3,500 - 115,000   6-8 WEEKS     12,000 - 270,000    12 WEEKS     15,000 - 220,000        FEMALE AND  NON-PREGNANT FEMALE:     LESS THAN 5 mIU/mL Performed at [facility], [address], [place], [place] [address]   HIV Antibody (routine testing w rflx)     Status: None   Collection Time: 05/23/20  4:49 PM  Result Value Ref Range   HIV Screen 4th Generation wRfx Non Reactive Non Reactive    Comment: Performed at [facility], [address] [place], [place] [address]  Wet prep, genital     Status: Abnormal   Collection Time: 05/23/20  6:18 PM   Specimen: PATH Cytology Cervicovaginal Ancillary Only  Result Value Ref Range   Yeast Wet Prep HPF POC NONE SEEN NONE SEEN   Trich, Wet Prep NONE SEEN NONE SEEN   Clue Cells Wet Prep HPF POC NONE SEEN NONE SEEN   WBC, Wet Prep HPF POC FEW (A) NONE SEEN   Sperm NONE SEEN     Comment: Performed at [facility], [address] [place], [place] [address]   US OB LESS THAN 14 WEEKS WITH OB TRANSVAGINAL  Result Date: 05/23/2020 CLINICAL DATA:  Initial evaluation for acute pain, positive beta HCG. EXAM: OBSTETRIC <14 WK [place] AND TRANSVAGINAL OB US TECHNIQUE: Both transabdominal and transvaginal ultrasound examinations were performed for complete evaluation of the gestation as well as the maternal uterus, adnexal regions, and pelvic cul-de-sac. Transvaginal technique was performed to assess early pregnancy. COMPARISON:  None available. FINDINGS: Intrauterine gestational sac: Negative. Yolk sac:  Negative. Embryo:  Negative. Cardiac Activity: Negative. Subchorionic hemorrhage:  None visualized. Maternal uterus/adnexae: Ovaries within normal limits. No adnexal mass. Trace free fluid within the pelvis. IMPRESSION: 1. Early pregnancy with no discrete IUP or adnexal mass identified. Finding is consistent with a pregnancy of unknown anatomic location. Differential considerations include IUP to early to visualize, recent SAB, or possibly occult ectopic pregnancy. Close clinical monitoring with serial beta HCGs and close interval follow-up ultrasound  recommended as clinically warranted. 2. No other acute maternal uterine or adnexal abnormality. Electronically Signed   By: Rise Mu M.D.   On: 05/23/2020 19:16   Review of Systems  Constitutional: Negative for fever.  Gastrointestinal: Negative for abdominal pain.  Genitourinary: Negative for vaginal bleeding.   Physical Exam   Blood pressure 124/76, pulse (!) 107, temperature 98.3 F (36.8 C), temperature source Oral, resp. rate 19, height 5\' 5"  (1.651 m), weight 89.8 kg, SpO2 99 %, currently breastfeeding.  Physical Exam Constitutional:      General: She is not in acute distress.    Appearance: Normal appearance. She is not ill-appearing, toxic-appearing or diaphoretic.  Abdominal:     General: There is no distension.     Palpations: Abdomen is soft.     Tenderness: There is no abdominal tenderness.  Musculoskeletal:        General: Normal  range of motion.  Skin:    General: Skin is warm.  Neurological:     Mental Status: She is alert and oriented to person, place, and time.  Psychiatric:        Mood and Affect: Mood normal.    MAU Course  Procedures  None  MDM  Wet prep & GC HIV, CBC, Hcg, ABO US OB transvaginal   Assessment and Plan   A:  1. Pregnancy of unknown anatomic location   2. Abdominal pain affecting pregnancy     P:  Discharge home in stable condition Ectopic precautions Return to [facility] on Monday @ 0800 for stat quant Return to MAU if symptoms worsen Pelvic rest  Avigdor Dollar, Harolyn Rutherford, NP 05/24/2020 8:09 PM

## 2020-05-26 ENCOUNTER — Other Ambulatory Visit: Payer: Self-pay

## 2020-05-26 ENCOUNTER — Other Ambulatory Visit: Payer: PRIVATE HEALTH INSURANCE

## 2020-05-26 ENCOUNTER — Other Ambulatory Visit: Payer: BLUE CROSS/BLUE SHIELD

## 2020-05-26 VITALS — BP 124/79 | HR 72

## 2020-05-26 DIAGNOSIS — O3680X0 Pregnancy with inconclusive fetal viability, not applicable or unspecified: Secondary | ICD-10-CM

## 2020-05-26 LAB — GC/CHLAMYDIA PROBE AMP (~~LOC~~) NOT AT ARMC
Chlamydia: NEGATIVE
Comment: NEGATIVE
Comment: NORMAL
Neisseria Gonorrhea: NEGATIVE

## 2020-05-26 LAB — BETA HCG QUANT (REF LAB): hCG Quant: 3467 m[IU]/mL

## 2020-05-26 NOTE — Progress Notes (Addendum)
Patient is here for follow-up stat quant. Patient had a positive pregnancy test and quant hcg in MAU on 05/23/2020. Patient does not report any discomfort or bleeding at this time. She does not remember having a cycle last month.Pain is 0/10 at this time.  HCG drawn at 8:12am  Patient can expect a phone call  today regarding results and recommendations. Patient voice understanding.

## 2020-05-29 ENCOUNTER — Other Ambulatory Visit: Payer: Self-pay

## 2020-05-29 ENCOUNTER — Ambulatory Visit (HOSPITAL_BASED_OUTPATIENT_CLINIC_OR_DEPARTMENT_OTHER)
Admission: RE | Admit: 2020-05-29 | Discharge: 2020-05-29 | Disposition: A | Discharge status: Home / Self Care | Payer: BLUE CROSS/BLUE SHIELD | Source: Ambulatory Visit | Attending: Obstetrics & Gynecology | Admitting: Obstetrics & Gynecology

## 2020-05-29 DIAGNOSIS — O3680X0 Pregnancy with inconclusive fetal viability, not applicable or unspecified: Secondary | ICD-10-CM | POA: Insufficient documentation

## 2020-06-04 ENCOUNTER — Other Ambulatory Visit: Payer: Self-pay

## 2020-06-04 DIAGNOSIS — O3680X0 Pregnancy with inconclusive fetal viability, not applicable or unspecified: Secondary | ICD-10-CM

## 2020-06-12 ENCOUNTER — Ambulatory Visit
Admission: RE | Admit: 2020-06-12 | Discharge: 2020-06-12 | Disposition: A | Discharge status: Home / Self Care | Payer: BLUE CROSS/BLUE SHIELD | Source: Ambulatory Visit | Attending: Obstetrics & Gynecology | Admitting: Obstetrics & Gynecology

## 2020-06-12 ENCOUNTER — Other Ambulatory Visit: Payer: Self-pay

## 2020-06-12 ENCOUNTER — Ambulatory Visit (INDEPENDENT_AMBULATORY_CARE_PROVIDER_SITE_OTHER): Payer: BLUE CROSS/BLUE SHIELD

## 2020-06-12 VITALS — BP 122/71 | HR 79 | Wt 197.1 lb

## 2020-06-12 DIAGNOSIS — O3680X0 Pregnancy with inconclusive fetal viability, not applicable or unspecified: Secondary | ICD-10-CM | POA: Diagnosis not present

## 2020-06-12 DIAGNOSIS — Z712 Person consulting for explanation of examination or test findings: Secondary | ICD-10-CM

## 2020-06-13 NOTE — Progress Notes (Signed)
Here today for results following dating [place]. Results reviewed with Vergie Living, MD who finds single, living IUP at 7w 3d. Recommends pt begin prenatal care as planned at [facility]. Results and provider recommendation reviewed with patient. [place] did show small subchorionic hemorrhage. Explained this is not harmful to patient or baby, but pt may experience some spotting. Return precautions reviewed; pt to go to MAU for any bleeding like a period. Pt verbalizes understanding.  Fleet Contras RN 06/12/20

## 2020-06-27 NOTE — Progress Notes (Signed)
Patient was assessed and managed by nursing staff during this encounter. I have reviewed the chart and agree with the documentation and plan. I have also made any necessary editorial changes.  Maelee Hoot, MD 06/27/2020 3:18 PM   

## 2020-07-21 ENCOUNTER — Other Ambulatory Visit: Payer: Self-pay

## 2020-07-21 ENCOUNTER — Ambulatory Visit (INDEPENDENT_AMBULATORY_CARE_PROVIDER_SITE_OTHER): Payer: Medicaid Other | Admitting: Obstetrics and Gynecology

## 2020-07-21 ENCOUNTER — Other Ambulatory Visit (HOSPITAL_COMMUNITY)
Admission: RE | Admit: 2020-07-21 | Discharge: 2020-07-21 | Disposition: A | Discharge status: Home / Self Care | Payer: Medicaid Other | Source: Ambulatory Visit | Attending: Obstetrics and Gynecology | Admitting: Obstetrics and Gynecology

## 2020-07-21 ENCOUNTER — Encounter: Payer: Self-pay | Admitting: Obstetrics and Gynecology

## 2020-07-21 VITALS — BP 125/83 | HR 108 | Wt 199.5 lb

## 2020-07-21 DIAGNOSIS — Z3A13 13 weeks gestation of pregnancy: Secondary | ICD-10-CM | POA: Diagnosis not present

## 2020-07-21 DIAGNOSIS — F418 Other specified anxiety disorders: Secondary | ICD-10-CM

## 2020-07-21 DIAGNOSIS — Z3480 Encounter for supervision of other normal pregnancy, unspecified trimester: Secondary | ICD-10-CM | POA: Diagnosis not present

## 2020-07-21 DIAGNOSIS — O09899 Supervision of other high risk pregnancies, unspecified trimester: Secondary | ICD-10-CM | POA: Insufficient documentation

## 2020-07-21 DIAGNOSIS — O99345 Other mental disorders complicating the puerperium: Secondary | ICD-10-CM

## 2020-07-21 MED ORDER — VALACYCLOVIR HCL 500 MG PO TABS: 500.0000 mg | ORAL_TABLET | Freq: Two times a day (BID) | ORAL | 6 refills | Status: DC

## 2020-07-21 NOTE — Progress Notes (Signed)
  Subjective:    Jean Solis is a 89 [redacted]w[redacted]d being seen today for her first obstetrical visit.  Her obstetrical history is significant for short interval between pregnancies with last delivery in 02/2020. Patient does intend to breast feed. Pregnancy history fully reviewed.  Patient reports no problems.  Vitals:   07/21/20 0926  BP: 125/83  Pulse: (!) 108  Weight: 199 lb 8 oz (90.5 kg)    HISTORY: OB History  Gravida Para Term Preterm AB Living  3 2 2  0 0 2  SAB IAB Ectopic Multiple Live Births  0 0 0 0 2    # Outcome Date GA Lbr Len/2nd Weight Sex Delivery Anes PTL Lv  3 Current           2 Term 03/07/20 [redacted]w[redacted]d 03:41 / 00:09 7 lb 15.5 oz (3.615 kg) M Vag-Spont EPI  LIV     Birth Comments: wnl  1 Term 10/11/17 [redacted]w[redacted]d 14:20 / 00:18 7 lb 3.3 oz (3.269 kg) F Vag-Spont EPI  LIV   Past Medical History:  Diagnosis Date   Chlamydia 09/2011   Headache    STD (sexually transmitted disease)    Past Surgical History:  Procedure Laterality Date   BARTHOLIN CYST MARSUPIALIZATION Right 2012   TONSILLECTOMY     Family History  Problem Relation Age of Onset   Hypertension Mother    Hyperlipidemia Mother    Hyperlipidemia Father    Hypertension Father    Hypertension Maternal Grandmother    Diabetes Maternal Uncle      Exam    Uterus:     Pelvic Exam:    Perineum: No Hemorrhoids   Vulva: normal   Vagina:  normal mucosa, normal discharge   pH:    Cervix: Closed and long   Adnexa: normal   Bony Pelvis: gynecoid  System: Breast:  Symetric and equal in size. No palpable mass or nipple discharge   Skin: normal coloration and turgor, no rashes    Neurologic: oriented, no focal deficits   Extremities: normal strength, tone, and muscle mass   HEENT    Mouth/Teeth mucous membranes moist, pharynx normal without lesions and dental hygiene good   Neck supple and no masses   Cardiovascular: regular rate and rhythm   Respiratory:  appears well, vitals normal, no respiratory  distress, acyanotic, normal RR, ear and throat exam is normal, neck free of mass or lymphadenopathy, chest clear, no wheezing, crepitations, rhonchi, normal symmetric air entry   Abdomen: Soft, non tender   Urinary:       Assessment:    Pregnancy: 2013 Patient Active Problem List   Diagnosis Date Noted   Short interval between pregnancies affecting pregnancy, antepartum 07/21/2020   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 Prenatal vitamins. Problem list reviewed and updated. Genetic Screening discussed : Panorama ordered.  Ultrasound discussed; fetal survey: Ordered.  Follow up in 4 weeks. 50% of 30 min visit spent on counseling and coordination of care.     Jedidiah Demartini 07/21/2020

## 2020-07-21 NOTE — Progress Notes (Signed)
New OB 13wks Report vaginal discharge, yellow, no odor or irritation. Reports mid abdominal pain.

## 2020-07-21 NOTE — Patient Instructions (Signed)
Obstetrics: Normal and Problem Pregnancies (7th ed., pp. 102-121). [place], [place]: Elsevier."> Textbook of Family Medicine (9th ed., pp. 365-410). [place], [place]: [employer].">  First Trimester of Pregnancy  The first trimester of pregnancy starts on the first day of your last menstrual period until the end of week 12. This is months 1 through 3 of pregnancy. A week after a sperm fertilizes an egg, the egg will implant into the wall of the uterus and begin to develop into a baby. By the end of 12 weeks, all the baby'sorgans will be formed and the baby will be 2-3 inches in size. Body changes during your first trimester Your body goes through many changes during pregnancy. The changes vary andgenerally return to normal after your baby is born. Physical changes You may gain or lose weight. Your breasts may begin to grow larger and become tender. The tissue that surrounds your nipples (areola) may become darker. Dark spots or blotches (chloasma or mask of pregnancy) may develop on your face. You may have changes in your hair. These can include thickening or thinning of your hair or changes in texture. Health changes You may feel nauseous, and you may vomit. You may have heartburn. You may develop headaches. You may develop constipation. Your gums may bleed and may be sensitive to brushing and flossing. Other changes You may tire easily. You may urinate more often. Your menstrual periods will stop. You may have a loss of appetite. You may develop cravings for certain kinds of food. You may have changes in your emotions from day to day. You may have more vivid and strange dreams. Follow these instructions at home: Medicines Follow your health care provider's instructions regarding medicine use. Specific medicines may be either safe or unsafe to take during pregnancy. Do not take any medicines unless told to by your health care provider. Take a prenatal vitamin that contains at least  600 micrograms (mcg) of folic acid. Eating and drinking Eat a healthy diet that includes fresh fruits and vegetables, whole grains, good sources of protein such as meat, eggs, or tofu, and low-fat dairy products. Avoid raw meat and unpasteurized juice, milk, and cheese. These carry germs that can harm you and your baby. If you feel nauseous or you vomit: Eat 4 or 5 small meals a day instead of 3 large meals. Try eating a few soda crackers. Drink liquids between meals instead of during meals. You may need to take these actions to prevent or treat constipation: Drink enough fluid to keep your urine pale yellow. Eat foods that are high in fiber, such as beans, whole grains, and fresh fruits and vegetables. Limit foods that are high in fat and processed sugars, such as fried or sweet foods. Activity Exercise only as directed by your health care provider. Most people can continue their usual exercise routine during pregnancy. Try to exercise for 30 minutes at least 5 days a week. Stop exercising if you develop pain or cramping in the lower abdomen or lower back. Avoid exercising if it is very hot or humid or if you are at high altitude. Avoid heavy lifting. If you choose to, you may have sex unless your health care provider tells you not to. Relieving pain and discomfort Wear a good support bra to relieve breast tenderness. Rest with your legs elevated if you have leg cramps or low back pain. If you develop bulging veins (varicose veins) in your legs: Wear support hose as told by your health care provider. Elevate   your feet for 15 minutes, 3-4 times a day. Limit salt in your diet. Safety Wear your seat belt at all times when driving or riding in a car. Talk with your health care provider if someone is verbally or physically abusive to you. Talk with your health care provider if you are feeling sad or have thoughts of hurting yourself. Lifestyle Do not use hot tubs, steam rooms, or  saunas. Do not douche. Do not use tampons or scented sanitary pads. Do not use herbal remedies, alcohol, illegal drugs, or medicines that are not approved by your health care provider. Chemicals in these products can harm your baby. Do not use any products that contain nicotine or tobacco, such as cigarettes, e-cigarettes, and chewing tobacco. If you need help quitting, ask your health care provider. Avoid cat litter boxes and soil used by cats. These carry germs that can cause birth defects in the baby and possibly loss of the unborn baby (fetus) by miscarriage or stillbirth. General instructions During routine prenatal visits in the first trimester, your health care provider will do a physical exam, perform necessary tests, and ask you how things are going. Keep all follow-up visits. This is important. Ask for help if you have counseling or nutritional needs during pregnancy. Your health care provider can offer advice or refer you to specialists for help with various needs. Schedule a dentist appointment. At home, brush your teeth with a soft toothbrush. Floss gently. Write down your questions. Take them to your prenatal visits. Where to find more information American Pregnancy Association: americanpregnancy.org [employer]: https://www.todd-brady.net/ Office on [employer] Health: MightyReward[employer].nz Contact a health care provider if you have: Dizziness. A fever. Mild pelvic cramps, pelvic pressure, or nagging pain in the abdominal area. Nausea, vomiting, or diarrhea that lasts for 24 hours or longer. A bad-smelling vaginal discharge. Pain when you urinate. Known exposure to a contagious illness, such as chickenpox, measles, Zika virus, HIV, or hepatitis. Get help right away if you have: Spotting or bleeding from your vagina. Severe abdominal cramping or pain. Shortness of breath or chest pain. Any kind of trauma, such as from a fall  or a car crash. New or increased pain, swelling, or redness in an arm or leg. Summary The first trimester of pregnancy starts on the first day of your last menstrual period until the end of week 12 (months 1 through 3). Eating 4 or 5 small meals a day rather than 3 large meals may help to relieve nausea and vomiting. Do not use any products that contain nicotine or tobacco, such as cigarettes, e-cigarettes, and chewing tobacco. If you need help quitting, ask your health care provider. Keep all follow-up visits. This is important. This information is not intended to replace advice given to you by your health care provider. Make sure you discuss any questions you have with your healthcare provider. Document Revised: 06/06/2019 Document Reviewed: 04/12/2019 Elsevier Patient Education  2022 [employer].  Second Trimester of Pregnancy  The second trimester of pregnancy is from week 13 through week 27. This is months 4 through 6 of pregnancy. The second trimester is often a time when you feel your best. Your body has adjusted to being pregnant, and you begin to feelbetter physically. During the second trimester: Morning sickness has lessened or stopped completely. You may have more energy. You may have an increase in appetite. The second trimester is also a time when the unborn baby (fetus) is growing rapidly. At the end  of the sixth month, the fetus may be up to 12 inches long and weigh about 1 pounds. You will likely begin to feel the baby move (quickening) between 16 and 20 weeks of pregnancy. Body changes during your second trimester Your body continues to go through many changes during your second trimester.The changes vary and generally return to normal after the baby is born. Physical changes Your weight will continue to increase. You will notice your lower abdomen bulging out. You may begin to get stretch marks on your hips, abdomen, and breasts. Your breasts will continue to grow and to  become tender. Dark spots or blotches (chloasma or mask of pregnancy) may develop on your face. A dark line from your belly button to the pubic area (linea nigra) may appear. You may have changes in your hair. These can include thickening of your hair, rapid growth, and changes in texture. Some people also have hair loss during or after pregnancy, or hair that feels dry or thin. Health changes You may develop headaches. You may have heartburn. You may develop constipation. You may develop hemorrhoids or swollen, bulging veins (varicose veins). Your gums may bleed and may be sensitive to brushing and flossing. You may urinate more often because the fetus is pressing on your bladder. You may have back pain. This is caused by: Weight gain. Pregnancy hormones that are relaxing the joints in your pelvis. A shift in weight and the muscles that support your balance. Follow these instructions at home: Medicines Follow your health care provider's instructions regarding medicine use. Specific medicines may be either safe or unsafe to take during pregnancy. Do not take any medicines unless approved by your health care provider. Take a prenatal vitamin that contains at least 600 micrograms (mcg) of folic acid. Eating and drinking Eat a healthy diet that includes fresh fruits and vegetables, whole grains, good sources of protein such as meat, eggs, or tofu, and low-fat dairy products. Avoid raw meat and unpasteurized juice, milk, and cheese. These carry germs that can harm you and your baby. You may need to take these actions to prevent or treat constipation: Drink enough fluid to keep your urine pale yellow. Eat foods that are high in fiber, such as beans, whole grains, and fresh fruits and vegetables. Limit foods that are high in fat and processed sugars, such as fried or sweet foods. Activity Exercise only as directed by your health care provider. Most people can continue their usual exercise  routine during pregnancy. Try to exercise for 30 minutes at least 5 days a week. Stop exercising if you develop contractions in your uterus. Stop exercising if you develop pain or cramping in the lower abdomen or lower back. Avoid exercising if it is very hot or humid or if you are at a high altitude. Avoid heavy lifting. If you choose to, you may have sex unless your health care provider tells you not to. Relieving pain and discomfort Wear a supportive bra to prevent discomfort from breast tenderness. Take warm sitz baths to soothe any pain or discomfort caused by hemorrhoids. Use hemorrhoid cream if your health care provider approves. Rest with your legs raised (elevated) if you have leg cramps or low back pain. If you develop varicose veins: Wear support hose as told by your health care provider. Elevate your feet for 15 minutes, 3-4 times a day. Limit salt in your diet. Safety Wear your seat belt at all times when driving or riding in a car. Talk with your  health care provider if someone is verbally or physically abusive to you. Lifestyle Do not use hot tubs, steam rooms, or saunas. Do not douche. Do not use tampons or scented sanitary pads. Avoid cat litter boxes and soil used by cats. These carry germs that can cause birth defects in the baby and possibly loss of the fetus by miscarriage or stillbirth. Do not use herbal remedies, alcohol, illegal drugs, or medicines that are not approved by your health care provider. Chemicals in these products can harm your baby. Do not use any products that contain nicotine or tobacco, such as cigarettes, e-cigarettes, and chewing tobacco. If you need help quitting, ask your health care provider. General instructions During a routine prenatal visit, your health care provider will do a physical exam and other tests. He or she will also discuss your overall health. Keep all follow-up visits. This is important. Ask your health care provider for a  referral to a local prenatal education class. Ask for help if you have counseling or nutritional needs during pregnancy. Your health care provider can offer advice or refer you to specialists for help with various needs. Where to find more information American Pregnancy Association: americanpregnancy.org [employer]: https://www.todd-brady.net/ Office on [employer] Health: MightyRew[employer].nz Contact a health care provider if you have: A headache that does not go away when you take medicine. Vision changes or you see spots in front of your eyes. Mild pelvic cramps, pelvic pressure, or nagging pain in the abdominal area. Persistent nausea, vomiting, or diarrhea. A bad-smelling vaginal discharge or foul-smelling urine. Pain when you urinate. Sudden or extreme swelling of your face, hands, ankles, feet, or legs. A fever. Get help right away if you: Have fluid leaking from your vagina. Have spotting or bleeding from your vagina. Have severe abdominal cramping or pain. Have difficulty breathing. Have chest pain. Have fainting spells. Have not felt your baby move for the time period told by your health care provider. Have new or increased pain, swelling, or redness in an arm or leg. Summary The second trimester of pregnancy is from week 13 through week 27 (months 4 through 6). Do not use herbal remedies, alcohol, illegal drugs, or medicines that are not approved by your health care provider. Chemicals in these products can harm your baby. Exercise only as directed by your health care provider. Most people can continue their usual exercise routine during pregnancy. Keep all follow-up visits. This is important. This information is not intended to replace advice given to you by your health care provider. Make sure you discuss any questions you have with your healthcare provider. Document Revised: 06/06/2019 Document Reviewed:  04/12/2019 Elsevier Patient Education  2022 [employer].  Contraception Choices Contraception, also called birth control, refers to methods or devices thatprevent pregnancy. Hormonal methods  Contraceptive implant A contraceptive implant is a thin, plastic tube that contains a hormone that prevents pregnancy. It is different from an intrauterine device (IUD). It is inserted into the upper part of the arm by a health care provider. Implants canbe effective for up to 3 years. Progestin-only injections Progestin-only injections are injections of progestin, a synthetic form of thehormone progesterone. They are given every 3 months by a health care provider. Birth control pills Birth control pills are pills that contain hormones that prevent pregnancy. They must be taken once a day, preferably at the same time each day. Aprescription is needed to use this method of contraception. Birth control patch The birth control patch contains  hormones that prevent pregnancy. It is placed on the skin and must be changed once a week for three weeks and removed on thefourth week. A prescription is needed to use this method of contraception. Vaginal ring A vaginal ring contains hormones that prevent pregnancy. It is placed in the vagina for three weeks and removed on the fourth week. After that, the process is repeated with a new ring. A prescription is needed to use this method ofcontraception. Emergency contraceptive Emergency contraceptives prevent pregnancy after unprotected sex. They come in pill form and can be taken up to 5 days after sex. They work best the sooner they are taken after having sex. Most emergency contraceptives are available without a prescription. This method should not be used as your only form ofbirth control. Barrier methods  Female condom A female condom is a thin sheath that is worn over the penis during sex. Condoms keep sperm from going inside a woman's body. They can be used with a  sperm-killing substance (spermicide) to increase their effectiveness. They should be thrown away after one use. Female condom A female condom is a soft, loose-fitting sheath that is put into the vagina before sex. The condom keeps sperm from going inside a woman's body. Theyshould be thrown away after one use. Diaphragm A diaphragm is a soft, dome-shaped barrier. It is inserted into the vagina before sex, along with a spermicide. The diaphragm blocks sperm from entering the uterus, and the spermicide kills sperm. A diaphragm should be left in thevagina for 6-8 hours after sex and removed within 24 hours. A diaphragm is prescribed and fitted by a health care provider. A diaphragm should be replaced every 1-2 years, after giving birth, after gaining more than15 lb (6.8 kg), and after pelvic surgery. Cervical cap A cervical cap is a round, soft latex or plastic cup that fits over the cervix. It is inserted into the vagina before sex, along with spermicide. It blocks sperm from entering the uterus. The cap should be left in place for 6-8 hours after sex and removed within 48 hours. A cervical cap must be prescribed andfitted by a health care provider. It should be replaced every 2 years. Sponge A sponge is a soft, circular piece of polyurethane foam with spermicide in it. The sponge helps block sperm from entering the uterus, and the spermicide kills sperm. To use it, you make it wet and then insert it into the vagina. It should be inserted before sex, left in for at least 6 hours after sex, and removed andthrown away within 30 hours. Spermicides Spermicides are chemicals that kill or block sperm from entering the cervix and uterus. They can come as a cream, jelly, suppository, foam, or tablet. A spermicide should be inserted into the vagina with an applicator at least 10-15 minutes before sex to allow time for it to work. The process must be repeatedevery time you have sex. Spermicides do not require a  prescription. Intrauterine contraception Intrauterine device (IUD) An IUD is a T-shaped device that is put in a woman's uterus. There are two types: Hormone IUD.This type contains progestin, a synthetic form of the hormone progesterone. This type can stay in place for 3-5 years. Copper IUD.This type is wrapped in copper wire. It can stay in place for 10 years. Permanent methods of contraception Female tubal ligation In this method, a woman's fallopian tubes are sealed, tied, or blocked duringsurgery to prevent eggs from traveling to the uterus. Hysteroscopic sterilization In this method, a small,  flexible insert is placed into each fallopian tube. The inserts cause scar tissue to form in the fallopian tubes and block them, so sperm cannot reach an egg. The procedure takes about 3 months to be effective.Another form of birth control must be used during those 3 months. Female sterilization This is a procedure to tie off the tubes that carry sperm (vasectomy). After the procedure, the man can still ejaculate fluid (semen). Another form of birth control must be used for 3 months after the procedure. Natural planning methods Natural family planning In this method, a couple does not have sex on days when the woman could become pregnant. Calendar method In this method, the woman keeps track of the length of each menstrual cycle, identifies the days when pregnancy can happen, and does not have sex on those days. Ovulation method In this method, a couple avoids sex during ovulation. Symptothermal method This method involves not having sex during ovulation. The woman typically checks for ovulation bywatching changes in her temperature and in the consistency of cervical mucus. Post-ovulation method In this method, a couple waits to have sex until after ovulation. Where to find more information Centers for Disease Control and Prevention: FootballExhibition.com.br Summary Contraception, also called birth control,  refers to methods or devices that prevent pregnancy. Hormonal methods of contraception include implants, injections, pills, patches, vaginal rings, and emergency contraceptives. Barrier methods of contraception can include female condoms, female condoms, diaphragms, cervical caps, sponges, and spermicides. There are two types of IUDs (intrauterine devices). An IUD can be put in a woman's uterus to prevent pregnancy for 3-5 years. Permanent sterilization can be done through a procedure for males and females. Natural family planning methods involve nothaving sex on days when the woman could become pregnant. This information is not intended to replace advice given to you by your health care provider. Make sure you discuss any questions you have with your healthcare provider. Document Revised: 06/04/2019 Document Reviewed: 06/04/2019 Elsevier Patient Education  2022 [employer]  Postpartum Baby Blues The postpartum period begins right after the birth of a baby. During this time, there is often joy and excitement. It is also a time of many changes in the life of the parents. A mother may feel happy one minute and sad or stressed the next. These feelings of sadness, called the baby blues, usually happen in theperiod right after the baby is born and go away within a week or two. What are the causes? The exact cause of this condition is not known. Changes in hormone levels afterchildbirth are believed to trigger some of the symptoms. Other factors that can play a role in these mood changes include: Lack of sleep. Stressful life events, such as financial problems, caring for a loved one, or death of a loved one. Genetics. What are the signs or symptoms? Symptoms of this condition include: Changes in mood, such as going from extreme happiness to sadness. A decrease in concentration. Difficulty sleeping. Crying spells and tearfulness. Loss of appetite. Irritability. Anxiety. If these symptoms last  for more than 2 weeks or become more severe, you mayhave postpartum depression. How is this diagnosed? This condition is diagnosed based on an evaluation of your symptoms. Your health care provider may use a screening tool that includes a list of questionsto help identify a person with the baby blues or postpartum depression. How is this treated? The baby blues usually go away on their own in 1-2 weeks. Social support isoften what is needed. You will be  encouraged to get adequate sleep and rest. Follow these instructions at home: Lifestyle     Get as much rest as you can. Take a nap when the baby sleeps. Exercise regularly as told by your health care provider. Some women find yoga and walking to be helpful. Eat a balanced and nourishing diet. This includes plenty of fruits and vegetables, whole grains, and lean proteins. Do little things that you enjoy. Take a bubble bath, read your favorite magazine, or listen to your favorite music. Avoid alcohol. Ask for help with household chores, cooking, grocery shopping, or running errands. Do not try to do everything yourself. Consider hiring a postpartum doula to help. This is a professional who specializes in providing support to new mothers. Try not to make any major life changes during pregnancy or right after giving birth. This can add stress. General instructions Talk to people close to you about how you are feeling. Get support from your partner, family members, friends, or other new moms. You may want to join a support group. Find ways to manage stress. This may include: Writing your thoughts and feelings in a journal. Spending time outside. Spending time with people who make you laugh. Try to stay positive in how you think. Think about the things you are grateful for. Take over-the-counter and prescription medicines only as told by your health care provider. Let your health care provider know if you have any concerns. Keep all postpartum  visits. This is important. Contact a health care provider if: Your baby blues do not go away after 2 weeks. Get help right away if: You have thoughts of taking your own life (suicidal thoughts), or of harming your baby or someone else. You see or hear things that are not there (hallucinations). If you ever feel like you may hurt yourself or others, or have thoughts about taking your own life, get help right away. Go to your nearest emergency department or: Call your local emergency services (911 in the [place] Call a suicide crisis helpline, such as the National Suicide Prevention Lifeline, at 959 613 0210. This is open 24 hours a day in the [place] Text the Crisis Text Line at 570-119-2477 (in the [place] Summary After giving birth, you may feel happy one minute and sad or stressed the next. Feelings of sadness that happen right after the baby is born and go away after a week or two are called the baby blues. You can manage the baby blues by getting enough rest, eating a healthy diet, exercising, spending time with supportive people, and finding ways to manage stress. If feelings of sadness and stress last longer than 2 weeks or get in the way of caring for your baby, talk with your health care provider. This may mean you have postpartum depression. This information is not intended to replace advice given to you by your health care provider. Make sure you discuss any questions you have with your healthcare provider. Document Revised: 06/22/2019 Document Reviewed: 06/22/2019 Elsevier Patient Education  2022 [employer].

## 2020-07-22 LAB — OBSTETRIC PANEL, INCLUDING HIV
Antibody Screen: NEGATIVE
Basophils Absolute: 0 10*3/uL (ref 0.0–0.2)
Basos: 0 %
EOS (ABSOLUTE): 0.2 10*3/uL (ref 0.0–0.4)
Eos: 3 %
HIV Screen 4th Generation wRfx: NONREACTIVE
Hematocrit: 37.3 % (ref 34.0–46.6)
Hemoglobin: 12.6 g/dL (ref 11.1–15.9)
Hepatitis B Surface Ag: NEGATIVE
Immature Grans (Abs): 0 10*3/uL (ref 0.0–0.1)
Immature Granulocytes: 0 %
Lymphocytes Absolute: 1.2 10*3/uL (ref 0.7–3.1)
Lymphs: 17 %
MCH: 27.2 pg (ref 26.6–33.0)
MCHC: 33.8 g/dL (ref 31.5–35.7)
MCV: 81 fL (ref 79–97)
Monocytes Absolute: 0.5 10*3/uL (ref 0.1–0.9)
Monocytes: 7 %
Neutrophils Absolute: 5.1 10*3/uL (ref 1.4–7.0)
Neutrophils: 73 %
Platelets: 330 10*3/uL (ref 150–450)
RBC: 4.63 x10E6/uL (ref 3.77–5.28)
RDW: 13.1 % (ref 11.7–15.4)
RPR Ser Ql: NONREACTIVE
Rh Factor: POSITIVE
Rubella Antibodies, IGG: 3.01 index (ref >0.99)
WBC: 7.1 10*3/uL (ref 3.4–10.8)

## 2020-07-22 LAB — CERVICOVAGINAL ANCILLARY ONLY
Chlamydia: NEGATIVE
Comment: NEGATIVE
Comment: NORMAL
Neisseria Gonorrhea: NEGATIVE

## 2020-07-22 LAB — HEMOGLOBIN A1C
Est. average glucose Bld gHb Est-mCnc: 111 mg/dL
Hgb A1c MFr Bld: 5.5 % (ref 4.8–5.6)

## 2020-07-22 LAB — HEPATITIS C ANTIBODY: Hep C Virus Ab: 0.1 s/co ratio (ref 0.0–0.9)

## 2020-07-23 LAB — CULTURE, OB URINE

## 2020-07-23 LAB — URINE CULTURE, OB REFLEX

## 2020-07-28 ENCOUNTER — Encounter: Payer: Self-pay | Admitting: Obstetrics and Gynecology

## 2020-07-28 ENCOUNTER — Encounter: Payer: Medicaid Other | Admitting: Licensed Clinical Social Worker

## 2020-07-29 ENCOUNTER — Encounter: Payer: Self-pay | Admitting: Obstetrics and Gynecology

## 2020-07-29 DIAGNOSIS — R87610 Atypical squamous cells of undetermined significance on cytologic smear of cervix (ASC-US): Secondary | ICD-10-CM | POA: Insufficient documentation

## 2020-07-29 DIAGNOSIS — R87810 Cervical high risk human papillomavirus (HPV) DNA test positive: Secondary | ICD-10-CM | POA: Insufficient documentation

## 2020-07-29 LAB — CYTOLOGY - PAP
Comment: NEGATIVE
Diagnosis: UNDETERMINED — AB
High risk HPV: POSITIVE — AB

## 2020-08-01 ENCOUNTER — Encounter: Payer: Self-pay | Admitting: Obstetrics and Gynecology

## 2020-08-04 ENCOUNTER — Encounter: Payer: Medicaid Other | Admitting: Licensed Clinical Social Worker

## 2020-08-05 ENCOUNTER — Ambulatory Visit (INDEPENDENT_AMBULATORY_CARE_PROVIDER_SITE_OTHER): Payer: Medicaid Other | Admitting: Licensed Clinical Social Worker

## 2020-08-05 DIAGNOSIS — O99340 Other mental disorders complicating pregnancy, unspecified trimester: Secondary | ICD-10-CM | POA: Diagnosis not present

## 2020-08-05 DIAGNOSIS — Z3A00 Weeks of gestation of pregnancy not specified: Secondary | ICD-10-CM

## 2020-08-05 DIAGNOSIS — F32A Depression, unspecified: Secondary | ICD-10-CM

## 2020-08-05 NOTE — BH Specialist Note (Addendum)
Integrated Behavioral Health via Telemedicine Visit  08/05/2020 Jean Solis [id number]  Number of Integrated Behavioral Health visits: 1 Session Start time: 2:47pm  Session End time: 3:10pm Total time: 23 mins via mychart video   Referring Provider: Chana Bode MD Patient/Family location: Home  [facility] Provider location: [facility]    All persons participating in visit: Jean Solis and LCSWA A. Felton Clinton  Types of Service: General Behavioral Integrated Care (BHI)  I connected with Jean Solis and/or Jean Solis's n/a via  Telephone or Video Enabled Telemedicine Application  (Video is Caregility application) and verified that I am speaking with the correct person using two identifiers. Discussed confidentiality: Yes   I discussed the limitations of telemedicine and the availability of in person appointments.  Discussed there is a possibility of technology failure and discussed alternative modes of communication if that failure occurs.  I discussed that engaging in this telemedicine visit, they consent to the provision of behavioral healthcare and the services will be billed under their insurance.  Patient and/or legal guardian expressed understanding and consented to Telemedicine visit: Yes   Presenting Concerns: Patient and/or family reports the following symptoms/concerns:  depressed mood, stress and loss of interest  Duration of problem: approx 5 months ; Severity of problem: mild  Patient and/or Family's Strengths/Protective Factors: Concrete supports in place (healthy food, safe environments, etc.)  Goals Addressed: Patient will:  Reduce symptoms of: depression and stress   Increase knowledge and/or ability of: coping skills and stress reduction   Demonstrate ability to: Increase healthy adjustment to current life circumstances and Increase adequate support systems for patient/family  Progress towards Goals: Ongoing  Interventions: Interventions utilized:   Solution-Focused Strategies and Supportive Counseling Standardized Assessments completed: PHQ 9  Patient and/or Family Response: Jean Solis reports depressed mood and fleeting thoughts not wanting to continue to with life. Jean Solis reports feeling overwhelmed with responsibilities such as finances and parenting. Jean Solis reports father of baby is supportive. Jean Solis reports no change in mood with taking Buspar and inquired about an alternative medication. LCSWA A. Felton Clinton advised Jean Solis information will be provided to prescribing provider and if changes are granted a staff member will contact Jean Solis. LCSWA A. Felton Clinton discussed several coping skills to boost mood and alleviate depressive symptoms.   Assessment: Patient currently experiencing: depression affecting pregnancy.   Patient may benefit from integrated behavioral health.  Plan: Follow up with behavioral health clinician on : 3 weeks via mychart or in person  Behavioral recommendations: Communicate needs to added support, prioritize and focus on high priority task to prevent burnout, discuss and revise parenting duties as needed, schedule time for self care and prioritize rest  Referral(s): Integrated [employer] (In Clinic)  I discussed the assessment and treatment plan with the patient and/or parent/guardian. They were provided an opportunity to ask questions and all were answered. They agreed with the plan and demonstrated an understanding of the instructions.   They were advised to call back or seek an in-person evaluation if the symptoms worsen or if the condition fails to improve as anticipated.  Gwyndolyn Saxon, LCSW

## 2020-08-19 ENCOUNTER — Encounter: Payer: Medicaid Other | Admitting: Obstetrics and Gynecology

## 2020-08-22 ENCOUNTER — Ambulatory Visit: Payer: Medicaid Other | Admitting: Obstetrics and Gynecology

## 2020-08-22 ENCOUNTER — Other Ambulatory Visit: Payer: Self-pay

## 2020-08-22 VITALS — BP 118/68 | HR 64 | Wt 206.6 lb

## 2020-08-22 DIAGNOSIS — F419 Anxiety disorder, unspecified: Secondary | ICD-10-CM

## 2020-08-22 DIAGNOSIS — R87610 Atypical squamous cells of undetermined significance on cytologic smear of cervix (ASC-US): Secondary | ICD-10-CM

## 2020-08-22 DIAGNOSIS — Z3480 Encounter for supervision of other normal pregnancy, unspecified trimester: Secondary | ICD-10-CM | POA: Diagnosis not present

## 2020-08-22 DIAGNOSIS — R87810 Cervical high risk human papillomavirus (HPV) DNA test positive: Secondary | ICD-10-CM

## 2020-08-22 MED ORDER — HYDROXYZINE PAMOATE 25 MG PO CAPS: 25.0000 mg | ORAL_CAPSULE | Freq: Three times a day (TID) | ORAL | 0 refills | Status: DC | PRN

## 2020-08-22 MED ORDER — VITAFOL GUMMIES 3.33-0.333-34.8 MG PO CHEW: 1.0000 | CHEWABLE_TABLET | Freq: Every day | ORAL | 5 refills | Status: DC

## 2020-08-22 NOTE — Progress Notes (Signed)
   PRENATAL VISIT NOTE  Subjective:  Jean Solis is a 25 y.o. G3P2002 at [redacted]w[redacted]d being seen today for ongoing prenatal care.  She is currently monitored for the following issues for this low-risk pregnancy and has Eczema; Pre-diabetes; Genital herpes; Supervision of other normal pregnancy, antepartum; Alpha thalassemia silent carrier; Anxiety; Short interval between pregnancies affecting pregnancy, antepartum; and ASCUS with positive high risk HPV cervical on their problem list.  Needs colpo   Patient reports no complaints.   . Vag. Bleeding: None.   . Denies leaking of fluid.   The following portions of the patient's history were reviewed and updated as appropriate: allergies, current medications, past family history, past medical history, past social history, past surgical history and problem list.   Objective:   Vitals:   08/22/20 0848  BP: 118/68  Pulse: 64  Weight: 206 lb 9.6 oz (93.7 kg)    Fetal Status: Fetal Heart Rate (bpm): 154         General:  Alert, oriented and cooperative. Patient is in no acute distress.  Skin: Skin is warm and dry. No rash noted.   Cardiovascular: Normal heart rate noted  Respiratory: Normal respiratory effort, no problems with respiration noted  Abdomen: Soft, gravid, appropriate for gestational age.  Pain/Pressure: Absent     Pelvic: Cervical exam deferred        Extremities: Normal range of motion.  Edema: None  Mental Status: Normal mood and affect. Normal behavior. Normal judgment and thought content.   Assessment and Plan:  Pregnancy: G3P2002 at [redacted]w[redacted]d  1. Supervision of other normal pregnancy, antepartum  - AFP, Serum, Open Spina Bifida - Prenatal Vit-Fe Phos-FA-Omega (VITAFOL GUMMIES) 3.33-0.333-34.8 MG CHEW; Chew 1 tablet by mouth daily.  Dispense: 90 tablet; Refill: 5  2. ASCUS with positive high risk HPV cervical  - Pap results to clinical pool last month. Patient was supposed to have colpo today however it was not schedule. Colpo  should be scheduled with next visit   3. Anxiety  Was taking buspar, however stopped after 3 weeks because she did not notice a difference.  She reports having insomnia and would like something to help her sleep, which she feels will improve anxiety. RX: vistaril. Avoid cell phone use prior to bed. Warm baths are ok. Meditate and use magnesium.   Preterm labor symptoms and general obstetric precautions including but not limited to vaginal bleeding, contractions, leaking of fluid and fetal movement were reviewed in detail with the patient. Please refer to After Visit Summary for other counseling recommendations.   Return with MD for colpo.  Future Appointments  Date Time Provider Department Center  09/02/2020  8:15 AM [facility] US2 [facility]  09/02/2020  2:50 PM Constant, Gigi Gin, MD [facility] None    Venia Carbon, NP

## 2020-08-22 NOTE — Progress Notes (Signed)
ROB 17.4wks Concerned about occasional pain in upper abdomen where muscles separated with last pregnancy.  Reports recommendation from counselor for a different anxiety medication. No longer taking Buspar.

## 2020-08-24 LAB — AFP, SERUM, OPEN SPINA BIFIDA
AFP MoM: 1.77
AFP Value: 67.4 ng/mL
Gest. Age on Collection Date: 17.4 weeks
Maternal Age At EDD: 27.1 yr
OSBR Risk 1 IN: 2747
Test Results:: NEGATIVE
Weight: 206 [lb_av]

## 2020-09-01 ENCOUNTER — Ambulatory Visit: Payer: Medicaid Other

## 2020-09-02 ENCOUNTER — Other Ambulatory Visit: Payer: Self-pay | Admitting: Obstetrics and Gynecology

## 2020-09-02 ENCOUNTER — Ambulatory Visit (INDEPENDENT_AMBULATORY_CARE_PROVIDER_SITE_OTHER): Payer: 59 | Admitting: Obstetrics and Gynecology

## 2020-09-02 ENCOUNTER — Ambulatory Visit: Payer: 59 | Attending: Obstetrics and Gynecology

## 2020-09-02 ENCOUNTER — Encounter: Payer: Self-pay | Admitting: Obstetrics and Gynecology

## 2020-09-02 ENCOUNTER — Other Ambulatory Visit: Payer: Self-pay

## 2020-09-02 VITALS — BP 102/68 | HR 81 | Wt 210.0 lb

## 2020-09-02 DIAGNOSIS — O99342 Other mental disorders complicating pregnancy, second trimester: Secondary | ICD-10-CM

## 2020-09-02 DIAGNOSIS — Z148 Genetic carrier of other disease: Secondary | ICD-10-CM

## 2020-09-02 DIAGNOSIS — Z363 Encounter for antenatal screening for malformations: Secondary | ICD-10-CM | POA: Insufficient documentation

## 2020-09-02 DIAGNOSIS — Z3A19 19 weeks gestation of pregnancy: Secondary | ICD-10-CM

## 2020-09-02 DIAGNOSIS — Z362 Encounter for other antenatal screening follow-up: Secondary | ICD-10-CM

## 2020-09-02 DIAGNOSIS — Z3480 Encounter for supervision of other normal pregnancy, unspecified trimester: Secondary | ICD-10-CM

## 2020-09-02 DIAGNOSIS — F419 Anxiety disorder, unspecified: Secondary | ICD-10-CM

## 2020-09-02 DIAGNOSIS — O99212 Obesity complicating pregnancy, second trimester: Secondary | ICD-10-CM | POA: Insufficient documentation

## 2020-09-02 DIAGNOSIS — R69 Illness, unspecified: Secondary | ICD-10-CM | POA: Diagnosis not present

## 2020-09-02 DIAGNOSIS — R87810 Cervical high risk human papillomavirus (HPV) DNA test positive: Secondary | ICD-10-CM | POA: Diagnosis not present

## 2020-09-02 DIAGNOSIS — R87610 Atypical squamous cells of undetermined significance on cytologic smear of cervix (ASC-US): Secondary | ICD-10-CM

## 2020-09-02 NOTE — Progress Notes (Signed)
Patient given informed consent, signed copy in the chart, time out was performed.  Placed in lithotomy position. Cervix viewed with speculum and colposcope after application of acetic acid.   Colposcopy adequate?  yes Acetowhite lesions?no Punctation?no Mosaicism?  no Abnormal vasculature?  no Biopsies?no ECC?no  COMMENTS: Patient was given post procedure instructions.  She will return in 1 year for repeat pap smear  Catalina Antigua, MD

## 2020-09-02 NOTE — Progress Notes (Signed)
Increased fetal movement. Pt is here for OB colpo today.

## 2020-09-02 NOTE — Progress Notes (Signed)
   PRENATAL VISIT NOTE  Subjective:  Jean Solis is a 25 y.o. G3P2002 at [redacted]w[redacted]d being seen today for ongoing prenatal care.  She is currently monitored for the following issues for this low-risk pregnancy and has Eczema; Pre-diabetes; Genital herpes; Supervision of other normal pregnancy, antepartum; Alpha thalassemia silent carrier; Anxiety; Short interval between pregnancies affecting pregnancy, antepartum; and ASCUS with positive high risk HPV cervical on their problem list.  Patient reports no complaints.  Contractions: Not present. Vag. Bleeding: None.  Movement: Present. Denies leaking of fluid.   The following portions of the patient's history were reviewed and updated as appropriate: allergies, current medications, past family history, past medical history, past social history, past surgical history and problem list.   Objective:   Vitals:   09/02/20 1502  BP: 102/68  Pulse: 81  Weight: 210 lb (95.3 kg)    Fetal Status: Fetal Heart Rate (bpm): 159   Movement: Present     General:  Alert, oriented and cooperative. Patient is in no acute distress.  Skin: Skin is warm and dry. No rash noted.   Cardiovascular: Normal heart rate noted  Respiratory: Normal respiratory effort, no problems with respiration noted  Abdomen: Soft, gravid, appropriate for gestational age.  Pain/Pressure: Present     Pelvic: Cervical exam performed in the presence of a chaperone        Extremities: Normal range of motion.  Edema: None  Mental Status: Normal mood and affect. Normal behavior. Normal judgment and thought content.   Assessment and Plan:  Pregnancy: G3P2002 at [redacted]w[redacted]d 1. Supervision of other normal pregnancy, antepartum Patient is doing well without complaints Follow up anatomy ultrasound scheduled  2. ASCUS with positive high risk HPV cervical colpo today- see separate note  3. Anxiety Stable with Vistaril  Preterm labor symptoms and general obstetric precautions including but not  limited to vaginal bleeding, contractions, leaking of fluid and fetal movement were reviewed in detail with the patient. Please refer to After Visit Summary for other counseling recommendations.   Return in about 4 weeks (around 09/30/2020) for in person, ROB, Low risk.  Future Appointments  Date Time Provider Department Center  09/30/2020  3:00 PM [facility] NURSE [facility]  09/30/2020  3:15 PM [facility]2 [facility]    Catalina Antigua, MD

## 2020-09-30 ENCOUNTER — Ambulatory Visit: Payer: 59 | Admitting: *Deleted

## 2020-09-30 ENCOUNTER — Ambulatory Visit: Payer: 59 | Attending: Obstetrics and Gynecology

## 2020-09-30 ENCOUNTER — Other Ambulatory Visit: Payer: Self-pay

## 2020-09-30 ENCOUNTER — Ambulatory Visit (INDEPENDENT_AMBULATORY_CARE_PROVIDER_SITE_OTHER): Payer: PRIVATE HEALTH INSURANCE | Admitting: Advanced Practice Midwife

## 2020-09-30 ENCOUNTER — Encounter: Payer: Self-pay | Admitting: *Deleted

## 2020-09-30 VITALS — BP 114/74 | HR 102 | Wt 220.0 lb

## 2020-09-30 VITALS — BP 123/65 | HR 80

## 2020-09-30 DIAGNOSIS — O09899 Supervision of other high risk pregnancies, unspecified trimester: Secondary | ICD-10-CM

## 2020-09-30 DIAGNOSIS — R87610 Atypical squamous cells of undetermined significance on cytologic smear of cervix (ASC-US): Secondary | ICD-10-CM

## 2020-09-30 DIAGNOSIS — Z3480 Encounter for supervision of other normal pregnancy, unspecified trimester: Secondary | ICD-10-CM

## 2020-09-30 DIAGNOSIS — Z362 Encounter for other antenatal screening follow-up: Secondary | ICD-10-CM | POA: Insufficient documentation

## 2020-09-30 DIAGNOSIS — O99340 Other mental disorders complicating pregnancy, unspecified trimester: Secondary | ICD-10-CM | POA: Insufficient documentation

## 2020-09-30 DIAGNOSIS — Z3A23 23 weeks gestation of pregnancy: Secondary | ICD-10-CM

## 2020-09-30 DIAGNOSIS — E669 Obesity, unspecified: Secondary | ICD-10-CM | POA: Diagnosis not present

## 2020-09-30 DIAGNOSIS — F32A Depression, unspecified: Secondary | ICD-10-CM

## 2020-09-30 DIAGNOSIS — K429 Umbilical hernia without obstruction or gangrene: Secondary | ICD-10-CM

## 2020-09-30 DIAGNOSIS — R87810 Cervical high risk human papillomavirus (HPV) DNA test positive: Secondary | ICD-10-CM

## 2020-09-30 DIAGNOSIS — T2122XA Burn of second degree of abdominal wall, initial encounter: Secondary | ICD-10-CM

## 2020-09-30 DIAGNOSIS — F419 Anxiety disorder, unspecified: Secondary | ICD-10-CM

## 2020-09-30 DIAGNOSIS — O99212 Obesity complicating pregnancy, second trimester: Secondary | ICD-10-CM

## 2020-09-30 MED ORDER — SERTRALINE HCL 50 MG PO TABS: 50.0000 mg | ORAL_TABLET | Freq: Every day | ORAL | 5 refills | Status: DC

## 2020-09-30 MED ORDER — SERTRALINE HCL 25 MG PO TABS: 25.0000 mg | ORAL_TABLET | Freq: Every day | ORAL | 0 refills | Status: DC
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
## 2020-10-28 ENCOUNTER — Other Ambulatory Visit: Payer: Self-pay

## 2020-10-28 ENCOUNTER — Other Ambulatory Visit: Payer: Medicaid Other

## 2020-10-28 ENCOUNTER — Ambulatory Visit (INDEPENDENT_AMBULATORY_CARE_PROVIDER_SITE_OTHER): Payer: Medicaid Other | Admitting: Advanced Practice Midwife

## 2020-10-28 ENCOUNTER — Other Ambulatory Visit (HOSPITAL_COMMUNITY)
Admission: RE | Admit: 2020-10-28 | Discharge: 2020-10-28 | Disposition: A | Discharge status: Home / Self Care | Payer: 59 | Source: Ambulatory Visit | Attending: Advanced Practice Midwife | Admitting: Advanced Practice Midwife

## 2020-10-28 VITALS — BP 109/73 | HR 83 | Wt 225.0 lb

## 2020-10-28 DIAGNOSIS — O99340 Other mental disorders complicating pregnancy, unspecified trimester: Secondary | ICD-10-CM

## 2020-10-28 DIAGNOSIS — Z3A27 27 weeks gestation of pregnancy: Secondary | ICD-10-CM | POA: Insufficient documentation

## 2020-10-28 DIAGNOSIS — N898 Other specified noninflammatory disorders of vagina: Secondary | ICD-10-CM

## 2020-10-28 DIAGNOSIS — F32A Depression, unspecified: Secondary | ICD-10-CM

## 2020-10-28 DIAGNOSIS — Z3482 Encounter for supervision of other normal pregnancy, second trimester: Secondary | ICD-10-CM | POA: Diagnosis not present

## 2020-10-28 DIAGNOSIS — O99342 Other mental disorders complicating pregnancy, second trimester: Secondary | ICD-10-CM | POA: Insufficient documentation

## 2020-10-28 DIAGNOSIS — O26899 Other specified pregnancy related conditions, unspecified trimester: Secondary | ICD-10-CM

## 2020-10-28 DIAGNOSIS — Z23 Encounter for immunization: Secondary | ICD-10-CM

## 2020-10-28 DIAGNOSIS — Z3480 Encounter for supervision of other normal pregnancy, unspecified trimester: Secondary | ICD-10-CM

## 2020-10-28 LAB — OB RESULTS CONSOLE GC/CHLAMYDIA: Gonorrhea: NEGATIVE

## 2020-10-28 MED ORDER — SERTRALINE HCL 50 MG PO TABS: 50.0000 mg | ORAL_TABLET | Freq: Every day | ORAL | 5 refills | Status: DC

## 2020-10-28 NOTE — Progress Notes (Signed)
+   fetal movement. Pt c/o possible vaginal infection. Will have patient obtain self swab. Pt request refills of anxiety and depression meds be sent to [facility].

## 2020-10-28 NOTE — Progress Notes (Signed)
   PRENATAL VISIT NOTE  Subjective:  Jean Solis is a 25 y.o. G3P2002 at [redacted]w[redacted]d being seen today for ongoing prenatal care.  She is currently monitored for the following issues for this low-risk pregnancy and has Eczema; Pre-diabetes; Genital herpes; Supervision of other normal pregnancy, antepartum; Alpha thalassemia silent carrier; Anxiety; Short interval between pregnancies affecting pregnancy, antepartum; ASCUS with positive high risk HPV cervical; and Depression affecting pregnancy on their problem list.  Patient reports vaginal irritation.  Contractions: Not present. Vag. Bleeding: None.  Movement: Present. Denies leaking of fluid.   The following portions of the patient's history were reviewed and updated as appropriate: allergies, current medications, past family history, past medical history, past social history, past surgical history and problem list.   Objective:   Vitals:   10/28/20 0857  BP: 109/73  Pulse: 83  Weight: 225 lb (102.1 kg)    Fetal Status:     Movement: Present     General:  Alert, oriented and cooperative. Patient is in no acute distress.  Skin: Skin is warm and dry. No rash noted.   Cardiovascular: Normal heart rate noted  Respiratory: Normal respiratory effort, no problems with respiration noted  Abdomen: Soft, gravid, appropriate for gestational age.  Pain/Pressure: Absent     Pelvic: Cervical exam deferred        Extremities: Normal range of motion.  Edema: Trace  Mental Status: Normal mood and affect. Normal behavior. Normal judgment and thought content.   Assessment and Plan:  Pregnancy: G3P2002 at [redacted]w[redacted]d 1. Supervision of other normal pregnancy, antepartum --Anticipatory guidance about next visits/weeks of pregnancy given. --Next visit in 2 weeks  - Glucose Tolerance, 2 Hours w/1 Hour - CBC - RPR - HIV Antibody (routine testing w rflx) - Tdap vaccine greater than or equal to 7yo IM  2. Depression affecting pregnancy --Refill Zoloft today,  doing well  3. [redacted] weeks gestation of pregnancy   4. Vaginal discharge during pregnancy, antepartum --Self swab today - Cervicovaginal ancillary only( [facility])   Preterm labor symptoms and general obstetric precautions including but not limited to vaginal bleeding, contractions, leaking of fluid and fetal movement were reviewed in detail with the patient. Please refer to After Visit Summary for other counseling recommendations.   Return in about 2 weeks (around 11/11/2020).  No future appointments.   Sharen Counter, CNM

## 2020-10-29 LAB — CERVICOVAGINAL ANCILLARY ONLY
Bacterial Vaginitis (gardnerella): NEGATIVE
Candida Glabrata: NEGATIVE
Candida Vaginitis: NEGATIVE
Chlamydia: NEGATIVE
Comment: NEGATIVE
Comment: NEGATIVE
Comment: NEGATIVE
Comment: NEGATIVE
Comment: NEGATIVE
Comment: NORMAL
Neisseria Gonorrhea: NEGATIVE
Trichomonas: NEGATIVE

## 2020-10-29 LAB — HIV ANTIBODY (ROUTINE TESTING W REFLEX): HIV Screen 4th Generation wRfx: NONREACTIVE

## 2020-10-29 LAB — CBC
Hematocrit: 36.4 % (ref 34.0–46.6)
Hemoglobin: 12.2 g/dL (ref 11.1–15.9)
MCH: 27.4 pg (ref 26.6–33.0)
MCHC: 33.5 g/dL (ref 31.5–35.7)
MCV: 82 fL (ref 79–97)
Platelets: 232 10*3/uL (ref 150–450)
RBC: 4.45 x10E6/uL (ref 3.77–5.28)
RDW: 14.2 % (ref 11.7–15.4)
WBC: 8.1 10*3/uL (ref 3.4–10.8)

## 2020-10-29 LAB — GLUCOSE TOLERANCE, 2 HOURS W/ 1HR
Glucose, 1 hour: 80 mg/dL (ref 70–179)
Glucose, 2 hour: 103 mg/dL (ref 70–152)
Glucose, Fasting: 80 mg/dL (ref 70–91)

## 2020-10-29 LAB — RPR: RPR Ser Ql: NONREACTIVE

## 2020-11-11 ENCOUNTER — Encounter: Payer: Self-pay | Admitting: Obstetrics

## 2020-11-11 ENCOUNTER — Ambulatory Visit (INDEPENDENT_AMBULATORY_CARE_PROVIDER_SITE_OTHER): Payer: Medicaid Other | Admitting: Obstetrics

## 2020-11-11 ENCOUNTER — Other Ambulatory Visit: Payer: Self-pay

## 2020-11-11 ENCOUNTER — Ambulatory Visit: Payer: Medicaid Other | Admitting: Licensed Clinical Social Worker

## 2020-11-11 VITALS — BP 124/76 | HR 110 | Wt 221.0 lb

## 2020-11-11 DIAGNOSIS — Z3480 Encounter for supervision of other normal pregnancy, unspecified trimester: Secondary | ICD-10-CM

## 2020-11-11 NOTE — Progress Notes (Addendum)
Subjective:  Jean Solis is a 25 y.o. G3P2002 at [redacted]w[redacted]d being seen today for ongoing prenatal care.  She is currently monitored for the following issues for this low-risk pregnancy and has Eczema; Pre-diabetes; Genital herpes; Supervision of other normal pregnancy, antepartum; Alpha thalassemia silent carrier; Anxiety; Short interval between pregnancies affecting pregnancy, antepartum; ASCUS with positive high risk HPV cervical; and Depression affecting pregnancy on their problem list.  Patient reports headache.  Contractions: Not present. Vag. Bleeding: None.  Movement: Present. Denies leaking of fluid.   The following portions of the patient's history were reviewed and updated as appropriate: allergies, current medications, past family history, past medical history, past social history, past surgical history and problem list. Problem list updated.  Objective:   Vitals:   11/11/20 1025  BP: 124/76  Pulse: (!) 110  Weight: 221 lb (100.2 kg)    Fetal Status:     Movement: Present     General:  Alert, oriented and cooperative. Patient is in no acute distress.  Skin: Skin is warm and dry. No rash noted.   Cardiovascular: Normal heart rate noted  Respiratory: Normal respiratory effort, no problems with respiration noted  Abdomen: Soft, gravid, appropriate for gestational age. Pain/Pressure: Absent     Pelvic:  Cervical exam deferred        Extremities: Normal range of motion.  Edema: None  Mental Status: Normal mood and affect. Normal behavior. Normal judgment and thought content.   Urinalysis:      Assessment and Plan:  Pregnancy: G3P2002 at [redacted]w[redacted]d  Preterm labor symptoms and general obstetric precautions including but not limited to vaginal bleeding, contractions, leaking of fluid and fetal movement were reviewed in detail with the patient. Please refer to After Visit Summary for other counseling recommendations.   Return in about 2 weeks (around 11/25/2020) for ROB.   Brock Bad, MD  11/11/20

## 2020-11-13 DIAGNOSIS — Z3483 Encounter for supervision of other normal pregnancy, third trimester: Secondary | ICD-10-CM | POA: Diagnosis not present

## 2020-11-13 DIAGNOSIS — Z3482 Encounter for supervision of other normal pregnancy, second trimester: Secondary | ICD-10-CM | POA: Diagnosis not present

## 2020-11-25 ENCOUNTER — Other Ambulatory Visit: Payer: Self-pay

## 2020-11-25 ENCOUNTER — Ambulatory Visit (INDEPENDENT_AMBULATORY_CARE_PROVIDER_SITE_OTHER): Payer: Medicaid Other | Admitting: Obstetrics and Gynecology

## 2020-11-25 ENCOUNTER — Ambulatory Visit (INDEPENDENT_AMBULATORY_CARE_PROVIDER_SITE_OTHER): Payer: 59 | Admitting: Licensed Clinical Social Worker

## 2020-11-25 VITALS — BP 131/74 | HR 109 | Wt 224.0 lb

## 2020-11-25 DIAGNOSIS — R69 Illness, unspecified: Secondary | ICD-10-CM | POA: Diagnosis not present

## 2020-11-25 DIAGNOSIS — F32A Depression, unspecified: Secondary | ICD-10-CM

## 2020-11-25 DIAGNOSIS — O99340 Other mental disorders complicating pregnancy, unspecified trimester: Secondary | ICD-10-CM

## 2020-11-25 DIAGNOSIS — Z3480 Encounter for supervision of other normal pregnancy, unspecified trimester: Secondary | ICD-10-CM

## 2020-11-25 DIAGNOSIS — O09899 Supervision of other high risk pregnancies, unspecified trimester: Secondary | ICD-10-CM

## 2020-11-25 DIAGNOSIS — A6004 Herpesviral vulvovaginitis: Secondary | ICD-10-CM

## 2020-11-25 DIAGNOSIS — F419 Anxiety disorder, unspecified: Secondary | ICD-10-CM

## 2020-11-25 MED ORDER — CYCLOBENZAPRINE HCL 10 MG PO TABS: 10.0000 mg | ORAL_TABLET | Freq: Three times a day (TID) | ORAL | 2 refills | Status: DC | PRN

## 2020-11-25 NOTE — Progress Notes (Signed)
   PRENATAL VISIT NOTE  Subjective:  Jean Solis is a 25 y.o. G3P2002 at [redacted]w[redacted]d being seen today for ongoing prenatal care.  She is currently monitored for the following issues for this low-risk pregnancy and has Eczema; Pre-diabetes; Genital herpes; Supervision of other normal pregnancy, antepartum; Alpha thalassemia silent carrier; Anxiety; Short interval between pregnancies affecting pregnancy, antepartum; ASCUS with positive high risk HPV cervical; and Depression affecting pregnancy on their problem list.  Patient reports no complaints.  Contractions: Not present. Vag. Bleeding: None, Other.  Movement: Present. Denies leaking of fluid.   The following portions of the patient's history were reviewed and updated as appropriate: allergies, current medications, past family history, past medical history, past social history, past surgical history and problem list.   Objective:   Vitals:   11/25/20 1339 11/25/20 1342  BP: (!) 143/85 131/74  Pulse: (!) 107 (!) 109  Weight: 224 lb (101.6 kg)     Fetal Status: Fetal Heart Rate (bpm): 146 Fundal Height: 32 cm Movement: Present     General:  Alert, oriented and cooperative. Patient is in no acute distress.  Skin: Skin is warm and dry. No rash noted.   Cardiovascular: Normal heart rate noted  Respiratory: Normal respiratory effort, no problems with respiration noted  Abdomen: Soft, gravid, appropriate for gestational age.  Pain/Pressure: Absent     Pelvic: Cervical exam deferred        Extremities: Normal range of motion.  Edema: None  Mental Status: Normal mood and affect. Normal behavior. Normal judgment and thought content.   Assessment and Plan:  Pregnancy: G3P2002 at [redacted]w[redacted]d 1. Supervision of other normal pregnancy, antepartum Patient is doing well without complaints Reports occasional tension headaches- rx flexeril provided Plans postpartum IUD  2. Depression affecting pregnancy Stable on zoloft  3. Herpes simplex  vulvovaginitis Prophylaxis at 36 weeks  4. Short interval between pregnancies affecting pregnancy, antepartum   Preterm labor symptoms and general obstetric precautions including but not limited to vaginal bleeding, contractions, leaking of fluid and fetal movement were reviewed in detail with the patient. Please refer to After Visit Summary for other counseling recommendations.   No follow-ups on file.  No future appointments.  Catalina Antigua, MD

## 2020-11-26 NOTE — BH Specialist Note (Addendum)
Integrated Behavioral Health Follow Up In-Person Visit  MRN: [id number] Name: Jean Solis  Number of Integrated Behavioral Health Clinician visits: 2/6 Session Start time: 2:00pm  Session End time: 2:27pm Total time: 27 minutes in person at [facility]   Types of Service: Individual psychotherapy  Interpretor:No. Interpretor Name and Language: none  Subjective: Jean Solis is a 25 y.o. female accompanied by n/a Patient was referred by Chana Bode MD for anxiety. Patient reports the following symptoms/concerns: anxiety Duration of problem: approx one year ; Severity of problem: mild  Objective: Mood: good and Affect: Appropriate Risk of harm to self or others: No plan to harm self or others  Life Context: Family and Social: Immediate family supportive  School/Work: work at home  Self-Care: none Life Changes: new pregnancy   Patient and/or Family's Strengths/Protective Factors: Concrete supports in place (healthy food, safe environments, etc.)  Goals Addressed: Patient will:  Reduce symptoms of: anxiety   Increase knowledge and/or ability of: coping skills   Demonstrate ability to: Increase healthy adjustment to current life circumstances  Progress towards Goals: Ongoing  Interventions: Interventions utilized:  Supportive Counseling Standardized Assessments completed: PHQ 9  Patient and/or Family Response: Good    Assessment: Patient currently experiencing anxiety affecting pregnancy .   Patient may benefit from integrated behavioral health.  Plan: Follow up with behavioral health clinician on : as needed  Behavioral recommendations: Prioritize rest and task to prevent burnout, keep medical appt and continue taking prenatal vitamins. Incorporate prenatal yoga and mindfulness Referral(s): Integrated [employer] (In Clinic) "From scale of 1-10, how likely are you to follow plan?":    Gwyndolyn Saxon, LCSW

## 2020-12-09 ENCOUNTER — Ambulatory Visit (INDEPENDENT_AMBULATORY_CARE_PROVIDER_SITE_OTHER): Payer: Medicaid Other | Admitting: Family Medicine

## 2020-12-09 ENCOUNTER — Other Ambulatory Visit: Payer: Self-pay

## 2020-12-09 VITALS — BP 117/71 | HR 99 | Wt 224.0 lb

## 2020-12-09 DIAGNOSIS — F32A Depression, unspecified: Secondary | ICD-10-CM

## 2020-12-09 DIAGNOSIS — B009 Herpesviral infection, unspecified: Secondary | ICD-10-CM

## 2020-12-09 DIAGNOSIS — Z3480 Encounter for supervision of other normal pregnancy, unspecified trimester: Secondary | ICD-10-CM

## 2020-12-09 DIAGNOSIS — O99340 Other mental disorders complicating pregnancy, unspecified trimester: Secondary | ICD-10-CM

## 2020-12-09 DIAGNOSIS — Z3009 Encounter for other general counseling and advice on contraception: Secondary | ICD-10-CM

## 2020-12-09 DIAGNOSIS — Z3A33 33 weeks gestation of pregnancy: Secondary | ICD-10-CM

## 2020-12-09 MED ORDER — VALACYCLOVIR HCL 500 MG PO TABS
500.0000 mg | ORAL_TABLET | Freq: Two times a day (BID) | ORAL | 0 refills | Status: DC
Start: 2020-12-09 — End: 2021-01-01

## 2020-12-09 NOTE — Progress Notes (Signed)
   Subjective:  CHANNON BROUGHER is a 25 y.o. G3P2002 at [redacted]w[redacted]d being seen today for ongoing prenatal care.  She is currently monitored for the following issues for this high-risk pregnancy and has Eczema; Pre-diabetes; Genital herpes; Supervision of other normal pregnancy, antepartum; Alpha thalassemia silent carrier; Anxiety; Short interval between pregnancies affecting pregnancy, antepartum; ASCUS with positive high risk HPV cervical; and Depression affecting pregnancy on their problem list.  Patient reports  persistent depression symptoms, somewhat better with Zoloft  .  Contractions: Not present. Vag. Bleeding: None.  Movement: Present. Denies leaking of fluid.   The following portions of the patient's history were reviewed and updated as appropriate: allergies, current medications, past family history, past medical history, past social history, past surgical history and problem list. Problem list updated.  Objective:   Vitals:   12/09/20 1438  BP: 117/71  Pulse: 99  Weight: 224 lb (101.6 kg)    Fetal Status: Fetal Heart Rate (bpm): 150   Movement: Present     General:  Alert, oriented and cooperative. Patient is in no acute distress.  Skin: Skin is warm and dry. No rash noted.   Cardiovascular: Normal heart rate noted  Respiratory: Normal respiratory effort, no problems with respiration noted  Abdomen: Soft, gravid, appropriate for gestational age. Pain/Pressure: Absent     Pelvic: Vag. Bleeding: None     Cervical exam deferred        Extremities: Normal range of motion.  Edema: None  Mental Status: Normal mood and affect. Normal behavior. Normal judgment and thought content.     Assessment and Plan:  Pregnancy: G3P2002 at [redacted]w[redacted]d  1. Supervision of other normal pregnancy, antepartum 2. [redacted] weeks gestation of pregnancy Normal heart tones and fundal height. No other obstetric concerns at this time. - F/up in 2 weeks  2. HSV (herpes simplex virus) infection Taking Valtrex only  for outbreaks. Dx 2020. Currently no outbreak - start suppressive therapy at 36 weeks. Prescription sent to pharmacy  3. Depression affecting pregnancy On Zoloft. Thinks helping. Has connected with BH here before. - message Sue Lush to schedule appt with patient - needs Zoloft refills  4. Contraception counseling Patient with hx of short interval pregnancies (currently has a 3 month old). Would like to space out pregnancies. Planning on liletta PP IUD   Preterm labor symptoms and general obstetric precautions including but not limited to vaginal bleeding, contractions, leaking of fluid and fetal movement were reviewed in detail with the patient. Please refer to After Visit Summary for other counseling recommendations.  Return in about 2 weeks (around 12/23/2020) for LROB, any MD/DO.  Warner Mccreedy, MD, MPH OB Fellow, Faculty [facility]

## 2020-12-23 ENCOUNTER — Ambulatory Visit (INDEPENDENT_AMBULATORY_CARE_PROVIDER_SITE_OTHER): Payer: Medicaid Other | Admitting: Obstetrics & Gynecology

## 2020-12-23 ENCOUNTER — Other Ambulatory Visit: Payer: Self-pay

## 2020-12-23 VITALS — BP 124/79 | HR 95 | Wt 228.8 lb

## 2020-12-23 DIAGNOSIS — F32A Depression, unspecified: Secondary | ICD-10-CM

## 2020-12-23 DIAGNOSIS — Z3480 Encounter for supervision of other normal pregnancy, unspecified trimester: Secondary | ICD-10-CM

## 2020-12-23 DIAGNOSIS — O99340 Other mental disorders complicating pregnancy, unspecified trimester: Secondary | ICD-10-CM

## 2020-12-23 NOTE — Progress Notes (Signed)
° °  PRENATAL VISIT NOTE  Subjective:  Jean Solis is a 25 y.o. G3P2002 at [redacted]w[redacted]d being seen today for ongoing prenatal care.  She is currently monitored for the following issues for this low-risk pregnancy and has Eczema; Pre-diabetes; Genital herpes; Supervision of other normal pregnancy, antepartum; Alpha thalassemia silent carrier; Anxiety; Short interval between pregnancies affecting pregnancy, antepartum; ASCUS with positive high risk HPV cervical; and Depression affecting pregnancy on their problem list.  Patient reports no complaints.  Contractions: Not present. Vag. Bleeding: None.  Movement: Present. Denies leaking of fluid.   The following portions of the patient's history were reviewed and updated as appropriate: allergies, current medications, past family history, past medical history, past social history, past surgical history and problem list.   Objective:   Vitals:   12/23/20 1626  BP: 124/79  Pulse: 95  Weight: 228 lb 12.8 oz (103.8 kg)    Fetal Status: Fetal Heart Rate (bpm): 153 Fundal Height: 35 cm Movement: Present     General:  Alert, oriented and cooperative. Patient is in no acute distress.  Skin: Skin is warm and dry. No rash noted.   Cardiovascular: Normal heart rate noted  Respiratory: Normal respiratory effort, no problems with respiration noted  Abdomen: Soft, gravid, appropriate for gestational age.  Pain/Pressure: Absent     Pelvic: Cervical exam deferred        Extremities: Normal range of motion.     Mental Status: Normal mood and affect. Normal behavior. Normal judgment and thought content.   Assessment and Plan:  Pregnancy: G3P2002 at [redacted]w[redacted]d 1. Supervision of other normal pregnancy, antepartum Wants waterbirth needs to see midwife  2. Depression affecting pregnancy On Zoloft  Preterm labor symptoms and general obstetric precautions including but not limited to vaginal bleeding, contractions, leaking of fluid and fetal movement were reviewed in  detail with the patient. Please refer to After Visit Summary for other counseling recommendations.   Return in about 1 week (around 12/30/2020).  No future appointments.  Scheryl Darter, MD

## 2020-12-31 ENCOUNTER — Ambulatory Visit (INDEPENDENT_AMBULATORY_CARE_PROVIDER_SITE_OTHER): Payer: Medicaid Other | Admitting: Certified Nurse Midwife

## 2020-12-31 ENCOUNTER — Other Ambulatory Visit (HOSPITAL_COMMUNITY)
Admission: RE | Admit: 2020-12-31 | Discharge: 2020-12-31 | Disposition: A | Discharge status: Home / Self Care | Payer: 59 | Source: Ambulatory Visit | Attending: Certified Nurse Midwife | Admitting: Certified Nurse Midwife

## 2020-12-31 ENCOUNTER — Other Ambulatory Visit: Payer: Self-pay

## 2020-12-31 VITALS — BP 111/69 | HR 94 | Wt 234.6 lb

## 2020-12-31 DIAGNOSIS — O98513 Other viral diseases complicating pregnancy, third trimester: Secondary | ICD-10-CM

## 2020-12-31 DIAGNOSIS — Z3480 Encounter for supervision of other normal pregnancy, unspecified trimester: Secondary | ICD-10-CM

## 2020-12-31 DIAGNOSIS — Z3A36 36 weeks gestation of pregnancy: Secondary | ICD-10-CM

## 2020-12-31 DIAGNOSIS — B009 Herpesviral infection, unspecified: Secondary | ICD-10-CM

## 2021-01-01 MED ORDER — VALACYCLOVIR HCL 500 MG PO TABS: 500.0000 mg | ORAL_TABLET | Freq: Two times a day (BID) | ORAL | 0 refills | Status: DC

## 2021-01-01 MED ORDER — ONDANSETRON 4 MG PO TBDP: 4.0000 mg | ORAL_TABLET | Freq: Three times a day (TID) | ORAL | 0 refills | Status: DC | PRN

## 2021-01-01 NOTE — Progress Notes (Signed)
° °  PRENATAL VISIT NOTE  Subjective:  Jean Solis is a 25 y.o. G3P2002 at [redacted]w[redacted]d being seen today for ongoing prenatal care.  She is currently monitored for the following issues for this low-risk pregnancy and has Eczema; Pre-diabetes; Genital herpes; Supervision of other normal pregnancy, antepartum; Alpha thalassemia silent carrier; Anxiety; Short interval between pregnancies affecting pregnancy, antepartum; ASCUS with positive high risk HPV cervical; and Depression affecting pregnancy on their problem list.  Patient reports no complaints.  Contractions: Not present. Vag. Bleeding: None.  Movement: Present. Denies leaking of fluid.   The following portions of the patient's history were reviewed and updated as appropriate: allergies, current medications, past family history, past medical history, past social history, past surgical history and problem list.   Objective:   Vitals:   12/31/20 1627  BP: 111/69  Pulse: 94  Weight: 234 lb 9.6 oz (106.4 kg)    Fetal Status:     Movement: Present     General:  Alert, oriented and cooperative. Patient is in no acute distress.  Skin: Skin is warm and dry. No rash noted.   Cardiovascular: Normal heart rate noted  Respiratory: Normal respiratory effort, no problems with respiration noted  Abdomen: Soft, gravid, appropriate for gestational age.  Pain/Pressure: Present     Pelvic: Cervical exam deferred        Extremities: Normal range of motion.  Edema: None  Mental Status: Normal mood and affect. Normal behavior. Normal judgment and thought content.   Assessment and Plan:  Pregnancy: G3P2002 at [redacted]w[redacted]d 1. Supervision of other normal pregnancy, antepartum - Doing well, feeling regular and vigorous fetal movement  - Consented for waterbirth today, needs to upload certificate or bring to appt/hospital  2. [redacted] weeks gestation of pregnancy - Routine OB care  - Culture, beta strep (group b only) - Cervicovaginal ancillary only( [place])  3.  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  Preterm labor symptoms and general obstetric precautions including but not limited to vaginal bleeding, contractions, leaking of fluid and fetal movement were reviewed in detail with the patient. Please refer to After Visit Summary for other counseling recommendations.   Return in about 1 week (around 01/07/2021) for IN-PERSON, LOB.  Future Appointments  Date Time Provider Department Center  01/15/2021  4:10 PM Gerrit Heck, CNM [facility] None  01/22/2021  4:10 PM Brand Males, CNM [facility] None    Bernerd Limbo, CNM

## 2021-01-02 LAB — CERVICOVAGINAL ANCILLARY ONLY
Chlamydia: NEGATIVE
Comment: NEGATIVE
Comment: NEGATIVE
Comment: NORMAL
Neisseria Gonorrhea: NEGATIVE
Trichomonas: NEGATIVE

## 2021-01-04 LAB — CULTURE, BETA STREP (GROUP B ONLY): Strep Gp B Culture: NEGATIVE

## 2021-01-06 ENCOUNTER — Ambulatory Visit (INDEPENDENT_AMBULATORY_CARE_PROVIDER_SITE_OTHER): Payer: Medicaid Other | Admitting: Obstetrics and Gynecology

## 2021-01-06 ENCOUNTER — Other Ambulatory Visit: Payer: Self-pay

## 2021-01-06 ENCOUNTER — Encounter: Payer: Self-pay | Admitting: Obstetrics and Gynecology

## 2021-01-06 VITALS — BP 134/84 | HR 112 | Wt 237.0 lb

## 2021-01-06 DIAGNOSIS — A6000 Herpesviral infection of urogenital system, unspecified: Secondary | ICD-10-CM

## 2021-01-06 DIAGNOSIS — Z3480 Encounter for supervision of other normal pregnancy, unspecified trimester: Secondary | ICD-10-CM

## 2021-01-06 NOTE — Progress Notes (Signed)
Subjective:  Jean Solis is a 25 y.o. G3P2002 at [redacted]w[redacted]d being seen today for ongoing prenatal care.  She is currently monitored for the following issues for this low-risk pregnancy and has Eczema; Pre-diabetes; Genital herpes; Supervision of other normal pregnancy, antepartum; Alpha thalassemia silent carrier; Anxiety; Short interval between pregnancies affecting pregnancy, antepartum; ASCUS with positive high risk HPV cervical; and Depression affecting pregnancy on their problem list.  Patient reports general discomforts of pregnancy.  Contractions: Irritability. Vag. Bleeding: None.  Movement: Present. Denies leaking of fluid.   The following portions of the patient's history were reviewed and updated as appropriate: allergies, current medications, past family history, past medical history, past social history, past surgical history and problem list. Problem list updated.  Objective:   Vitals:   01/06/21 1616  BP: 134/84  Pulse: (!) 112  Weight: 237 lb (107.5 kg)    Fetal Status: Fetal Heart Rate (bpm): 155   Movement: Present     General:  Alert, oriented and cooperative. Patient is in no acute distress.  Skin: Skin is warm and dry. No rash noted.   Cardiovascular: Normal heart rate noted  Respiratory: Normal respiratory effort, no problems with respiration noted  Abdomen: Soft, gravid, appropriate for gestational age. Pain/Pressure: Present     Pelvic:  Cervical exam deferred        Extremities: Normal range of motion.  Edema: Trace  Mental Status: Normal mood and affect. Normal behavior. Normal judgment and thought content.   Urinalysis:      Assessment and Plan:  Pregnancy: G3P2002 at [redacted]w[redacted]d  1. Supervision of other normal pregnancy, antepartum Stable Labor precautions  2. Genital herpes simplex, unspecified site Continue with suppression  Term labor symptoms and general obstetric precautions including but not limited to vaginal bleeding, contractions, leaking of fluid  and fetal movement were reviewed in detail with the patient. Please refer to After Visit Summary for other counseling recommendations.  Return in about 1 week (around 01/13/2021) for OB visit, face to face, any provider.   Hermina Staggers, MD

## 2021-01-06 NOTE — Progress Notes (Signed)
+   Fetal movement. No complaints.  

## 2021-01-06 NOTE — Patient Instructions (Signed)
Vaginal Delivery ?Vaginal delivery means that you give birth by pushing your baby out of your birth canal (vagina). Your health care team will help you before, during, and after vaginal delivery. ?Birth experiences are unique for every woman and every pregnancy, and birth experiences vary depending on where you choose to give birth. ?What are the risks and benefits? ?Generally, this is safe. However, problems may occur, including: ?Bleeding. ?Infection. ?Damage to other structures such as vaginal tearing. ?Allergic reactions to medicines. ?Despite the risks, benefits of vaginal delivery include less risk of bleeding and infection and a shorter recovery time compared to a Cesarean delivery. Cesarean delivery, or C-section, is the surgical delivery of a baby. ?What happens when I arrive at the birth center or hospital? ?Once you are in labor and have been admitted into the hospital or birth center, your health care team may: ?Review your pregnancy history and any concerns that you have. ?Talk with you about your birth plan and discuss pain control options. ?Check your blood pressure, breathing, and heartbeat. ?Assess your baby's heartbeat. ?Monitor your uterus for contractions. ?Check whether your bag of water (amniotic sac) has broken (ruptured). ?Insert an IV into one of your veins. This may be used to give you fluids and medicines. ?Monitoring ?Your health care team may assess your contractions (uterine monitoring) and your baby's heart rate (fetal monitoring). You may need to be monitored: ?Often, but not continuously (intermittently). ?All the time or for long periods at a time (continuously). Continuous monitoring may be needed if: ?You are taking certain medicines, such as medicine to relieve pain or make your contractions stronger. ?You have pregnancy or labor complications. ?Monitoring may be done by: ?Placing a special stethoscope or a handheld monitoring device on your abdomen to check your baby's heartbeat  and to check for contractions. ?Placing monitors on your abdomen (external monitors) to record your baby's heartbeat and the frequency and length of contractions. ?Placing monitors inside your uterus through your vagina (internal monitors) to record your baby's heartbeat and the frequency, length, and strength of your contractions. Depending on the type of monitor, it may remain in your uterus or on your baby's head until birth. ?Telemetry. This is a type of continuous monitoring that can be done with external or internal monitors. Instead of having to stay in bed, you are able to move around. ?Physical exam ?Your health care team may perform frequent physical exams. This may include: ?Checking how and where your baby is positioned in your uterus. ?Checking your cervix to determine: ?Whether it is thinning out (effacing). ?Whether it is opening up (dilating). ?What happens during labor and delivery? ?Normal labor and delivery is divided into the following three stages: ?Stage 1 ?This is the longest stage of labor. ?Throughout this stage, you will feel contractions. Contractions generally feel mild, infrequent, and irregular at first. They get stronger, more frequent, and more regular as you move through this stage. You may have contractions about every 2-3 minutes. ?This stage ends when your cervix is completely dilated to 4 inches (10 cm) and completely effaced. ?Stage 2 ?This stage starts once your cervix is completely effaced and dilated and lasts until the delivery of your baby. ?This is the stage where you will feel an urge to push your baby out of your vagina. ?You may feel stretching and burning pain, especially when the widest part of your baby's head passes through the vaginal opening (crowning). ?Once your baby is delivered, the umbilical cord will be clamped and   cut. Timing of cutting the cord will depend on your wishes, your baby's health, and your health care provider's practices. ?Your baby will be  placed on your bare chest (skin-to-skin contact) in an upright position and covered with a warm blanket. If you are choosing to breastfeed, watch your baby for feeding cues, like rooting or sucking, and help the baby to your breast for his or her first feeding. ?Stage 3 ?This stage starts immediately after the birth of your baby and ends after you deliver the placenta. ?This stage may take anywhere from 5 to 30 minutes. ?After your baby has been delivered, you will feel contractions as your body expels the placenta. These contractions also help your uterus get smaller and reduce bleeding. ?What can I expect after labor and delivery? ?After labor is over, you and your baby will be assessed closely until you are ready to go home. Your health care team will teach you how to care for yourself and your baby. ?You and your baby may be encouraged to stay in the same room (rooming in) during your hospital stay. This will help promote early bonding and successful breastfeeding. ?Your uterus will be checked and massaged regularly (fundal massage). ?You may continue to receive fluids and medicines through an IV. ?You will have some soreness and pain in your abdomen, vagina, and the area of skin between your vaginal opening and your anus (perineum). ?If an incision was made near your vagina (episiotomy) or if you had some vaginal tearing during delivery, cold compresses may be placed on your episiotomy or your tear. This helps to reduce pain and swelling. ?It is normal to have vaginal bleeding after delivery. Wear a sanitary pad for vaginal bleeding and discharge. ?Summary ?Vaginal delivery means that you will give birth by pushing your baby out of your birth canal (vagina). ?Your health care team will monitor you and your baby throughout the stages of labor. ?After you deliver your baby, your health care team will continue to assess you and your baby to ensure you are both recovering as expected after delivery. ?This  information is not intended to replace advice given to you by your health care provider. Make sure you discuss any questions you have with your health care provider. ?Document Revised: 11/26/2019 Document Reviewed: 11/26/2019 ?Elsevier Patient Education ? 2022 [employer] ? ?

## 2021-01-11 NOTE — L&D Delivery Note (Signed)
Jean Solis is a 26 y.o. female [phone] with IUP at [redacted]w[redacted]d admitted for spontaneous onset of labor.  She progressed with AROM augmentation to complete and pushed 2 times to deliver.  Cord clamping delayed by several minutes then clamped by CNM and cut by FOB.    Delivery Note At 6:57 PM a viable female was delivered via Vaginal, Spontaneous (Presentation: Left Occiput Anterior).  APGAR: 8, 9; weight  .   Placenta status: Spontaneous, Intact.  Cord: 3 vessels with the following complications: None.   Anesthesia: Epidural Episiotomy: None Lacerations:  n/a Suture Repair:  n/a Est. Blood Loss (mL): 200  Mom to postpartum.  Baby to Couplet care / Skin to Skin.  Wende Mott CNM 01/24/2021, 7:35 PM

## 2021-01-15 ENCOUNTER — Ambulatory Visit (INDEPENDENT_AMBULATORY_CARE_PROVIDER_SITE_OTHER): Payer: Medicaid Other

## 2021-01-15 ENCOUNTER — Other Ambulatory Visit: Payer: Self-pay

## 2021-01-15 VITALS — BP 126/77 | HR 123 | Wt 235.6 lb

## 2021-01-15 DIAGNOSIS — Z3A38 38 weeks gestation of pregnancy: Secondary | ICD-10-CM

## 2021-01-15 DIAGNOSIS — Z3480 Encounter for supervision of other normal pregnancy, unspecified trimester: Secondary | ICD-10-CM

## 2021-01-15 NOTE — Progress Notes (Signed)
ROB 38.3wk SVE requested

## 2021-01-15 NOTE — Progress Notes (Signed)
° °  LOW-RISK PREGNANCY OFFICE VISIT  [name] MRN [id number]  Date of birth: April 24, 1995 Chief Complaint:   Routine Prenatal Visit  Subjective:   Jean Solis is a 26 y.o. G6P2002 female at [redacted]w[redacted]d with an Estimated Date of Delivery: 01/26/21 being seen today for ongoing management of a low-risk pregnancy aeb has Eczema; Pre-diabetes; Genital herpes; Supervision of other normal pregnancy, antepartum; Alpha thalassemia silent carrier; Anxiety; Short interval between pregnancies affecting pregnancy, antepartum; ASCUS with positive high risk HPV cervical; and Depression affecting pregnancy on their problem list.  Patient presents today with no complaints.  Patient endorses fetal movement. Patient endorses occasional abdominal cramping or contractions.  Patient denies vaginal concerns including abnormal discharge, leaking of fluid, and bleeding.  Contractions: Irritability. Vag. Bleeding: None.  Movement: Present.  Reviewed past medical,surgical, social, obstetrical and family history as well as problem list, medications and allergies.  Objective   Vitals:   01/15/21 1554  BP: 126/77  Pulse: (!) 123  Weight: 235 lb 9.6 oz (106.9 kg)  Body mass index is 39.21 kg/m.  Total Weight Gain:38 lb 9.6 oz (17.5 kg)         Physical Examination:   General appearance: Well appearing, and in no distress  Mental status: Alert, oriented to person, place, and time  Skin: Warm & dry  Cardiovascular: Normal heart rate noted  Respiratory: Normal respiratory effort, no distress  Abdomen: Soft, gravid, nontender, AGA with Fundal Height: 39 cm  Pelvic: Cervical exam performed  Dilation: 1 Effacement (%): Thick Station: Ballotable Presentation: Vertex  Extremities: Edema: None  Fetal Status: Fetal Heart Rate (bpm): 152  Movement: Present   No results found for this or any previous visit (from the past 24 hour(s)).  Assessment & Plan:  Low-risk pregnancy of a 26 y.o., G3P2002 at [redacted]w[redacted]d  with an Estimated Date of Delivery: 01/26/21   1. Supervision of other normal pregnancy, antepartum -Anticipatory guidance for upcoming appts. -Patient to schedule next appt in one weeks for an inperson visit.   2. [redacted] weeks gestation of pregnancy -Doing well. -Reviewed labor plans.  Informed that not able to have pain medications while in WB tub.  Reviewed usage of nitrous and IV medications.  -Plans for PPLiletta  -Anticipating female infant for circumcision.  -Discussed IOL and WB.  Encouraged to wait for onset of labor as WB can not be guaranteed with induction.    Meds: No orders of the defined types were placed in this encounter.  Labs/procedures today:  Lab Orders  No laboratory test(s) ordered today     Reviewed: Term labor symptoms and general obstetric precautions including but not limited to vaginal bleeding, contractions, leaking of fluid and fetal movement were reviewed in detail with the patient.  All questions were answered.  Follow-up: Return in about 1 week (around 01/22/2021) for [employer].  No orders of the defined types were placed in this encounter.  Maryann Conners MSN, CNM 01/15/2021

## 2021-01-19 ENCOUNTER — Encounter (HOSPITAL_COMMUNITY): Payer: Self-pay | Admitting: Obstetrics and Gynecology

## 2021-01-19 ENCOUNTER — Inpatient Hospital Stay (HOSPITAL_COMMUNITY)
Admission: AD | Admit: 2021-01-19 | Discharge: 2021-01-20 | Disposition: A | Discharge status: Home / Self Care | Payer: 59 | Attending: Obstetrics and Gynecology | Admitting: Obstetrics and Gynecology

## 2021-01-19 ENCOUNTER — Other Ambulatory Visit: Payer: Self-pay

## 2021-01-19 DIAGNOSIS — U071 COVID-19: Secondary | ICD-10-CM

## 2021-01-19 DIAGNOSIS — O09899 Supervision of other high risk pregnancies, unspecified trimester: Secondary | ICD-10-CM

## 2021-01-19 DIAGNOSIS — Z3A39 39 weeks gestation of pregnancy: Secondary | ICD-10-CM | POA: Diagnosis not present

## 2021-01-19 DIAGNOSIS — O98513 Other viral diseases complicating pregnancy, third trimester: Secondary | ICD-10-CM | POA: Diagnosis not present

## 2021-01-19 DIAGNOSIS — Z3689 Encounter for other specified antenatal screening: Secondary | ICD-10-CM

## 2021-01-19 LAB — URINALYSIS, ROUTINE W REFLEX MICROSCOPIC
Bilirubin Urine: NEGATIVE
Glucose, UA: NEGATIVE mg/dL
Hgb urine dipstick: NEGATIVE
Ketones, ur: NEGATIVE mg/dL
Nitrite: NEGATIVE
Protein, ur: NEGATIVE mg/dL
Specific Gravity, Urine: 1.015 (ref 1.005–1.030)
pH: 7.5 (ref 5.0–8.0)

## 2021-01-19 LAB — URINALYSIS, MICROSCOPIC (REFLEX): Bacteria, UA: NONE SEEN

## 2021-01-19 MED ORDER — ACETAMINOPHEN 10 MG/ML IV SOLN
1000.0000 mg | Freq: Four times a day (QID) | INTRAVENOUS | Status: DC
  Administered 2021-01-20: 1000 mg via INTRAVENOUS
  Filled 2021-01-19 (×2): qty 100

## 2021-01-19 MED ORDER — LACTATED RINGERS IV BOLUS
1000.0000 mL | Freq: Once | INTRAVENOUS | Status: AC
  Administered 2021-01-19: 1000 mL via INTRAVENOUS

## 2021-01-19 NOTE — MAU Provider Note (Addendum)
Chief Complaint:  Headache   Event Date/Time   First Provider Initiated Contact with Patient 01/19/21 2233     HPI: Jean Solis is a 26 y.o. G3P2002 at [redacted]w[redacted]d who presents to maternity admissions reporting positive Covid test today with onset of symptoms last night. Both of her children are also positive. She began feeling run down last night, went to work today and began feeling worse with all over body aches, headache and chills. Having mild cramping but no contractions. Took 1500mg  of Tylenol this morning at 0830 (states [facility] told her she can take 3 at a time) which did not help the headache. Has been resting most of the day, without hydrating or eating much. Has some mild nasal congestion and a slight cough, feels SOB when up and walking around. Denies GI upset/symptoms, vaginal bleeding, leaking of fluid, decreased fetal movement, fever, falls, or other recent illness.   Pregnancy Course: Receives care at [facility], prenatal records reviewed.  Past Medical History:  Diagnosis Date   Chlamydia 09/2011   Headache    STD (sexually transmitted disease)    OB History  Gravida Para Term Preterm AB Living  3 2 2  0 0 2  SAB IAB Ectopic Multiple Live Births  0 0 0 0 2    # Outcome Date GA Lbr Len/2nd Weight Sex Delivery Anes PTL Lv  3 Current           2 Term 03/07/20 [redacted]w[redacted]d 03:41 / 00:09 7 lb 15.5 oz (3.615 kg) M Vag-Spont EPI  LIV     Birth Comments: wnl  1 Term 10/11/17 [redacted]w[redacted]d 14:20 / 00:18 7 lb 3.3 oz (3.269 kg) F Vag-Spont EPI  LIV   Past Surgical History:  Procedure Laterality Date   BARTHOLIN CYST MARSUPIALIZATION Right 2012   TONSILLECTOMY     Family History  Problem Relation Age of Onset   Hypertension Mother    Hyperlipidemia Mother    Hyperlipidemia Father    Hypertension Father    Hypertension Maternal Grandmother    Diabetes Maternal Uncle    Social History   Tobacco Use   Smoking status: Never   Smokeless tobacco: Never  Vaping Use   Vaping Use: Never used   Substance Use Topics   Alcohol use: Not Currently    Alcohol/week: 1.0 standard drink    Types: 1 Standard drinks or equivalent per week   Drug use: No   No Known Allergies Medications Prior to Admission  Medication Sig Dispense Refill Last Dose   acetaminophen (TYLENOL) 500 MG tablet Take 1,500 mg by mouth every 6 (six) hours as needed.   01/19/2021 at 2030   sertraline (ZOLOFT) 50 MG tablet Take 1 tablet (50 mg total) by mouth daily. Take 25 mg dose for 1 week, then start 50 mg daily as prescribed 30 tablet 5 Past Month   valACYclovir (VALTREX) 500 MG tablet Take 1 tablet (500 mg total) by mouth 2 (two) times daily. 120 tablet 0 01/19/2021   ondansetron (ZOFRAN-ODT) 4 MG disintegrating tablet Take 1 tablet (4 mg total) by mouth every 8 (eight) hours as needed for nausea or vomiting. (Patient not taking: Reported on 01/06/2021) 15 tablet 0    Prenat-Fe Poly-Methfol-FA-DHA (VITAFOL ULTRA) 29-0.6-0.4-200 MG CAPS Take 1 tablet by mouth daily. (Patient not taking: Reported on 11/25/2020) 30 capsule 12    Prenatal Vit-Fe Phos-FA-Omega (VITAFOL GUMMIES) 3.33-0.333-34.8 MG CHEW Chew 1 tablet by mouth daily. (Patient not taking: Reported on 01/06/2021) 90 tablet 5  I have reviewed patient's Past Medical Hx, Surgical Hx, Family Hx, Social Hx, medications and allergies.   ROS:  Review of Systems  Constitutional:  Positive for chills and fatigue. Negative for fever.  HENT:  Positive for congestion and postnasal drip. Negative for sinus pressure, sinus pain and sore throat.   Respiratory:  Positive for cough and shortness of breath. Negative for chest tightness.   Cardiovascular:  Negative for chest pain.  Gastrointestinal:  Positive for abdominal pain (very mild occasional cramps). Negative for diarrhea, nausea and vomiting.  Genitourinary:  Negative for difficulty urinating, dysuria, hematuria, pelvic pain, vaginal bleeding and vaginal discharge.  Musculoskeletal:  Positive for back pain.   Neurological:  Positive for headaches. Negative for dizziness, syncope and light-headedness.   Physical Exam  Patient Vitals for the past 24 hrs:  BP Temp Temp src Pulse Resp SpO2  01/20/21 0053 (!) 107/46 -- -- (!) 111 -- --  01/20/21 0010 -- -- -- -- -- 98 %  01/20/21 0005 -- -- -- -- -- 98 %  01/20/21 0000 -- -- -- -- -- 99 %  01/19/21 2355 -- -- -- -- -- 99 %  01/19/21 2350 -- -- -- -- -- 98 %  01/19/21 2345 -- -- -- -- -- 98 %  01/19/21 2340 -- -- -- -- -- 98 %  01/19/21 2339 -- -- -- -- -- 98 %  01/19/21 2335 -- -- -- -- -- 98 %  01/19/21 2330 -- -- -- -- -- 97 %  01/19/21 2325 -- -- -- -- -- 98 %  01/19/21 2320 -- -- -- -- -- 97 %  01/19/21 2315 -- -- -- -- -- 99 %  01/19/21 2310 -- -- -- -- -- 97 %  01/19/21 2305 -- -- -- -- -- 97 %  01/19/21 2300 -- -- -- -- -- 99 %  01/19/21 2215 -- -- -- -- -- 99 %  01/19/21 2210 -- -- -- -- -- 97 %  01/19/21 2205 -- -- -- -- -- 98 %  01/19/21 2200 -- -- -- -- -- 98 %  01/19/21 2155 -- -- -- -- -- 98 %  01/19/21 2150 117/70 99.8 F (37.7 C) Oral (!) 130 19 98 %   Physical Exam Constitutional:      General: She is not in acute distress.    Appearance: Normal appearance. She is ill-appearing.  HENT:     Head: Normocephalic and atraumatic.     Nose: Rhinorrhea present.     Mouth/Throat:     Mouth: Mucous membranes are dry.  Eyes:     Pupils: Pupils are equal, round, and reactive to light.  Cardiovascular:     Rate and Rhythm: Regular rhythm. Tachycardia present.     Pulses: Normal pulses.  Pulmonary:     Effort: Pulmonary effort is normal. No respiratory distress.     Breath sounds: No stridor. No wheezing, rhonchi or rales.  Abdominal:     Palpations: Abdomen is soft.     Tenderness: There is no abdominal tenderness.  Musculoskeletal:        General: Normal range of motion.  Skin:    General: Skin is warm and dry.     Capillary Refill: Capillary refill takes less than 2 seconds.  Neurological:     Mental Status: She  is alert and oriented to person, place, and time.  Psychiatric:        Mood and Affect: Mood normal.  Behavior: Behavior normal.        Thought Content: Thought content normal.        Judgment: Judgment normal.   Fetal Tracing: reactive Baseline: 170 (down to 155 after fluids) Variability: moderate Accelerations: 15x15 Decelerations: none Toco: relaxed, some UI   Labs: Results for orders placed or performed during the hospital encounter of 01/19/21 (from the past 24 hour(s))  Urinalysis, Routine w reflex microscopic Urine, Clean Catch     Status: Abnormal   Collection Time: 01/19/21  9:56 PM  Result Value Ref Range   Color, Urine YELLOW YELLOW   APPearance CLEAR CLEAR   Specific Gravity, Urine 1.015 1.005 - 1.030   pH 7.5 5.0 - 8.0   Glucose, UA NEGATIVE NEGATIVE mg/dL   Hgb urine dipstick NEGATIVE NEGATIVE   Bilirubin Urine NEGATIVE NEGATIVE   Ketones, ur NEGATIVE NEGATIVE mg/dL   Protein, ur NEGATIVE NEGATIVE mg/dL   Nitrite NEGATIVE NEGATIVE   Leukocytes,Ua SMALL (A) NEGATIVE  Urinalysis, Microscopic (reflex)     Status: None   Collection Time: 01/19/21  9:56 PM  Result Value Ref Range   RBC / HPF 0-5 0 - 5 RBC/hpf   WBC, UA 0-5 0 - 5 WBC/hpf   Bacteria, UA NONE SEEN NONE SEEN   Squamous Epithelial / LPF 0-5 0 - 5   Mucus PRESENT    Imaging:  No results found.  MAU Course: Orders Placed This Encounter  Procedures   Urinalysis, Routine w reflex microscopic Urine, Clean Catch   Urinalysis, Microscopic (reflex)   Discharge patient   Meds ordered this encounter  Medications   lactated ringers bolus 1,000 mL   acetaminophen (OFIRMEV) IV 1,000 mg    Order Specific Question:   Is the patient UNABLE to take oral / enteral medications?    Answer:   Yes    Order Specific Question:   Does the patient have a contraindication to NSAID use? (If YES, specify on line 3)    Answer:   Yes    Order Specific Question:   Answers to IV acetaminophen criteria above:     Answer:   "Yes" to all criteria.   cyclobenzaprine (FLEXERIL) 10 MG tablet    Sig: Take 1 tablet (10 mg total) by mouth every 8 (eight) hours as needed for muscle spasms.    Dispense:  30 tablet    Refill:  1    Order Specific Question:   Supervising Provider    Answer:   Merrily Pew   MDM: Educated pt about max amount of Tylenol to take q6hr as well as expected course of Covid illness. Declined Paxlovid.   LR bolus + IV tylenol given with good relief of body aches and overall unwell feeling, still has a headache but does not want to take anything that will cause drowsiness. Offered flexeril prescription to be sent to [facility] pharmacy which she accepted. Safe med list given in AVS.  Assessment: 1. COVID-19 virus infection   2. [redacted] weeks gestation of pregnancy   3. NST (non-stress test) reactive     Plan: Discharge home in stable condition with return precautions.    Follow-up Information     [facility] Follow up.   Specialty: Obstetrics and Gynecology Why: as scheduled for ongoing prenatal care Contact information: [address] [place] [phone]                Allergies as of 01/20/2021  No Known Allergies      Medication List     STOP taking these medications    ondansetron 4 MG disintegrating tablet Commonly known as: ZOFRAN-ODT       TAKE these medications    acetaminophen 500 MG tablet Commonly known as: TYLENOL Take 1,500 mg by mouth every 6 (six) hours as needed.   cyclobenzaprine 10 MG tablet Commonly known as: FLEXERIL Take 1 tablet (10 mg total) by mouth every 8 (eight) hours as needed for muscle spasms.   sertraline 50 MG tablet Commonly known as: Zoloft Take 1 tablet (50 mg total) by mouth daily. Take 25 mg dose for 1 week, then start 50 mg daily as prescribed   valACYclovir 500 MG tablet Commonly known as: Valtrex Take 1 tablet (500 mg total) by mouth 2  (two) times daily.   Vitafol Gummies 3.33-0.333-34.8 MG Chew Chew 1 tablet by mouth daily.   Vitafol Ultra 29-0.6-0.4-200 MG Caps Take 1 tablet by mouth daily.       Gaylan Gerold, CNM, MSN, [place] Certified Nurse Midwife, [facility]

## 2021-01-19 NOTE — MAU Note (Signed)
Pt reports she had a positive home Covid test 2 hour ago. Pt reports headache since last night unrelieved by tylenol, shortness of breath, chills, weak. Denies elevated temp. Also reports occasional contractions, denies bleeding. Reports positive fetal movement.

## 2021-01-20 DIAGNOSIS — Z3A39 39 weeks gestation of pregnancy: Secondary | ICD-10-CM

## 2021-01-20 DIAGNOSIS — O98513 Other viral diseases complicating pregnancy, third trimester: Secondary | ICD-10-CM | POA: Diagnosis not present

## 2021-01-20 DIAGNOSIS — U071 COVID-19: Secondary | ICD-10-CM

## 2021-01-20 MED ORDER — CYCLOBENZAPRINE HCL 10 MG PO TABS: 10.0000 mg | ORAL_TABLET | Freq: Three times a day (TID) | ORAL | 1 refills | Status: DC | PRN

## 2021-01-20 MED ORDER — GUAIFENESIN ER 600 MG PO TB12
600.0000 mg | ORAL_TABLET | Freq: Two times a day (BID) | ORAL | 0 refills | Status: DC
Start: 2021-01-20 — End: 2021-01-26

## 2021-01-20 NOTE — Discharge Instructions (Signed)

## 2021-01-22 ENCOUNTER — Telehealth (INDEPENDENT_AMBULATORY_CARE_PROVIDER_SITE_OTHER): Payer: Medicaid Other

## 2021-01-22 DIAGNOSIS — A6000 Herpesviral infection of urogenital system, unspecified: Secondary | ICD-10-CM

## 2021-01-22 DIAGNOSIS — Z3480 Encounter for supervision of other normal pregnancy, unspecified trimester: Secondary | ICD-10-CM

## 2021-01-22 DIAGNOSIS — O98313 Other infections with a predominantly sexual mode of transmission complicating pregnancy, third trimester: Secondary | ICD-10-CM

## 2021-01-22 DIAGNOSIS — Z3A39 39 weeks gestation of pregnancy: Secondary | ICD-10-CM

## 2021-01-22 NOTE — Progress Notes (Signed)
° °  OBSTETRICS PRENATAL VIRTUAL VISIT ENCOUNTER NOTE  Provider location: [facility]   Patient location: Home  I connected with Jean Solis on 01/22/21 at  4:10 PM EST by MyChart Video Encounter and verified that I am speaking with the correct person using two identifiers. I discussed the limitations, risks, security and privacy concerns of performing an evaluation and management service virtually and the availability of in person appointments. I also discussed with the patient that there may be a patient responsible charge related to this service. The patient expressed understanding and agreed to proceed. Subjective:  Jean Solis is a 26 y.o. G3P2002 at [redacted]w[redacted]d being seen today for ongoing prenatal care.  She is currently monitored for the following issues for this low-risk pregnancy and has Eczema; Pre-diabetes; Genital herpes; Supervision of other normal pregnancy, antepartum; Alpha thalassemia silent carrier; Anxiety; Short interval between pregnancies affecting pregnancy, antepartum; ASCUS with positive high risk HPV cervical; and Depression affecting pregnancy on their problem list.  Patient reports no complaints.  Contractions: Irritability. Vag. Bleeding: None.  Movement: Present. Denies any leaking of fluid.   The following portions of the patient's history were reviewed and updated as appropriate: allergies, current medications, past family history, past medical history, past social history, past surgical history and problem list.   Objective:  There were no vitals filed for this visit.  Fetal Status:     Movement: Present     General:  Alert, oriented and cooperative. Patient is in no acute distress.  Respiratory: Normal respiratory effort, no problems with respiration noted  Mental Status: Normal mood and affect. Normal behavior. Normal judgment and thought content.  Rest of physical exam deferred due to type of encounter  Imaging: No results  found.  Assessment and Plan:  Pregnancy: G3P2002 at [redacted]w[redacted]d 1. Supervision of other normal pregnancy, antepartum - Routine OB. Doing well. - Diagnosed with Covid on 1/9, symptoms improving - Endorses active fetal movement - Patient no longer interested in water birth. Wants to be scheduled for IOL and have epidural. Will have patient return to office next week for cervical exam and possible membrane sweep. May scheduled IOL at that time  2. [redacted] weeks gestation of pregnancy  3. Genital herpes simplex, unspecified site - Taking Valtrex  Term labor symptoms and general obstetric precautions including but not limited to vaginal bleeding, contractions, leaking of fluid and fetal movement were reviewed in detail with the patient. I discussed the assessment and treatment plan with the patient. The patient was provided an opportunity to ask questions and all were answered. The patient agreed with the plan and demonstrated an understanding of the instructions. The patient was advised to call back or seek an in-person office evaluation/go to MAU at [facility] for any urgent or concerning symptoms. Please refer to After Visit Summary for other counseling recommendations.   I provided 14 minutes of face-to-face time during this encounter.  Return in about 5 days (around 01/27/2021).    Brand Males, CNM [facility] [employer], [facility]

## 2021-01-22 NOTE — Progress Notes (Signed)
Patient presents for mychart ROB. Patient identified with two patient identifiers. Patient currently has COVID, but states that she is feeling better. Patient would like to be scheduled for induction. Patient has no other concerns today.

## 2021-01-22 NOTE — Patient Instructions (Signed)

## 2021-01-24 ENCOUNTER — Inpatient Hospital Stay (HOSPITAL_COMMUNITY): Payer: 59 | Admitting: Anesthesiology

## 2021-01-24 ENCOUNTER — Other Ambulatory Visit: Payer: Self-pay

## 2021-01-24 ENCOUNTER — Inpatient Hospital Stay (HOSPITAL_COMMUNITY)
Admission: AD | Admit: 2021-01-24 | Discharge: 2021-01-26 | DRG: 805 | Disposition: A | Discharge status: Home / Self Care | Payer: 59 | Attending: Obstetrics and Gynecology | Admitting: Obstetrics and Gynecology

## 2021-01-24 DIAGNOSIS — O09899 Supervision of other high risk pregnancies, unspecified trimester: Secondary | ICD-10-CM | POA: Diagnosis not present

## 2021-01-24 DIAGNOSIS — R87610 Atypical squamous cells of undetermined significance on cytologic smear of cervix (ASC-US): Secondary | ICD-10-CM | POA: Diagnosis present

## 2021-01-24 DIAGNOSIS — A6009 Herpesviral infection of other urogenital tract: Secondary | ICD-10-CM | POA: Diagnosis present

## 2021-01-24 DIAGNOSIS — O99214 Obesity complicating childbirth: Principal | ICD-10-CM | POA: Diagnosis present

## 2021-01-24 DIAGNOSIS — F32A Depression, unspecified: Secondary | ICD-10-CM | POA: Diagnosis present

## 2021-01-24 DIAGNOSIS — O9852 Other viral diseases complicating childbirth: Secondary | ICD-10-CM | POA: Diagnosis not present

## 2021-01-24 DIAGNOSIS — O9832 Other infections with a predominantly sexual mode of transmission complicating childbirth: Secondary | ICD-10-CM | POA: Diagnosis present

## 2021-01-24 DIAGNOSIS — Z30014 Encounter for initial prescription of intrauterine contraceptive device: Secondary | ICD-10-CM | POA: Diagnosis not present

## 2021-01-24 DIAGNOSIS — R87810 Cervical high risk human papillomavirus (HPV) DNA test positive: Secondary | ICD-10-CM | POA: Diagnosis present

## 2021-01-24 DIAGNOSIS — Z3480 Encounter for supervision of other normal pregnancy, unspecified trimester: Secondary | ICD-10-CM

## 2021-01-24 DIAGNOSIS — F419 Anxiety disorder, unspecified: Secondary | ICD-10-CM | POA: Diagnosis present

## 2021-01-24 DIAGNOSIS — U071 COVID-19: Secondary | ICD-10-CM | POA: Diagnosis present

## 2021-01-24 DIAGNOSIS — O99344 Other mental disorders complicating childbirth: Secondary | ICD-10-CM | POA: Diagnosis present

## 2021-01-24 DIAGNOSIS — R69 Illness, unspecified: Secondary | ICD-10-CM | POA: Diagnosis not present

## 2021-01-24 DIAGNOSIS — O98519 Other viral diseases complicating pregnancy, unspecified trimester: Secondary | ICD-10-CM | POA: Diagnosis present

## 2021-01-24 DIAGNOSIS — D563 Thalassemia minor: Secondary | ICD-10-CM | POA: Diagnosis present

## 2021-01-24 DIAGNOSIS — Z3A39 39 weeks gestation of pregnancy: Secondary | ICD-10-CM | POA: Diagnosis not present

## 2021-01-24 DIAGNOSIS — Z30430 Encounter for insertion of intrauterine contraceptive device: Secondary | ICD-10-CM

## 2021-01-24 DIAGNOSIS — O26893 Other specified pregnancy related conditions, third trimester: Secondary | ICD-10-CM | POA: Diagnosis present

## 2021-01-24 DIAGNOSIS — O99820 Streptococcus B carrier state complicating pregnancy: Secondary | ICD-10-CM | POA: Diagnosis not present

## 2021-01-24 DIAGNOSIS — O99340 Other mental disorders complicating pregnancy, unspecified trimester: Secondary | ICD-10-CM | POA: Diagnosis present

## 2021-01-24 DIAGNOSIS — A6000 Herpesviral infection of urogenital system, unspecified: Secondary | ICD-10-CM | POA: Diagnosis present

## 2021-01-24 LAB — RESP PANEL BY RT-PCR (FLU A&B, COVID) ARPGX2
Influenza A by PCR: NEGATIVE
Influenza B by PCR: NEGATIVE
SARS Coronavirus 2 by RT PCR: POSITIVE — AB

## 2021-01-24 LAB — CBC
HCT: 39.3 % (ref 36.0–46.0)
Hemoglobin: 13 g/dL (ref 12.0–15.0)
MCH: 26.7 pg (ref 26.0–34.0)
MCHC: 33.1 g/dL (ref 30.0–36.0)
MCV: 80.7 fL (ref 80.0–100.0)
Platelets: 235 10*3/uL (ref 150–400)
RBC: 4.87 MIL/uL (ref 3.87–5.11)
RDW: 14.9 % (ref 11.5–15.5)
WBC: 10.3 10*3/uL (ref 4.0–10.5)
nRBC: 0 % (ref 0.0–0.2)

## 2021-01-24 LAB — TYPE AND SCREEN
ABO/RH(D): B POS
Antibody Screen: NEGATIVE

## 2021-01-24 MED ORDER — ONDANSETRON HCL 4 MG/2ML IJ SOLN: 4.0000 mg | Freq: Four times a day (QID) | INTRAMUSCULAR | Status: DC | PRN

## 2021-01-24 MED ORDER — ONDANSETRON HCL 4 MG/2ML IJ SOLN: 4.0000 mg | INTRAMUSCULAR | Status: DC | PRN

## 2021-01-24 MED ORDER — SIMETHICONE 80 MG PO CHEW: 80.0000 mg | CHEWABLE_TABLET | ORAL | Status: DC | PRN

## 2021-01-24 MED ORDER — OXYCODONE-ACETAMINOPHEN 5-325 MG PO TABS: 1.0000 | ORAL_TABLET | ORAL | Status: DC | PRN

## 2021-01-24 MED ORDER — DIBUCAINE (PERIANAL) 1 % EX OINT: 1.0000 "application " | TOPICAL_OINTMENT | CUTANEOUS | Status: DC | PRN

## 2021-01-24 MED ORDER — OXYCODONE-ACETAMINOPHEN 5-325 MG PO TABS: 2.0000 | ORAL_TABLET | ORAL | Status: DC | PRN

## 2021-01-24 MED ORDER — DIPHENHYDRAMINE HCL 50 MG/ML IJ SOLN
12.5000 mg | INTRAMUSCULAR | Status: DC | PRN
  Administered 2021-01-24: 12.5 mg via INTRAVENOUS
  Filled 2021-01-24: qty 1

## 2021-01-24 MED ORDER — WITCH HAZEL-GLYCERIN EX PADS: 1.0000 "application " | MEDICATED_PAD | CUTANEOUS | Status: DC | PRN

## 2021-01-24 MED ORDER — ZOLPIDEM TARTRATE 5 MG PO TABS: 5.0000 mg | ORAL_TABLET | Freq: Every evening | ORAL | Status: DC | PRN

## 2021-01-24 MED ORDER — LACTATED RINGERS IV SOLN: 500.0000 mL | Freq: Once | INTRAVENOUS | Status: DC

## 2021-01-24 MED ORDER — IBUPROFEN 600 MG PO TABS
600.0000 mg | ORAL_TABLET | Freq: Four times a day (QID) | ORAL | Status: DC
  Administered 2021-01-24 – 2021-01-26 (×7): 600 mg via ORAL
  Filled 2021-01-24 (×7): qty 1

## 2021-01-24 MED ORDER — EPHEDRINE 5 MG/ML INJ: 10.0000 mg | INTRAVENOUS | Status: DC | PRN

## 2021-01-24 MED ORDER — ACETAMINOPHEN 325 MG PO TABS: 650.0000 mg | ORAL_TABLET | ORAL | Status: DC | PRN

## 2021-01-24 MED ORDER — LIDOCAINE HCL (PF) 1 % IJ SOLN: 30.0000 mL | INTRAMUSCULAR | Status: DC | PRN

## 2021-01-24 MED ORDER — SENNOSIDES-DOCUSATE SODIUM 8.6-50 MG PO TABS
2.0000 | ORAL_TABLET | Freq: Every day | ORAL | Status: DC
  Administered 2021-01-25: 2 via ORAL
  Filled 2021-01-24 (×2): qty 2

## 2021-01-24 MED ORDER — LACTATED RINGERS IV SOLN
500.0000 mL | INTRAVENOUS | Status: DC | PRN
  Administered 2021-01-24: 500 mL via INTRAVENOUS

## 2021-01-24 MED ORDER — PRENATAL MULTIVITAMIN CH
1.0000 | ORAL_TABLET | Freq: Every day | ORAL | Status: DC
  Administered 2021-01-25 – 2021-01-26 (×2): 1 via ORAL
  Filled 2021-01-24 (×2): qty 1

## 2021-01-24 MED ORDER — DIPHENHYDRAMINE HCL 25 MG PO CAPS: 25.0000 mg | ORAL_CAPSULE | Freq: Four times a day (QID) | ORAL | Status: DC | PRN

## 2021-01-24 MED ORDER — LACTATED RINGERS IV SOLN: INTRAVENOUS | Status: DC

## 2021-01-24 MED ORDER — PHENYLEPHRINE 40 MCG/ML (10ML) SYRINGE FOR IV PUSH (FOR BLOOD PRESSURE SUPPORT)
PREFILLED_SYRINGE | INTRAVENOUS | Status: AC
  Filled 2021-01-24: qty 10

## 2021-01-24 MED ORDER — OXYTOCIN BOLUS FROM INFUSION
333.0000 mL | Freq: Once | INTRAVENOUS | Status: AC
  Administered 2021-01-24: 333 mL via INTRAVENOUS

## 2021-01-24 MED ORDER — COCONUT OIL OIL: 1.0000 "application " | TOPICAL_OIL | Status: DC | PRN

## 2021-01-24 MED ORDER — LACTATED RINGERS IV SOLN
500.0000 mL | Freq: Once | INTRAVENOUS | Status: AC
  Administered 2021-01-24: 500 mL via INTRAVENOUS

## 2021-01-24 MED ORDER — LEVONORGESTREL 20.1 MCG/DAY IU IUD
1.0000 | INTRAUTERINE_SYSTEM | Freq: Once | INTRAUTERINE | Status: AC
  Administered 2021-01-24: 1 via INTRAUTERINE
  Filled 2021-01-24: qty 1

## 2021-01-24 MED ORDER — BENZOCAINE-MENTHOL 20-0.5 % EX AERO: 1.0000 "application " | INHALATION_SPRAY | CUTANEOUS | Status: DC | PRN

## 2021-01-24 MED ORDER — LIDOCAINE HCL (PF) 1 % IJ SOLN
INTRAMUSCULAR | Status: DC | PRN
  Administered 2021-01-24: 3 mL via EPIDURAL
  Administered 2021-01-24: 5 mL via EPIDURAL
  Administered 2021-01-24: 2 mL via EPIDURAL

## 2021-01-24 MED ORDER — FENTANYL-BUPIVACAINE-NACL 0.5-0.125-0.9 MG/250ML-% EP SOLN
EPIDURAL | Status: AC
  Filled 2021-01-24: qty 250

## 2021-01-24 MED ORDER — SOD CITRATE-CITRIC ACID 500-334 MG/5ML PO SOLN: 30.0000 mL | ORAL | Status: DC | PRN

## 2021-01-24 MED ORDER — FLEET ENEMA 7-19 GM/118ML RE ENEM: 1.0000 | ENEMA | RECTAL | Status: DC | PRN

## 2021-01-24 MED ORDER — OXYTOCIN-SODIUM CHLORIDE 30-0.9 UT/500ML-% IV SOLN
2.5000 [IU]/h | INTRAVENOUS | Status: DC
  Administered 2021-01-24: 2.5 [IU]/h via INTRAVENOUS
  Filled 2021-01-24: qty 500

## 2021-01-24 MED ORDER — TETANUS-DIPHTH-ACELL PERTUSSIS 5-2.5-18.5 LF-MCG/0.5 IM SUSY: 0.5000 mL | PREFILLED_SYRINGE | Freq: Once | INTRAMUSCULAR | Status: DC

## 2021-01-24 MED ORDER — FENTANYL-BUPIVACAINE-NACL 0.5-0.125-0.9 MG/250ML-% EP SOLN
12.0000 mL/h | EPIDURAL | Status: DC | PRN
  Administered 2021-01-24: 12 mL/h via EPIDURAL

## 2021-01-24 MED ORDER — ONDANSETRON HCL 4 MG PO TABS: 4.0000 mg | ORAL_TABLET | ORAL | Status: DC | PRN

## 2021-01-24 MED ORDER — PHENYLEPHRINE 40 MCG/ML (10ML) SYRINGE FOR IV PUSH (FOR BLOOD PRESSURE SUPPORT): 80.0000 ug | PREFILLED_SYRINGE | INTRAVENOUS | Status: DC | PRN

## 2021-01-24 MED ORDER — ACETAMINOPHEN 325 MG PO TABS
650.0000 mg | ORAL_TABLET | ORAL | Status: DC | PRN
  Administered 2021-01-25 – 2021-01-26 (×2): 650 mg via ORAL
  Filled 2021-01-24 (×2): qty 2

## 2021-01-24 NOTE — MAU Note (Signed)
Started contracting this morning, now every 3 min. No bleeding or leaking. No problems with preg.  Was 1 cm when last checked.  2 prior vag deliveries

## 2021-01-24 NOTE — MAU Provider Note (Signed)
Pt informed that the ultrasound is considered a limited OB ultrasound and is not intended to be a complete ultrasound exam.  Patient also informed that the ultrasound is not being completed with the intent of assessing for fetal or placental anomalies or any pelvic abnormalities.  Explained that the purpose of todays ultrasound is to assess for  presentation (VTX).  Patient acknowledges the purpose of the exam and the limitations of the study.    Marylen Ponto, NP  3:37 PM 01/24/2021

## 2021-01-24 NOTE — H&P (Signed)
OBSTETRIC ADMISSION HISTORY AND PHYSICAL  Jean Solis is a 26 y.o. female G3P2002 with IUP at [redacted]w[redacted]d by LMP presenting for spontaneous onset of labor. She reports +FMs, No LOF, no VB, no blurry vision, headaches or peripheral edema, and RUQ pain.  She plans on breast feeding. She request IUD for birth control. She received her prenatal care at  [facility]    Dating: By LMP --->  Estimated Date of Delivery: 01/26/21  Nursing Staff Provider  Office Location  [facility] Dating    Language  ENGLISH Anatomy US  Incomplete heart anatomy  Flu Vaccine  Declined 09/30/2020 Genetic Screen  NIPS: Low risk Female     AFP:  Negative    TDaP vaccine  10/28/20 Hgb A1C or  GTT Early A1C 5.5 Third trimester 2 hour wnl Component     Latest Ref Rng & Units 10/28/2020  Glucose, Fasting     70 - 91 mg/dL 80  Glucose, 1 hour     70 - 179 mg/dL 80  Glucose, 2 hour     70 - 152 mg/dL 779     Rhogam  N/A   LAB RESULTS     Blood Type B/Positive/-- (07/11 1045)   Feeding Plan  Breastfeed Antibody Negative (07/11 1045)  Contraception  TUBAL Rubella 3.01 (07/11 1045)  Circumcision  YES RPR Non Reactive (07/11 1045)   Pediatrician   [facility] HBsAg Negative (07/11 1045)   Support Person  MOTHER HCVAb Negative  Prenatal Classes no HIV Non Reactive (07/11 1045)     BTL Consent  GBS Positive/-- (02/02 1136) POSITIVE  VBAC Consent  Pap ASCUS + HPV 07/2020    Hgb Electro  Silent carrier alpha-thal  BP Cuff Pt has one at home noted 10/22/19 CF Neg Horizon  PHQ-9/GAD-7  [  ] 28 Weeks  Arly.Keller  ] 36 Weeks SMA Neg Horizon    Waterbirth  [ ]  Class []  Consent [ ]  CNM visit    Induction  [ ]  Orders Entered [ ] Foley Y/N   Prenatal History/Complications: short interval pregnancy, COVID-19  Past Medical History: Past Medical History:  Diagnosis Date   Chlamydia 09/2011   Headache    STD (sexually transmitted disease)     Past Surgical History: Past Surgical History:  Procedure Laterality Date   BARTHOLIN CYST  MARSUPIALIZATION Right 2012   TONSILLECTOMY      Obstetrical History: OB History     Gravida  3   Para  2   Term  2   Preterm  0   AB  0   Living  2      SAB  0   IAB  0   Ectopic  0   Multiple  0   Live Births  2           Social History Social History   Socioeconomic History   Marital status: Single    Spouse name: Not on file   Number of children: 1   Years of education: 13   Highest education level: Not on file  Occupational History   Not on file  Tobacco Use   Smoking status: Never   Smokeless tobacco: Never  Vaping Use   Vaping Use: Never used  Substance and Sexual Activity   Alcohol use: Not Currently    Alcohol/week: 1.0 standard drink    Types: 1 Standard drinks or equivalent per week   Drug use: No   Sexual activity: Yes    Birth  control/protection: None  Other Topics Concern   Not on file  Social History Narrative   Not on file   Social Determinants of Health   Financial Resource Strain: Not on file  Food Insecurity: Not on file  Transportation Needs: Not on file  Physical Activity: Not on file  Stress: Not on file  Social Connections: Not on file    Family History: Family History  Problem Relation Age of Onset   Hypertension Mother    Hyperlipidemia Mother    Hyperlipidemia Father    Hypertension Father    Hypertension Maternal Grandmother    Diabetes Maternal Uncle     Allergies: No Known Allergies  Medications Prior to Admission  Medication Sig Dispense Refill Last Dose   acetaminophen (TYLENOL) 500 MG tablet Take 1,500 mg by mouth every 6 (six) hours as needed.      cyclobenzaprine (FLEXERIL) 10 MG tablet Take 1 tablet (10 mg total) by mouth every 8 (eight) hours as needed for muscle spasms. 30 tablet 1    guaiFENesin (MUCINEX) 600 MG 12 hr tablet Take 1 tablet (600 mg total) by mouth 2 (two) times daily. 20 tablet 0    Prenat-Fe Poly-Methfol-FA-DHA (VITAFOL ULTRA) 29-0.6-0.4-200 MG CAPS Take 1 tablet by mouth  daily. (Patient not taking: Reported on 01/22/2021) 30 capsule 12    Prenatal Vit-Fe Phos-FA-Omega (VITAFOL GUMMIES) 3.33-0.333-34.8 MG CHEW Chew 1 tablet by mouth daily. (Patient not taking: Reported on 01/06/2021) 90 tablet 5    sertraline (ZOLOFT) 50 MG tablet Take 1 tablet (50 mg total) by mouth daily. Take 25 mg dose for 1 week, then start 50 mg daily as prescribed (Patient not taking: Reported on 01/22/2021) 30 tablet 5    valACYclovir (VALTREX) 500 MG tablet Take 1 tablet (500 mg total) by mouth 2 (two) times daily. 120 tablet 0      Review of Systems   All systems reviewed and negative except as stated in HPI  Blood pressure 118/89, pulse (!) 109, not currently breastfeeding. General appearance: alert, cooperative, and no distress Lungs: clear to auscultation bilaterally Heart: regular rate and rhythm Abdomen: soft, non-tender; bowel sounds normal Pelvic: n/a Extremities: Homans sign is negative, no sign of DVT DTR's +2 Presentation: cephalic Fetal monitoringBaseline: 150 bpm, Variability: Good {> 6 bpm), Accelerations: Reactive, and Decelerations: Absent Uterine activityFrequency: Every 2-3 minutes Dilation: 6.5 Effacement (%): 80 Station: -3 Exam by:: jolynn   Prenatal labs: ABO, Rh: B/Positive/-- (07/11 1045) Antibody: Negative (07/11 1045) Rubella: 3.01 (07/11 1045) RPR: Non Reactive (10/18 0844)  HBsAg: Negative (07/11 1045)  HIV: Non Reactive (10/18 0844)  GBS: Negative/-- (12/21 1640)  Prenatal Transfer Tool  Maternal Diabetes: No Genetic Screening: Normal Maternal Ultrasounds/Referrals: Normal Fetal Ultrasounds or other Referrals:  None Maternal Substance Abuse:  No Significant Maternal Medications:  None Significant Maternal Lab Results: Group B Strep negative  No results found for this or any previous visit (from the past 24 hour(s)).  Patient Active Problem List   Diagnosis Date Noted   Depression affecting pregnancy 09/30/2020   ASCUS with positive  high risk HPV cervical 07/29/2020   Short interval between pregnancies affecting pregnancy, antepartum 07/21/2020   Anxiety 04/21/2020   Alpha thalassemia silent carrier 09/27/2019   Supervision of other normal pregnancy, antepartum 08/17/2019   Genital herpes 03/01/2018   Pre-diabetes 07/20/2017   Eczema 12/27/2011    Assessment/Plan:  Jean Solis is a 26 y.o. G3P2002 at 2058w5d here for spontaneous onset of labor  #Labor: Expectant management for  now  COVID-19 diagnosed 1/9- feels that symptoms have improved. Will be on precautions  #Pain: Planning epidural #FWB: Cat 1 #ID:  GBS neg #MOF: Breast #MOC: IUD #Circ:  yes  Rolm Bookbinder, CNM  01/24/2021, 3:40 PM

## 2021-01-24 NOTE — Lactation Note (Addendum)
This note was copied from a baby's chart. Lactation Consultation Note  [name] [id number] Date: 01/24/2021   Age:28 hours LC went in to assist with latching. Mom getting ready to transfer to floor. LC to follow up later on the floor.   LC returned to see how breastfeeding going. Mom requested to be seen by lactation in the morning according to RN, Narda Amber.   Maternal Data    Feeding    LATCH Score                    Lactation Tools Discussed/Used    Interventions    Discharge    Consult Status      Bawi Lakins  Nicholson-Springer 01/24/2021, 8:34 PM

## 2021-01-24 NOTE — Anesthesia Procedure Notes (Addendum)
Epidural Patient location during procedure: OB Start time: 01/24/2021 4:33 PM End time: 01/24/2021 4:40 PM  Staffing Anesthesiologist: Catalina Gravel, MD Performed: anesthesiologist   Preanesthetic Checklist Completed: patient identified, IV checked, risks and benefits discussed, monitors and equipment checked, pre-op evaluation and timeout performed  Epidural Patient position: sitting Prep: DuraPrep Patient monitoring: blood pressure and continuous pulse ox Approach: midline Location: L3-L4 Injection technique: LOR air  Needle:  Needle type: Tuohy  Needle gauge: 17 G Needle length: 9 cm Needle insertion depth: 7 cm Catheter size: 19 Gauge Catheter at skin depth: 12 cm Test dose: negative and Other (1% Lidocaine)  Additional Notes Patient identified.  Risk benefits discussed including failed block, incomplete pain control, headache, nerve damage, paralysis, blood pressure changes, nausea, vomiting, reactions to medication both toxic or allergic, and postpartum back pain.  Patient expressed understanding and wished to proceed.  All questions were answered.  Sterile technique used throughout procedure and epidural site dressed with sterile barrier dressing. No paresthesia or other complications noted. The patient did not experience any signs of intravascular injection such as tinnitus or metallic taste in mouth nor signs of intrathecal spread such as rapid motor block. Please see nursing notes for vital signs. Reason for block:procedure for pain

## 2021-01-24 NOTE — Procedures (Signed)
°  Post-Placental IUD Insertion Procedure Note  Patient identified, informed consent signed prior to delivery, signed copy in chart, time out was performed.    Vaginal, labial and perineal areas thoroughly inspected for lacerations. No laceration identified. Liletta- IUD grasped between sterile gloved fingers. Sterile lubrication applied to sterile gloved hand for ease of insertion. Fundus identified through abdominal wall using non-insertion hand. IUD inserted to fundus with bimanual technique. IUD carefully released at the fundus and insertion hand gently removed from vagina.   Strings trimmed to the level of the introitus. Patient tolerated procedure well.  Patient given post procedure instructions and IUD care card with expiration date.  Patient is asked to keep IUD strings tucked in her vagina until her postpartum follow up visit in 4-6 weeks. Patient advised to abstain from sexual intercourse and pulling on strings before her follow-up visit. Patient verbalized an understanding of the plan of care and agrees.   Wende Mott, CNM 01/24/21 7:38 PM

## 2021-01-24 NOTE — Anesthesia Preprocedure Evaluation (Signed)
Anesthesia Evaluation  Patient identified by MRN, date of birth, ID band Patient awake    Reviewed: Allergy & Precautions, NPO status , Patient's Chart, lab work & pertinent test results  Airway Mallampati: II  TM Distance: >3 FB Neck ROM: Full    Dental  (+) Teeth Intact, Dental Advisory Given BRACES :   Pulmonary neg pulmonary ROS,  COVID+   Pulmonary exam normal breath sounds clear to auscultation       Cardiovascular negative cardio ROS Normal cardiovascular exam Rhythm:Regular Rate:Normal     Neuro/Psych negative neurological ROS     GI/Hepatic negative GI ROS, Neg liver ROS,   Endo/Other  Morbid obesity  Renal/GU negative Renal ROS     Musculoskeletal negative musculoskeletal ROS (+)   Abdominal   Peds  Hematology negative hematology ROS (+) Plt 235k   Anesthesia Other Findings Day of surgery medications reviewed with the patient.  Reproductive/Obstetrics (+) Pregnancy                             Anesthesia Physical Anesthesia Plan  ASA: 2  Anesthesia Plan: Epidural   Post-op Pain Management:    Induction:   PONV Risk Score and Plan: 2 and Treatment may vary due to age or medical condition  Airway Management Planned: Natural Airway  Additional Equipment:   Intra-op Plan:   Post-operative Plan:   Informed Consent: I have reviewed the patients History and Physical, chart, labs and discussed the procedure including the risks, benefits and alternatives for the proposed anesthesia with the patient or authorized representative who has indicated his/her understanding and acceptance.     Dental advisory given  Plan Discussed with:   Anesthesia Plan Comments: (Patient identified. Risks/Benefits/Options discussed with patient including but not limited to bleeding, infection, nerve damage, paralysis, failed block, incomplete pain control, headache, blood pressure changes,  nausea, vomiting, reactions to medication both or allergic, itching and postpartum back pain. Confirmed with bedside nurse the patient's most recent platelet count. Confirmed with patient that they are not currently taking any anticoagulation, have any bleeding history or any family history of bleeding disorders. Patient expressed understanding and wished to proceed. All questions were answered. )        Anesthesia Quick Evaluation

## 2021-01-24 NOTE — Discharge Summary (Signed)
Postpartum Discharge Summary     [name] DOB: 08-16-1995 MRN: [id number]  Date of admission: 01/24/2021 Delivery date:01/24/2021  Delivering provider: Wende Mott  Date of discharge: 01/26/2021  Admitting diagnosis: Normal labor [O80, Z37.9] Intrauterine pregnancy: [redacted]w[redacted]d    Secondary diagnosis:  Principal Problem:   Vaginal delivery Active Problems:   Genital herpes   Supervision of other normal pregnancy, antepartum   Alpha thalassemia silent carrier   Anxiety   Short interval between pregnancies affecting pregnancy, antepartum   ASCUS with positive high risk HPV cervical   Depression affecting pregnancy   COVID-19 affecting pregnancy, antepartum   Normal labor  Additional problems: None    Discharge diagnosis: Term Pregnancy Delivered and COVID-19                                               Post partum procedures: PP IUD Augmentation: AROM Complications: None  Hospital course: Onset of Labor With Vaginal Delivery      26y.o. yo G3P2002 at 380w5das admitted in Active Labor on 01/24/2021. Patient had an uncomplicated labor course as follows:  Membrane Rupture Time/Date:  ,   Delivery Method:Vaginal, Spontaneous  Episiotomy: None  Lacerations:  None  Patient had an uncomplicated postpartum course.  She is ambulating, tolerating a regular diet, passing flatus, and urinating well. Patient is discharged home in stable condition on 01/26/21.  Newborn Data: Birth date:01/24/2021  Birth time:6:57 PM  Gender:Female  Living status:Living  Apgars:8 ,9  Weight:3480 g   Magnesium Sulfate received: No BMZ received: No Rhophylac: N/A MMR: N/A T-DaP: Given prenatally Flu: N/A Transfusion: No  Physical exam  Vitals:   01/25/21 0956 01/25/21 1522 01/25/21 2003 01/26/21 0537  BP: 130/81 112/71 117/74 125/77  Pulse: 88 93 92 97  Resp: _0 Temp: 98.3 F (36.8 C) 97.6 F (36.4 C) (!) 97.4 F (36.3 C) 98.4 F (36.9 C)  TempSrc:   Oral    SpO2:  98%    Weight:      Height:       General: alert, cooperative, and no distress Lochia: appropriate Uterine Fundus: firm and below umbilicus, nontender  DVT Evaluation: no LE edema or calf tenderness to palpation   Labs: Lab Results  Component Value Date   WBC 11.5 (H) 01/25/2021   HGB 10.6 (L) 01/25/2021   HCT 32.3 (L) 01/25/2021   MCV 81.4 01/25/2021   PLT 194 01/25/2021   CMP Latest Ref Rng & Units 05/23/2020  Glucose 70 - 99 mg/dL 103(H)  BUN 6 - 20 mg/dL 7  Creatinine 0.44 - 1.00 mg/dL 0.70  Sodium 135 - 145 mmol/L 138  Potassium 3.5 - 5.1 mmol/L 3.4(L)  Chloride 98 - 111 mmol/L 106  CO2 22 - 32 mmol/L 26  Calcium 8.9 - 10.3 mg/dL 9.3  Total Protein 6.5 - 8.1 g/dL 7.2  Total Bilirubin 0.3 - 1.2 mg/dL 0.8  Alkaline Phos 38 - 126 U/L 74  AST 15 - 41 U/L 19  ALT 0 - 44 U/L 14   Edinburgh Score: Edinburgh Postnatal Depression Scale Screening Tool 01/25/2021  I have Solis able to laugh and see the funny side of things. 0  I have looked forward with enjoyment to things. 0  I have blamed myself unnecessarily when things went wrong. 0  I have Solis anxious or worried for no good reason. 2  I have felt scared or panicky for no good reason. 3  Things have Solis getting on top of me. 2  I have Solis so unhappy that I have had difficulty sleeping. 2  I have felt sad or miserable. 2  I have Solis so unhappy that I have Solis crying. 1  The thought of harming myself has occurred to me. 0  Edinburgh Postnatal Depression Scale Total 12    After visit meds:  Allergies as of 01/26/2021   No Known Allergies      Medication List     STOP taking these medications    cyclobenzaprine 10 MG tablet Commonly known as: FLEXERIL   guaiFENesin 600 MG 12 hr tablet Commonly known as: Mucinex   valACYclovir 500 MG tablet Commonly known as: Valtrex   Vitafol Gummies 3.33-0.333-34.8 MG Chew       TAKE these medications    acetaminophen 500 MG tablet Commonly known  as: TYLENOL Take 2 tablets (1,000 mg total) by mouth every 8 (eight) hours as needed (pain). What changed:  how much to take when to take this reasons to take this   ibuprofen 600 MG tablet Commonly known as: ADVIL Take 1 tablet (600 mg total) by mouth every 6 (six) hours as needed (pain).   sertraline 50 MG tablet Commonly known as: Zoloft Take 1 tablet (50 mg total) by mouth daily. Take 25 mg dose for 1 week, then start 50 mg daily as prescribed   Vitafol Ultra 29-0.6-0.4-200 MG Caps Take 1 tablet by mouth daily.        Discharge home in stable condition Infant Feeding: Breast Infant Disposition: home with mother Discharge instruction: per After Visit Summary and Postpartum booklet. Activity: Advance as tolerated. Pelvic rest for 6 weeks.  Diet: routine diet Future Appointments: Future Appointments  Date Time Provider [place]  03/03/2021  1:10 PM Shelly Bombard, MD [place] None   Follow up Visit: Please schedule this patient for a In person postpartum visit in 4 weeks with the following provider: Any provider. Additional Postpartum F/U: IUD string check, mood check in one week   Low risk pregnancy complicated by:  None Delivery mode:  Vaginal, Spontaneous  Anticipated Birth Control:  PP IUD placed  01/26/2021 Genia Del, MD

## 2021-01-25 ENCOUNTER — Encounter (HOSPITAL_COMMUNITY): Payer: Self-pay | Admitting: Obstetrics and Gynecology

## 2021-01-25 LAB — CBC
HCT: 32.3 % — ABNORMAL LOW (ref 36.0–46.0)
Hemoglobin: 10.6 g/dL — ABNORMAL LOW (ref 12.0–15.0)
MCH: 26.7 pg (ref 26.0–34.0)
MCHC: 32.8 g/dL (ref 30.0–36.0)
MCV: 81.4 fL (ref 80.0–100.0)
Platelets: 194 10*3/uL (ref 150–400)
RBC: 3.97 MIL/uL (ref 3.87–5.11)
RDW: 15 % (ref 11.5–15.5)
WBC: 11.5 10*3/uL — ABNORMAL HIGH (ref 4.0–10.5)
nRBC: 0 % (ref 0.0–0.2)

## 2021-01-25 LAB — RPR: RPR Ser Ql: NONREACTIVE

## 2021-01-25 MED ORDER — OXYCODONE HCL 5 MG PO TABS
5.0000 mg | ORAL_TABLET | ORAL | Status: DC | PRN
  Administered 2021-01-25 (×2): 5 mg via ORAL
  Filled 2021-01-25 (×2): qty 1

## 2021-01-25 NOTE — Lactation Note (Addendum)
This note was copied from a baby's chart. Lactation Consultation Note  [name] GGYIR'S Date: 01/25/2021   Age:28 hours   LC Note:  Previous LC informed me that mother did not desire a lactation consult until day time on Sunday, January 15.    Maternal Data    Feeding    LATCH Score                    Lactation Tools Discussed/Used    Interventions    Discharge    Consult Status      Libertie Hausler R Mellisa Arshad 01/25/2021, 3:27 AM

## 2021-01-25 NOTE — Progress Notes (Signed)
CSW spoke with MOB via room [phone] to complete consult for [place], and mental health. CSW explained role, and reason for consult. MOB was pleasant, and polite during engagement with CSW. MOB reported, she experience anxiety, and depression at the beginning of pregnancy. MOB reported, she experience being sad, anxious, crying, worrying, over thinking, loss of appetite, insomnia, and memory loss. MOB denied any recent therapy involvement. MOB reported, history of Buspar before being switch to Zoloft. MOB reported, her last dose of Zoloft was around Oct 2022. MOB reported, she did not want to continue medication while pregnant. MOB reported, she has been able to manage symptoms without medication. MOB reported, she will resume medication if ever needed. CSW encourage MOB to implement healthy coping skills when symptoms arises.   CSW provided education regarding the baby blues period vs. perinatal mood disorders, discussed treatment and gave resources for mental health follow up if concerns arise. CSW recommends self- evaluation during the postpartum time period using the New Mom Checklist from Postpartum Progress and encouraged MOB to contact a medical professional if symptoms are noted at any time.   MOB reported, since delivery she feels like a "weight has been lifted". MOB reported, her mother is very supportive. MOB denied SI, HI, and DV when CSW assessed for safety.   MOB denied any transportation barriers to follow up infant's care. MOB reported, she has all essentials needed to care for infant. MOB reported, infant has a car seat, and crib. MOB denied any additional barriers.     CSW provided education on sudden infant death syndrome (SIDS).  CSW identifies no further need for intervention or barriers to discharge at this time.  Darcus Austin, MSW, LCSW-A Clinical Social Worker- Weekends [phone]

## 2021-01-25 NOTE — Progress Notes (Signed)
Post Partum Day 1 Subjective: No complaints, up ad lib, voiding, and tolerating PO. No symptoms of COVID. Baby is doing well at bedside.  Breastfeeding.  Objective: Blood pressure 130/81, pulse 88, temperature 98.3 F (36.8 C), resp. rate 19, height 5\' 5"  (1.651 m), weight 106.6 kg, SpO2 98 %, unknown if currently breastfeeding. Physical Exam:  General: alert and no distress Lochia: appropriate Uterine Fundus: firm DVT Evaluation: No evidence of DVT seen on physical exam. Negative Homan's sign. No cords or calf tenderness. No significant calf/ankle edema.  Recent Labs    01/24/21 1601 01/25/21 0446  HGB 13.0 10.6*  HCT 39.3 32.3*    Assessment/Plan: Plan for discharge tomorrow, Breastfeeding, and Contraception IUD (already in place) Patient desires circumcision for her female infant.  Circumcision procedure details discussed, risks and benefits of procedure were also discussed.  These include but are not limited to: Benefits of circumcision in men include reduction in the rates of urinary tract infection (UTI), penile cancer, some sexually transmitted infections, penile inflammatory and retractile disorders, as well as easier hygiene.  Risks include bleeding , infection, injury of glans which may lead to penile deformity or urinary tract issues, unsatisfactory cosmetic appearance and other potential complications related to the procedure.  It was emphasized that this is an elective procedure.  Patient is concerned because her 31 month old son had a problem and Urology had to do the circumcision, she wants Pediatric evaluation before she can sign consent. She was told this was reasonable, we will await Peds decision.   Continue routine postpartum care. 4, M.D.

## 2021-01-25 NOTE — Lactation Note (Signed)
This note was copied from a baby's chart. Lactation Consultation Note  [name] YSAYT'K Date: 01/25/2021 Reason for consult: Initial assessment;Term;Infant weight loss;Breastfeeding assistance;Other (Comment) (Delayed MBU LC visit at mom s request to be seen in this am. 1 % weight loss, per mom had spoon fed at 0845 18ml. baby awake . LC offered to assist / mom receptive, on and off latch at 1st, latched with increased depth, sluggish, good colostrum flow.) Age:19 hours LC reviewed doc flow sheets, per mom baby latched after delivery, since has only latched for few minutes, spoon fed EBM and bottle fed upto 15 ml.  Baby HNS or HNV yet.  LC stressed the importance of giving the baby a lot of practice latching at the breast and hold off on the formula , spoon feed EBM , so the it will enhance the baby to stool and get hungry.     Maternal Data Has patient been taught Hand Expression?: Yes Does the patient have breastfeeding experience prior to this delivery?: Yes How long did the patient breastfeed?: per mom 1st baby 2 months, 2nd baby attempted , milk did come in  Feeding Mother's Current Feeding Choice: Breast Milk and Formula  LATCH Score Latch: Repeated attempts needed to sustain latch, nipple held in mouth throughout feeding, stimulation needed to elicit sucking reflex.  Audible Swallowing: A few with stimulation  Type of Nipple: Everted at rest and after stimulation  Comfort (Breast/Nipple): Soft / non-tender  Hold (Positioning): Assistance needed to correctly position infant at breast and maintain latch.  LATCH Score: 7   Lactation Tools Discussed/Used    Interventions Interventions: Breast feeding basics reviewed;Assisted with latch;Skin to skin;Hand express;Reverse pressure;Adjust position;Support pillows;Position options;Education;LC Services brochure  Discharge Pump: Personal;DEBP  Consult Status Consult Status: Follow-up Date:  01/25/21 Follow-up type: In-patient    Matilde Sprang Priyal Musquiz 01/25/2021, 10:02 AM

## 2021-01-25 NOTE — Anesthesia Postprocedure Evaluation (Signed)
Anesthesia Post Note  Patient: Jean Solis  Procedure(s) Performed: AN AD HOC LABOR EPIDURAL     Patient location during evaluation: Mother Baby Anesthesia Type: Epidural Level of consciousness: awake and alert Pain management: pain level controlled Vital Signs Assessment: post-procedure vital signs reviewed and stable Respiratory status: spontaneous breathing, nonlabored ventilation and respiratory function stable Cardiovascular status: stable Postop Assessment: no headache, no backache, epidural receding, no apparent nausea or vomiting, patient able to bend at knees, adequate PO intake and able to ambulate Anesthetic complications: no   No notable events documented.  Last Vitals:  Vitals:   01/25/21 0300 01/25/21 0632  BP: 122/75 116/75  Pulse: 100 84  Resp: 18 17  Temp: 36.8 C 36.8 C  SpO2: 100% 98%    Last Pain:  Vitals:   01/25/21 0632  TempSrc: Oral  PainSc:    Pain Goal: Patients Stated Pain Goal: 0 (01/24/21 1716)                 Laban Emperor

## 2021-01-26 ENCOUNTER — Encounter: Payer: Self-pay | Admitting: Family Medicine

## 2021-01-26 MED ORDER — ACETAMINOPHEN 500 MG PO TABS: 1000.0000 mg | ORAL_TABLET | Freq: Three times a day (TID) | ORAL | 0 refills | Status: DC | PRN

## 2021-01-26 MED ORDER — IBUPROFEN 600 MG PO TABS: 600.0000 mg | ORAL_TABLET | Freq: Four times a day (QID) | ORAL | 0 refills | Status: DC | PRN

## 2021-02-02 ENCOUNTER — Telehealth: Payer: Self-pay | Admitting: Licensed Clinical Social Worker

## 2021-02-02 ENCOUNTER — Encounter: Payer: Medicaid Other | Admitting: Licensed Clinical Social Worker

## 2021-02-02 ENCOUNTER — Encounter: Payer: Self-pay | Admitting: Licensed Clinical Social Worker

## 2021-02-02 NOTE — Telephone Encounter (Signed)
Called pt regarding schedule mychart visit. Left message requesting callback  

## 2021-02-03 ENCOUNTER — Telehealth: Payer: Self-pay | Admitting: Licensed Clinical Social Worker

## 2021-02-03 NOTE — Telephone Encounter (Signed)
Called pt regarding mychart visit left message

## 2021-02-07 ENCOUNTER — Telehealth (HOSPITAL_COMMUNITY): Payer: Self-pay

## 2021-02-07 DIAGNOSIS — Z1331 Encounter for screening for depression: Secondary | ICD-10-CM

## 2021-02-07 NOTE — Telephone Encounter (Signed)
No answer. Left message to return nurse call.  EPDS score was 12 on 01/25/2021. Referral placed for integrated behavioral health.   Marcelino Duster [facility] 02/07/2021,1245

## 2021-03-03 ENCOUNTER — Ambulatory Visit: Payer: Medicaid Other | Admitting: Obstetrics

## 2021-03-03 ENCOUNTER — Ambulatory Visit (INDEPENDENT_AMBULATORY_CARE_PROVIDER_SITE_OTHER): Payer: 59 | Admitting: Licensed Clinical Social Worker

## 2021-03-03 DIAGNOSIS — F53 Postpartum depression: Secondary | ICD-10-CM

## 2021-03-03 DIAGNOSIS — R69 Illness, unspecified: Secondary | ICD-10-CM | POA: Diagnosis not present

## 2021-03-05 NOTE — BH Specialist Note (Signed)
Integrated Behavioral Health via Telemedicine Visit  03/05/2021 PAT ELICKER [id number]  Number of Integrated Behavioral Health Clinician visits: 4 Session Start time:  1:36pm Session End time: 2:00pm Total time in minutes: 24 mins via phone   Referring Provider: Dr Debroah Loop  Patient/Family location: Home  [facility] Provider location: [facility]  All persons participating in visit: Pt A Cain and LCSW A. Avelyn Touch  Types of Service: Individual psychotherapy and Telephone visit  I connected with [name] and/or Kurtis Bushman Westerfeld's n/a via  Telephone or Video Enabled Telemedicine Application  (Video is Caregility application) and verified that I am speaking with the correct person using two identifiers. Discussed confidentiality: No   I discussed the limitations of telemedicine and the availability of in person appointments.  Discussed there is a possibility of technology failure and discussed alternative modes of communication if that failure occurs.  I discussed that engaging in this telemedicine visit, they consent to the provision of behavioral healthcare and the services will be billed under their insurance.  Patient and/or legal guardian expressed understanding and consented to Telemedicine visit: Yes   Presenting Concerns: Patient and/or family reports the following symptoms/concerns: postpartum depression Duration of problem: approx two months; Severity of problem: mild  Patient and/or Family's Strengths/Protective Factors: Concrete supports in place (healthy food, safe environments, etc.)  Goals Addressed: Patient will:  Reduce symptoms of: depression and stress   Increase knowledge and/or ability of: coping skills and stress reduction   Demonstrate ability to: Increase healthy adjustment to current life circumstances  Progress towards Goals: Ongoing  Interventions: Interventions utilized:  Supportive Counseling Standardized Assessments completed: Edinburgh Postnatal  Depression   Assessment: Patient currently experiencing postpartum depression. Ms. Sinatra reports stress with adjusting to a newborn in the home. Ms. Jia reports she has limited support.   Patient may benefit from integrated behavioral health.  Plan: Follow up with behavioral health clinician on : 3 weeks via mychart  Behavioral recommendations: Create a routinue, sleep with baby naps, prioritize task  Referral(s): Integrated [employer] (In Clinic)  I discussed the assessment and treatment plan with the patient and/or parent/guardian. They were provided an opportunity to ask questions and all were answered. They agreed with the plan and demonstrated an understanding of the instructions.   They were advised to call back or seek an in-person evaluation if the symptoms worsen or if the condition fails to improve as anticipated.  Gwyndolyn Saxon, LCSW

## 2021-03-10 ENCOUNTER — Ambulatory Visit (INDEPENDENT_AMBULATORY_CARE_PROVIDER_SITE_OTHER): Payer: 59 | Admitting: Obstetrics & Gynecology

## 2021-03-10 ENCOUNTER — Other Ambulatory Visit: Payer: Self-pay

## 2021-03-10 VITALS — Ht 65.0 in | Wt 221.3 lb

## 2021-03-10 DIAGNOSIS — F419 Anxiety disorder, unspecified: Secondary | ICD-10-CM | POA: Diagnosis not present

## 2021-03-10 DIAGNOSIS — Z975 Presence of (intrauterine) contraceptive device: Secondary | ICD-10-CM | POA: Diagnosis not present

## 2021-03-10 DIAGNOSIS — R87610 Atypical squamous cells of undetermined significance on cytologic smear of cervix (ASC-US): Secondary | ICD-10-CM

## 2021-03-10 DIAGNOSIS — R87810 Cervical high risk human papillomavirus (HPV) DNA test positive: Secondary | ICD-10-CM

## 2021-03-10 NOTE — Progress Notes (Signed)
° ° °  Post Partum Visit Note  Jean Solis is a 26 y.o. G12P3003 female who presents for a postpartum visit. She is 6 weeks postpartum following a normal spontaneous vaginal delivery.  I have fully reviewed the prenatal and intrapartum course. The delivery was at 39.5 gestational weeks.  Anesthesia: epidural. Postpartum course has been uncomplicated. Baby is doing well yes. Baby is feeding by gerber good start gentle soy. Bleeding staining only. Bowel function is normal. Bladder function is normal. Patient is sexually active. Contraception method is IUD. Postpartum depression screening: positive.   The pregnancy intention screening data noted above was reviewed. Potential methods of contraception were discussed. The patient elected to proceed with No data recorded.    Health Maintenance Due  Topic Date Due   COVID-19 Vaccine (1) Never done   INFLUENZA VACCINE  08/11/2020    The following portions of the patient's history were reviewed and updated as appropriate: allergies, current medications, past family history, past medical history, past social history, past surgical history, and problem list.  Review of Systems Pertinent items are noted in HPI.  Objective:  Ht 5\' 5"  (1.651 m)    Wt 221 lb 4.8 oz (100.4 kg)    LMP  (LMP Unknown) Comment: Beginning of March   BMI 36.83 kg/m    General:  alert, cooperative, and no distress   Breasts:  not indicated  Lungs: Normal effort  Heart:  regular rate and rhythm  Abdomen: soft, non-tender; bowel sounds normal; no masses,  no organomegaly   Wound none  GU exam:  normal      String trimmed to 3 cm Assessment:  IUD (intrauterine device) in place  Anxiety  ASCUS with positive high risk HPV cervical   normal postpartum exam.   Plan:   Essential components of care per ACOG recommendations:  1.  Mood and well being: Patient with positive depression screening today. Reviewed local resources for support.  - Patient tobacco use? No.    - hx of drug use? No.    2. Infant care and feeding:  -Patient currently breastmilk feeding? No.  -Social determinants of health (SDOH) reviewed in EPIC. No concernsThe following needs were identified no  3. Sexuality, contraception and birth spacing - Patient does not want a pregnancy in the next year.  Desired family size is 3 children.  - Reviewed forms of contraception in tiered fashion. Patient desired IUD today.   - Discussed birth spacing of 18 months  4. Sleep and fatigue -Encouraged family/partner/community support of 4 hrs of uninterrupted sleep to help with mood and fatigue  5. Physical Recovery  - Discussed patients delivery and complications. She describes her labor as good. - Patient had a Vaginal, no problems at delivery. Patient had a 1st degree laceration. Perineal healing reviewed. Patient expressed understanding - Patient has urinary incontinence? No. - Patient is safe to resume physical and sexual activity  6.  Health Maintenance - HM due items addressed Yes - Last pap smear  Diagnosis  Date Value Ref Range Status  07/21/2020 (A)  Final   - Atypical squamous cells of undetermined significance (ASC-US)   Pap smear not done at today's visit.  -Breast Cancer screening indicated? No.   7. Chronic Disease/Pregnancy Condition follow up:  colpscopy, behavioral health  - PCP follow up  09/21/2020, MD  Center for [facility], [facility]

## 2021-03-26 ENCOUNTER — Encounter: Payer: Medicaid Other | Admitting: Obstetrics

## 2021-04-09 ENCOUNTER — Encounter: Payer: Medicaid Other | Admitting: Obstetrics

## 2021-05-07 ENCOUNTER — Ambulatory Visit (INDEPENDENT_AMBULATORY_CARE_PROVIDER_SITE_OTHER): Payer: 59 | Admitting: Obstetrics

## 2021-05-07 ENCOUNTER — Other Ambulatory Visit (HOSPITAL_COMMUNITY)
Admission: RE | Admit: 2021-05-07 | Discharge: 2021-05-07 | Disposition: A | Discharge status: Home / Self Care | Payer: 59 | Source: Ambulatory Visit | Attending: Obstetrics | Admitting: Obstetrics

## 2021-05-07 ENCOUNTER — Encounter: Payer: Self-pay | Admitting: Obstetrics

## 2021-05-07 VITALS — BP 127/84 | HR 82 | Wt 218.0 lb

## 2021-05-07 DIAGNOSIS — R87810 Cervical high risk human papillomavirus (HPV) DNA test positive: Secondary | ICD-10-CM | POA: Insufficient documentation

## 2021-05-07 DIAGNOSIS — B9689 Other specified bacterial agents as the cause of diseases classified elsewhere: Secondary | ICD-10-CM | POA: Diagnosis not present

## 2021-05-07 DIAGNOSIS — R69 Illness, unspecified: Secondary | ICD-10-CM | POA: Diagnosis not present

## 2021-05-07 DIAGNOSIS — N760 Acute vaginitis: Secondary | ICD-10-CM | POA: Insufficient documentation

## 2021-05-07 DIAGNOSIS — R87610 Atypical squamous cells of undetermined significance on cytologic smear of cervix (ASC-US): Secondary | ICD-10-CM | POA: Insufficient documentation

## 2021-05-07 DIAGNOSIS — N871 Moderate cervical dysplasia: Secondary | ICD-10-CM | POA: Diagnosis not present

## 2021-05-07 LAB — POCT URINE PREGNANCY: Preg Test, Ur: NEGATIVE

## 2021-05-07 NOTE — Progress Notes (Signed)
Colposcopy Procedure Note ? ?Indications: Pap smear 9 months ago showed: ASCUS with POSITIVE high risk HPV. The prior pap showed no abnormalities.  Prior cervical/vaginal disease: normal exam without visible pathology. Prior cervical treatment: no treatment. ? ?Procedure Details  ?The risks and benefits of the procedure and Written informed consent obtained.  A time-out was performed confirming the patient, procedure and allergy status ? ?Speculum placed in vagina and excellent visualization of cervix achieved, cervix swabbed x 3 with acetic acid solution. ? ?Findings:6 and ?Cervix: no visible lesions; SCJ visualized 360 degrees without lesions, endocervical curettage performed, cervical biopsies taken at 6 and 12 o'clock, specimen labelled and sent to pathology, and hemostasis achieved with Monsel's solution.   ?Vaginal inspection: normal without visible lesions. ?Vulvar colposcopy: vulvar colposcopy not performed. ?  ?Physical Exam ?  ?Specimens: ECC and Cervical Biopsies ? ?Complications: none. ? ?Plan: ?Specimens labelled and sent to Pathology. ?Will base further treatment on Pathology findings. ?Treatment options discussed with patient. ?Post biopsy instructions given to patient. ?Return to discuss Pathology results in 2 weeks.  ? ?Brock Bad, MD ?05/07/2021 11:14 AM  ?

## 2021-05-07 NOTE — Progress Notes (Signed)
Pt presents for colposcopy s/p ASCUS pap with HPV on 07/21/20. ?Pt requests IUD removal. She wants the patch.  ?

## 2021-05-08 LAB — CERVICOVAGINAL ANCILLARY ONLY
Bacterial Vaginitis (gardnerella): POSITIVE — AB
Candida Glabrata: NEGATIVE
Candida Vaginitis: NEGATIVE
Chlamydia: NEGATIVE
Comment: NEGATIVE
Comment: NEGATIVE
Comment: NEGATIVE
Comment: NEGATIVE
Comment: NEGATIVE
Comment: NORMAL
Neisseria Gonorrhea: NEGATIVE
Trichomonas: NEGATIVE

## 2021-05-08 LAB — SURGICAL PATHOLOGY

## 2021-05-12 ENCOUNTER — Other Ambulatory Visit: Payer: Self-pay | Admitting: *Deleted

## 2021-05-12 ENCOUNTER — Other Ambulatory Visit: Payer: Self-pay | Admitting: Obstetrics

## 2021-05-12 DIAGNOSIS — Z30016 Encounter for initial prescription of transdermal patch hormonal contraceptive device: Secondary | ICD-10-CM

## 2021-05-12 MED ORDER — METRONIDAZOLE 500 MG PO TABS: 500.0000 mg | ORAL_TABLET | Freq: Two times a day (BID) | ORAL | 0 refills | Status: DC

## 2021-05-12 MED ORDER — XULANE 150-35 MCG/24HR TD PTWK: 1.0000 | MEDICATED_PATCH | TRANSDERMAL | 12 refills | Status: DC

## 2021-05-12 NOTE — Progress Notes (Signed)
Flagyl sent for BV.

## 2021-05-27 ENCOUNTER — Telehealth (INDEPENDENT_AMBULATORY_CARE_PROVIDER_SITE_OTHER): Payer: 59 | Admitting: Obstetrics

## 2021-05-27 ENCOUNTER — Encounter: Payer: Self-pay | Admitting: Obstetrics

## 2021-05-27 DIAGNOSIS — N871 Moderate cervical dysplasia: Secondary | ICD-10-CM

## 2021-05-27 NOTE — Progress Notes (Signed)
Patient presents for Mychart for follow up colpo. Patient identified with two patient identifiers. Patient has no other concerns today. ?

## 2021-05-27 NOTE — Progress Notes (Signed)
? ? ?  GYNECOLOGY VIRTUAL VISIT ENCOUNTER NOTE ? ?Provider location: [facility] for [facility] [place]  ? ?Patient location: Home ? ?I connected with Jean Solis on 05/27/21 at  9:55 AM EDT by MyChart Video Encounter and verified that I am speaking with the correct person using two identifiers. ?  ?I discussed the limitations, risks, security and privacy concerns of performing an evaluation and management service virtually and the availability of in person appointments. I also discussed with the patient that there may be a patient responsible charge related to this service. The patient expressed understanding and agreed to proceed. ?  ?History:  ?Jean Solis is a 26 y.o. [phone] female being evaluated today for results and management recommendations from colposcopic biopsies.  She has a history of ASCUS with positive HRHPV pap smear.  She denies any abnormal vaginal discharge, bleeding, pelvic pain or other concerns.   ?  ?  ?Past Medical History:  ?Diagnosis Date  ? Chlamydia 09/2011  ? Headache   ? STD (sexually transmitted disease)   ? ?Past Surgical History:  ?Procedure Laterality Date  ? BARTHOLIN CYST MARSUPIALIZATION Right 2012  ? TONSILLECTOMY    ? ?The following portions of the patient's history were reviewed and updated as appropriate: allergies, current medications, past family history, past medical history, past social history, past surgical history and problem list.  ? ?Health Maintenance:  ASCUS pap and positive HRHPV on 07-21-2020.   ? ?Review of Systems:  ?Pertinent items noted in HPI and remainder of comprehensive ROS otherwise negative. ? ?Physical Exam:  ? ?General:  Alert, oriented and cooperative. Patient appears to be in no acute distress.  ?Mental Status: Normal mood and affect. Normal behavior. Normal judgment and thought content.   ?Respiratory: Normal respiratory effort, no problems with respiration noted  ?Rest of physical exam deferred due to type of encounter ? ?Labs and  Imaging ?No results found for this or any previous visit (from the past 336 hour(s)). ?No results found.   ?  ?Assessment and Plan:  ?   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 ?   ?  ?I discussed the assessment and treatment plan with the patient. The patient was provided an opportunity to ask questions and all were answered. The patient agreed with the plan and demonstrated an understanding of the instructions. ?  ?The patient was advised to call back or seek an in-person evaluation/go to the ED if the symptoms worsen or if the condition fails to improve as anticipated. ? ?I have spent a total of 15 minutes of face-to-face and non-face-to-face time, excluding clinical staff time, reviewing notes and preparing to see patient, ordering tests and/or medications, and counseling the patient.  ? ? ?Coral Ceo, MD ?[facility] for [facility], [facility], [facility] ?05/27/21  ?

## 2021-06-29 ENCOUNTER — Other Ambulatory Visit: Payer: Self-pay | Admitting: Obstetrics

## 2021-06-29 DIAGNOSIS — Z30016 Encounter for initial prescription of transdermal patch hormonal contraceptive device: Secondary | ICD-10-CM

## 2021-07-17 ENCOUNTER — Telehealth: Payer: Self-pay | Admitting: Licensed Clinical Social Worker

## 2021-07-17 ENCOUNTER — Encounter: Payer: Medicaid Other | Admitting: Licensed Clinical Social Worker

## 2021-07-17 NOTE — Telephone Encounter (Signed)
Called pt regarding scheduled mychart visit. Left message requesting callback  

## 2021-08-26 ENCOUNTER — Encounter (HOSPITAL_COMMUNITY): Payer: Self-pay | Admitting: *Deleted

## 2021-08-26 ENCOUNTER — Inpatient Hospital Stay (HOSPITAL_COMMUNITY)
Admission: AD | Admit: 2021-08-26 | Discharge: 2021-08-26 | Disposition: A | Discharge status: Home / Self Care | Payer: No Typology Code available for payment source | Attending: Obstetrics & Gynecology | Admitting: Obstetrics & Gynecology

## 2021-08-26 ENCOUNTER — Inpatient Hospital Stay (HOSPITAL_COMMUNITY): Payer: No Typology Code available for payment source

## 2021-08-26 DIAGNOSIS — R1030 Lower abdominal pain, unspecified: Secondary | ICD-10-CM | POA: Diagnosis not present

## 2021-08-26 DIAGNOSIS — Z3A01 Less than 8 weeks gestation of pregnancy: Secondary | ICD-10-CM | POA: Diagnosis not present

## 2021-08-26 DIAGNOSIS — Z3490 Encounter for supervision of normal pregnancy, unspecified, unspecified trimester: Secondary | ICD-10-CM

## 2021-08-26 DIAGNOSIS — Z3201 Encounter for pregnancy test, result positive: Secondary | ICD-10-CM | POA: Diagnosis not present

## 2021-08-26 DIAGNOSIS — O26891 Other specified pregnancy related conditions, first trimester: Secondary | ICD-10-CM | POA: Insufficient documentation

## 2021-08-26 HISTORY — DX: Unspecified abnormal cytological findings in specimens from vagina: R87.629

## 2021-08-26 HISTORY — DX: Depression, unspecified: F32.A

## 2021-08-26 HISTORY — DX: Anxiety disorder, unspecified: F41.9

## 2021-08-26 HISTORY — DX: Umbilical hernia without obstruction or gangrene: K42.9

## 2021-08-26 LAB — HIV ANTIBODY (ROUTINE TESTING W REFLEX): HIV Screen 4th Generation wRfx: NONREACTIVE

## 2021-08-26 LAB — CBC
HCT: 38.3 % (ref 36.0–46.0)
Hemoglobin: 12.5 g/dL (ref 12.0–15.0)
MCH: 26.8 pg (ref 26.0–34.0)
MCHC: 32.6 g/dL (ref 30.0–36.0)
MCV: 82 fL (ref 80.0–100.0)
Platelets: 373 10*3/uL (ref 150–400)
RBC: 4.67 MIL/uL (ref 3.87–5.11)
RDW: 13.2 % (ref 11.5–15.5)
WBC: 5.2 10*3/uL (ref 4.0–10.5)
nRBC: 0 % (ref 0.0–0.2)

## 2021-08-26 LAB — URINALYSIS, ROUTINE W REFLEX MICROSCOPIC
Bilirubin Urine: NEGATIVE
Glucose, UA: NEGATIVE mg/dL
Hgb urine dipstick: NEGATIVE
Ketones, ur: NEGATIVE mg/dL
Nitrite: NEGATIVE
Protein, ur: NEGATIVE mg/dL
Specific Gravity, Urine: 1.026 (ref 1.005–1.030)
pH: 6 (ref 5.0–8.0)

## 2021-08-26 LAB — WET PREP, GENITAL
Clue Cells Wet Prep HPF POC: NONE SEEN
Sperm: NONE SEEN
Trich, Wet Prep: NONE SEEN
WBC, Wet Prep HPF POC: >=10 — AB (ref <10)
Yeast Wet Prep HPF POC: NONE SEEN

## 2021-08-26 LAB — HCG, QUANTITATIVE, PREGNANCY: hCG, Beta Chain, Quant, S: 68302 m[IU]/mL — ABNORMAL HIGH (ref <5)

## 2021-08-26 LAB — POCT PREGNANCY, URINE: Preg Test, Ur: POSITIVE — AB

## 2021-08-26 NOTE — MAU Note (Signed)
Jean Solis is a 26 y.o. at Unknown here in MAU reporting: been really tired, been having pain in lower abd and around belly button. Been going on for 3 wks.  Thought it was from last preg.  Did a test and it was positive (last wk). No bleeding.  LMP: end of June Onset of complaint: 3 wks ago Pain score: 5- lower abd only  Lab orders placed from triage:  urine

## 2021-08-26 NOTE — MAU Note (Signed)
Blood work collected while in triage.  Pt to self swab.

## 2021-08-26 NOTE — MAU Provider Note (Signed)
History     CSN: [id number]  Arrival date and time: 08/26/21 0854   Event Date/Time   First Provider Initiated Contact with Patient 08/26/21 1056      Chief Complaint  Patient presents with   Abdominal Pain   Possible Pregnancy   HPI [name] is a 26 y.o. year old [id number] female at [redacted]w[redacted]d weeks gestation who presents to MAU reporting feeling "really tired"with lower abdominal pain x 3 weeks; pain rated 5/10. She reports she thought it was from her last pregnancy; delivered in January of 2023. She reports having a ppIUD placed after that last delivery, but "it hurt way too bad and I wanted it out. She switched to the patch and has used it consistently and correctly. She denies VB. She received [facility] with [facility], but does not desire to keep this pregnancy. She questions what abortion options can be done here or through her OB office.    OB History     Gravida  4   Para  3   Term  3   Preterm  0   AB  0   Living  3      SAB  0   IAB  0   Ectopic  0   Multiple  0   Live Births  3           Past Medical History:  Diagnosis Date   Anxiety    Chlamydia 09/2011   Depression    on meds, emotions up and down   Headache    STD (sexually transmitted disease)    Umbilical hernia    dx with prev preg   Vaginal Pap smear, abnormal    +in last preg, had colpo- they were talking about freezing the cells    Past Surgical History:  Procedure Laterality Date   BARTHOLIN CYST MARSUPIALIZATION Right 2012   TONSILLECTOMY      Family History  Problem Relation Age of Onset   Hypertension Mother    Hyperlipidemia Mother    Thyroid disease Mother    Diabetes Maternal Uncle    Hypertension Maternal Grandmother     Social History   Tobacco Use   Smoking status: Never   Smokeless tobacco: Never  Vaping Use   Vaping Use: Never used  Substance Use Topics   Alcohol use: Not Currently    Alcohol/week: 1.0 standard drink of alcohol    Types: 1 Standard  drinks or equivalent per week   Drug use: No    Allergies: No Known Allergies  Medications Prior to Admission  Medication Sig Dispense Refill Last Dose   acetaminophen (TYLENOL) 500 MG tablet Take 2 tablets (1,000 mg total) by mouth every 8 (eight) hours as needed (pain). 60 tablet 0 08/25/2021   norelgestromin-ethinyl estradiol [employer]) 150-35 MCG/24HR transdermal patch Place 1 patch onto the skin once a week. 3 patch 12 Past Month   sertraline (ZOLOFT) 50 MG tablet Take 1 tablet (50 mg total) by mouth daily. Take 25 mg dose for 1 week, then start 50 mg daily as prescribed 30 tablet 5 Past Week   valACYclovir (VALTREX) 500 MG tablet Take 500 mg by mouth 2 (two) times daily.   Past Week   ibuprofen (ADVIL) 600 MG tablet Take 1 tablet (600 mg total) by mouth every 6 (six) hours as needed (pain). 40 tablet 0    metroNIDAZOLE (FLAGYL) 500 MG tablet Take 1 tablet (500 mg total) by mouth 2 (two) times daily. 14  tablet 0    Prenat-Fe Poly-Methfol-FA-DHA (VITAFOL ULTRA) 29-0.6-0.4-200 MG CAPS Take 1 tablet by mouth daily. 30 capsule 12     Review of Systems  Constitutional:  Positive for fatigue.  HENT: Negative.    Eyes: Negative.   Respiratory: Negative.    Cardiovascular: Negative.   Gastrointestinal: Negative.  Abdominal pain: periumbilical pain.  Endocrine: Negative.   Genitourinary:  Positive for pelvic pain (cramping).  Musculoskeletal: Negative.   Skin: Negative.   Allergic/Immunologic: Negative.   Neurological: Negative.   Hematological: Negative.   Psychiatric/Behavioral: Negative.     Physical Exam   Blood pressure 126/75, pulse 81, temperature 98.5 F (36.9 C), temperature source Oral, resp. rate 16, height 5\' 5"  (1.651 m), weight 101.9 kg, last menstrual period 07/07/2021, SpO2 97 %, unknown if currently breastfeeding.  Physical Exam  MAU Course  Procedures  MDM CCUA UPT CBC ABO/Rh HCG Wet Prep GC/CT -- pending HIV -- pending OB < 14 wks 07/09/2021 with TV  Results  for orders placed or performed during the hospital encounter of 08/26/21 (from the past 24 hour(s))  Pregnancy, urine POC     Status: Abnormal   Collection Time: 08/26/21  9:05 AM  Result Value Ref Range   Preg Test, Ur POSITIVE (A) NEGATIVE  CBC     Status: None   Collection Time: 08/26/21  9:33 AM  Result Value Ref Range   WBC 5.2 4.0 - 10.5 K/uL   RBC 4.67 3.87 - 5.11 MIL/uL   Hemoglobin 12.5 12.0 - 15.0 g/dL   HCT 08/28/21 00.1 - 74.9 %   MCV 82.0 80.0 - 100.0 fL   MCH 26.8 26.0 - 34.0 pg   MCHC 32.6 30.0 - 36.0 g/dL   RDW 44.9 67.5 - 91.6 %   Platelets 373 150 - 400 K/uL   nRBC 0.0 0.0 - 0.2 %  ABO/Rh     Status: None   Collection Time: 08/26/21  9:33 AM  Result Value Ref Range   ABO/RH(D)      B POS Performed at [facility], [address], [place], [place]   hCG, quantitative, pregnancy     Status: Abnormal   Collection Time: 08/26/21  9:33 AM  Result Value Ref Range   hCG, Beta Chain, Quant, S 68,302 (H) <5 mIU/mL  Wet prep, genital     Status: Abnormal   Collection Time: 08/26/21  9:48 AM   Specimen: Vaginal  Result Value Ref Range   Yeast Wet Prep HPF POC NONE SEEN NONE SEEN   Trich, Wet Prep NONE SEEN NONE SEEN   Clue Cells Wet Prep HPF POC NONE SEEN NONE SEEN   WBC, Wet Prep HPF POC >=10 (A) <10   Sperm NONE SEEN   Urinalysis, Routine w reflex microscopic Urine, Clean Catch     Status: Abnormal   Collection Time: 08/26/21 10:04 AM  Result Value Ref Range   Color, Urine YELLOW YELLOW   APPearance HAZY (A) CLEAR   Specific Gravity, Urine 1.026 1.005 - 1.030   pH 6.0 5.0 - 8.0   Glucose, UA NEGATIVE NEGATIVE mg/dL   Hgb urine dipstick NEGATIVE NEGATIVE   Bilirubin Urine NEGATIVE NEGATIVE   Ketones, ur NEGATIVE NEGATIVE mg/dL   Protein, ur NEGATIVE NEGATIVE mg/dL   Nitrite NEGATIVE NEGATIVE   Leukocytes,Ua SMALL (A) NEGATIVE   RBC / HPF 0-5 0 - 5 RBC/hpf   WBC, UA 0-5 0 - 5 WBC/hpf   Bacteria, UA RARE (A) NONE SEEN  Squamous Epithelial /  LPF 11-20 0 - 5   Mucus PRESENT     US OB LESS THAN 14 WEEKS WITH OB TRANSVAGINAL  Result Date: 08/26/2021 CLINICAL DATA:  Lower abdominal pain with positive pregnancy test. EXAM: OBSTETRIC <14 WK [place] AND TRANSVAGINAL OB US TECHNIQUE: Both transabdominal and transvaginal ultrasound examinations were performed for complete evaluation of the gestation as well as the maternal uterus, adnexal regions, and pelvic cul-de-sac. Transvaginal technique was performed to assess early pregnancy. COMPARISON:  None Available. FINDINGS: Intrauterine gestational sac: Single Yolk sac:  Visualized. Embryo:  Visualized Cardiac Activity: Visualized Heart Rate: 140 bpm CRL:  9.1 mm   6 w   6 d                  [place] EDC: 04/15/2022 Subchorionic hemorrhage:  None visualized. Maternal uterus/adnexae: Maternal ovaries are unremarkable. Trace intraperitoneal free fluid evident. IMPRESSION: Single living intrauterine gestation at estimated 6 week 6 day gestational age by crown-rump length. Electronically Signed   By: Kennith Center M.D.   On: 08/26/2021 10:06      Assessment and Plan  1. Intrauterine pregnancy   2. [redacted] weeks gestation of pregnancy   - Discharge patient - Advised to research local abortion options - Patient verbalized an understanding of the plan of care and agrees.    Raelyn Mora, CNM 08/26/2021, 10:56 AM

## 2021-08-27 LAB — ABO/RH: ABO/RH(D): B POS

## 2021-08-27 LAB — GC/CHLAMYDIA PROBE AMP (~~LOC~~) NOT AT ARMC
Chlamydia: NEGATIVE
Comment: NEGATIVE
Comment: NORMAL
Neisseria Gonorrhea: NEGATIVE

## 2021-08-28 ENCOUNTER — Ambulatory Visit: Payer: 59 | Admitting: Radiology

## 2021-09-02 DIAGNOSIS — Z3201 Encounter for pregnancy test, result positive: Secondary | ICD-10-CM | POA: Diagnosis not present

## 2021-09-18 ENCOUNTER — Encounter: Payer: Self-pay | Admitting: Obstetrics and Gynecology

## 2021-09-18 NOTE — Progress Notes (Signed)
26 y.o. [id number] Single Black or African American Not Hispanic or Latino female here for annual exam. Same partner x 2021, no pain with sex.  She had an abortion at the end of August. She is on the patch. Interested in the nexplanon. She has used the nexplanon in the past and did well. Currently on the patch.  Prior to the Abortion her cycles were normal.  Pt has many questions today re: pap/colpo results/recommendations done @ [facility].   She had a colposcopy and biopsy at another GYN in 4/23  (pap ASCUS, +HPV in 7/22). 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 Cryo was recommended.  She had a Baby in 1/23.    H/O HSV, on suppression, recently started getting oral HSV.   No LMP recorded. Patient is pregnant.          Sexually active: Yes.    The current method of family planning is patch.    Exercising: No.     Smoker:  no  Health Maintenance: Pap: 07/21/2020-ASCUS, HPV+, 08/27/2019-WNL, HPV- neg History of abnormal Pap: yes, 07/21/2020-ASCUS, HPV+, Colpo-05/07/21-CIN 1/2 MMG: never BMD: never Colonoscopy: never TDaP: 10/28/2020 Gardasil: never    reports that she has never smoked. She has never used smokeless tobacco. She reports current alcohol use of about 1.0 standard drink of alcohol per week. She reports that she does not use drugs. Daughter is almost 4. Sons are 1.5 and 8 months. They go to daycare. 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   Past Medical History:  Diagnosis Date   Anxiety    Chlamydia 09/2011   Depression    on meds, emotions up and down   Headache    STD (sexually transmitted disease)    Umbilical hernia    dx with prev preg   Vaginal Pap smear, abnormal    +in last preg, had colpo- they were talking about freezing the cells    Past Surgical History:  Procedure Laterality Date   BARTHOLIN CYST MARSUPIALIZATION Right 2012   TONSILLECTOMY      Current Outpatient Medications  Medication Sig Dispense Refill   ibuprofen  (ADVIL) 800 MG tablet Take 800 mg by mouth every 8 (eight) hours as needed.     sertraline (ZOLOFT) 50 MG tablet Take 1 tablet (50 mg total) by mouth daily. Take 25 mg dose for 1 week, then start 50 mg daily as prescribed 30 tablet 5   valACYclovir (VALTREX) 500 MG tablet Take 500 mg by mouth 2 (two) times daily.     No current facility-administered medications for this visit.    Family History  Problem Relation Age of Onset   Hypertension Mother    Hyperlipidemia Mother    Thyroid disease Mother    Diabetes Maternal Uncle    Hypertension Maternal Grandmother     Review of Systems  All other systems reviewed and are negative.   Exam:   BP 98/62   Pulse 100   Ht 5\' 5"  (1.651 m) Comment: per pt  Wt 224 lb (101.6 kg)   SpO2 97%   BMI 37.28 kg/m   Weight change: @WEIGHTCHANGE @ Height:   Height: 5\' 5"  (165.1 cm) (per pt)  Ht Readings from Last 3 Encounters:  10/02/21 5\' 5"  (1.651 m)  08/26/21 5\' 5"  (1.651 m)  03/10/21 5\' 5"  (1.651 m)    General appearance: alert, cooperative and appears stated age Head: Normocephalic, without obvious abnormality, atraumatic Neck: no adenopathy,  supple, symmetrical, trachea midline and thyroid normal to inspection and palpation Lungs: clear to auscultation bilaterally Cardiovascular: regular rate and rhythm Breasts: normal appearance, no masses or tenderness Abdomen: soft, non-tender; non distended,  no masses,  no organomegaly Extremities: extremities normal, atraumatic, no cyanosis or edema Skin: Skin color, texture, turgor normal. No rashes or lesions Lymph nodes: Cervical, supraclavicular, and axillary nodes normal. No abnormal inguinal nodes palpated Neurologic: Grossly normal   Pelvic: External genitalia:  no lesions              Urethra:  normal appearing urethra with no masses, tenderness or lesions              Bartholins and Skenes: normal                 Vagina: normal appearing vagina with normal color and discharge, no  lesions              Cervix: no lesions               Bimanual Exam:  Uterus:   no masses or tenderness              Adnexa: no mass, fullness, tenderness               Rectovaginal: Confirms               Anus:  normal sphincter tone, no lesions  Ladona Ridgel, CMA chaperoned for the exam.  1. Well woman exam Discussed breast self exam Discussed calcium and vit D intake  2. Screening for cervical cancer - Cytology - PAP - RPR - HIV Antibody (routine testing w rflx) - Hepatitis C antibody  3. History of cervical dysplasia - Cytology - PAP -CIN 2 in 4/23, discussed option of leep vs repeat pap and colpo. Wants repeat pap and colpo  4. Vitamin D deficiency - VITAMIN D 25 Hydroxy (Vit-D Deficiency, Fractures)  5. Pre-diabetes - Hemoglobin A1c  6. Herpes simplex infection of genitourinary system - valACYclovir (VALTREX) 500 MG tablet; Take one tablet po qd, increase to one tablet po BID x 3 days as needed.  Dispense: 90 tablet; Refill: 4  7. Depression with anxiety - sertraline (ZOLOFT) 100 MG tablet; Take half a tablet a day for one week, if tolerating, then increase to one tablet a day  Dispense: 90 tablet; Refill: 3  8. Oral ulcer - SureSwab HSV, Type 1/2 DNA, PCR  9. General counseling and advice on female contraception - Insertion of implanon rod; Future

## 2021-10-02 ENCOUNTER — Encounter: Payer: Self-pay | Admitting: Obstetrics and Gynecology

## 2021-10-02 ENCOUNTER — Ambulatory Visit (INDEPENDENT_AMBULATORY_CARE_PROVIDER_SITE_OTHER): Payer: 59 | Admitting: Obstetrics and Gynecology

## 2021-10-02 ENCOUNTER — Other Ambulatory Visit (HOSPITAL_COMMUNITY)
Admission: RE | Admit: 2021-10-02 | Discharge: 2021-10-02 | Disposition: A | Discharge status: Home / Self Care | Payer: 59 | Source: Ambulatory Visit | Attending: Obstetrics and Gynecology | Admitting: Obstetrics and Gynecology

## 2021-10-02 VITALS — BP 98/62 | HR 100 | Ht 65.0 in | Wt 224.0 lb

## 2021-10-02 DIAGNOSIS — Z87410 Personal history of cervical dysplasia: Secondary | ICD-10-CM

## 2021-10-02 DIAGNOSIS — A6000 Herpesviral infection of urogenital system, unspecified: Secondary | ICD-10-CM

## 2021-10-02 DIAGNOSIS — Z124 Encounter for screening for malignant neoplasm of cervix: Secondary | ICD-10-CM | POA: Insufficient documentation

## 2021-10-02 DIAGNOSIS — R7303 Prediabetes: Secondary | ICD-10-CM | POA: Diagnosis not present

## 2021-10-02 DIAGNOSIS — K121 Other forms of stomatitis: Secondary | ICD-10-CM | POA: Diagnosis not present

## 2021-10-02 DIAGNOSIS — E559 Vitamin D deficiency, unspecified: Secondary | ICD-10-CM

## 2021-10-02 DIAGNOSIS — Z01419 Encounter for gynecological examination (general) (routine) without abnormal findings: Secondary | ICD-10-CM

## 2021-10-02 DIAGNOSIS — R69 Illness, unspecified: Secondary | ICD-10-CM | POA: Diagnosis not present

## 2021-10-02 DIAGNOSIS — Z3009 Encounter for other general counseling and advice on contraception: Secondary | ICD-10-CM

## 2021-10-02 DIAGNOSIS — F418 Other specified anxiety disorders: Secondary | ICD-10-CM

## 2021-10-02 MED ORDER — SERTRALINE HCL 100 MG PO TABS: ORAL_TABLET | ORAL | 3 refills | Status: DC

## 2021-10-02 MED ORDER — VALACYCLOVIR HCL 500 MG PO TABS: ORAL_TABLET | ORAL | 4 refills | Status: DC

## 2021-10-02 NOTE — Patient Instructions (Signed)

## 2021-10-05 LAB — SURESWAB HSV, TYPE 1/2 DNA, PCR
HSV 1 DNA: NOT DETECTED
HSV 2 DNA: NOT DETECTED

## 2021-10-05 LAB — VITAMIN D 25 HYDROXY (VIT D DEFICIENCY, FRACTURES): Vit D, 25-Hydroxy: 5 ng/mL — ABNORMAL LOW (ref 30–100)

## 2021-10-05 LAB — HEMOGLOBIN A1C
Hgb A1c MFr Bld: 5.5 % of total Hgb (ref <5.7)
Mean Plasma Glucose: 111 mg/dL
eAG (mmol/L): 6.2 mmol/L

## 2021-10-05 LAB — HEPATITIS C ANTIBODY: Hepatitis C Ab: NONREACTIVE

## 2021-10-05 LAB — HIV ANTIBODY (ROUTINE TESTING W REFLEX): HIV 1&2 Ab, 4th Generation: NONREACTIVE

## 2021-10-05 LAB — RPR: RPR Ser Ql: NONREACTIVE

## 2021-10-06 LAB — CYTOLOGY - PAP
Chlamydia: NEGATIVE
Comment: NEGATIVE
Comment: NEGATIVE
Comment: NEGATIVE
Comment: NORMAL
High risk HPV: POSITIVE — AB
Neisseria Gonorrhea: NEGATIVE
Trichomonas: NEGATIVE

## 2021-10-07 ENCOUNTER — Other Ambulatory Visit: Payer: Self-pay

## 2021-10-07 DIAGNOSIS — E559 Vitamin D deficiency, unspecified: Secondary | ICD-10-CM

## 2021-10-07 MED ORDER — VITAMIN D (ERGOCALCIFEROL) 1.25 MG (50000 UNIT) PO CAPS: 50000.0000 [IU] | ORAL_CAPSULE | ORAL | 0 refills | Status: DC

## 2021-10-26 ENCOUNTER — Ambulatory Visit: Payer: 59 | Admitting: Physician Assistant

## 2021-11-04 ENCOUNTER — Other Ambulatory Visit: Payer: Self-pay | Admitting: *Deleted

## 2021-11-04 DIAGNOSIS — Z87410 Personal history of cervical dysplasia: Secondary | ICD-10-CM

## 2021-11-05 ENCOUNTER — Ambulatory Visit (INDEPENDENT_AMBULATORY_CARE_PROVIDER_SITE_OTHER): Payer: 59 | Admitting: Obstetrics and Gynecology

## 2021-11-05 ENCOUNTER — Other Ambulatory Visit (HOSPITAL_COMMUNITY)
Admission: RE | Admit: 2021-11-05 | Discharge: 2021-11-05 | Disposition: A | Discharge status: Home / Self Care | Payer: 59 | Source: Ambulatory Visit | Attending: Obstetrics and Gynecology | Admitting: Obstetrics and Gynecology

## 2021-11-05 ENCOUNTER — Encounter: Payer: Self-pay | Admitting: Obstetrics and Gynecology

## 2021-11-05 VITALS — BP 136/78 | HR 88 | Wt 222.0 lb

## 2021-11-05 DIAGNOSIS — Z87410 Personal history of cervical dysplasia: Secondary | ICD-10-CM

## 2021-11-05 DIAGNOSIS — Z01812 Encounter for preprocedural laboratory examination: Secondary | ICD-10-CM | POA: Diagnosis not present

## 2021-11-05 DIAGNOSIS — N870 Mild cervical dysplasia: Secondary | ICD-10-CM | POA: Diagnosis not present

## 2021-11-05 LAB — PREGNANCY, URINE: Preg Test, Ur: NEGATIVE

## 2021-11-05 NOTE — Progress Notes (Signed)
GYNECOLOGY  VISIT   HPI: 26 y.o.   Single Black or African American Not Hispanic or Latino  female   [phone] with Patient's last menstrual period was 10/19/2021.   here for a colposcopy  She had a colposcopy and biopsy at another GYN in 4/23  (pap ASCUS, +HPV in 7/22). 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 Cryo was recommended. She presented here in 9/23 for a second opinion. I discussed the options of leep or repeat pap and colpo. She desired a repeat pap and colpo. Her pap from 10/02/21 returned with LSIL/+HPV   She had a Baby in 1/23.   Prior pap from 8/21 was normal, negative hpv Pap from 3/19 was negative No prior abnormal paps  GYNECOLOGIC HISTORY: Patient's last menstrual period was 10/19/2021. Contraception:patch Menopausal hormone therapy: no        OB History     Gravida  5   Para  3   Term  3   Preterm  0   AB  1   Living  3      SAB  0   IAB  0   Ectopic  0   Multiple  0   Live Births  3              Patient Active Problem List   Diagnosis Date Noted   ASCUS with positive high risk HPV cervical 07/29/2020   Anxiety 04/21/2020   Alpha thalassemia silent carrier 09/27/2019   Genital herpes 03/01/2018   IUD (intrauterine device) in place 12/07/2017   Pre-diabetes 07/20/2017   Eczema 12/27/2011    Past Medical History:  Diagnosis Date   Anxiety    Chlamydia 09/2011   Depression    on meds, emotions up and down   Headache    STD (sexually transmitted disease)    Umbilical hernia    dx with prev preg   Vaginal Pap smear, abnormal    +in last preg, had colpo- they were talking about freezing the cells    Past Surgical History:  Procedure Laterality Date   BARTHOLIN CYST MARSUPIALIZATION Right 2012   TONSILLECTOMY      Current Outpatient Medications  Medication Sig Dispense Refill   ibuprofen (ADVIL) 800 MG tablet Take 800 mg by mouth every 8 (eight) hours as needed.     sertraline (ZOLOFT) 100 MG tablet Take half a tablet a day  for one week, if tolerating, then increase to one tablet a day 90 tablet 3   valACYclovir (VALTREX) 500 MG tablet Take one tablet po qd, increase to one tablet po BID x 3 days as needed. 90 tablet 4   Vitamin D, Ergocalciferol, (DRISDOL) 1.25 MG (50000 UNIT) CAPS capsule Take 1 capsule (50,000 Units total) by mouth every 7 (seven) days. 12 capsule 0   XULANE 150-35 MCG/24HR transdermal patch 1 patch once a week.     No current facility-administered medications for this visit.     ALLERGIES: Patient has no known allergies.  Family History  Problem Relation Age of Onset   Hypertension Mother    Hyperlipidemia Mother    Thyroid disease Mother    Diabetes Maternal Uncle    Hypertension Maternal Grandmother     Social History   Socioeconomic History   Marital status: Single    Spouse name: Not on file   Number of children: 1   Years of education: 13   Highest education level: Not on file  Occupational History   Not  on file  Tobacco Use   Smoking status: Never   Smokeless tobacco: Never  Vaping Use   Vaping Use: Never used  Substance and Sexual Activity   Alcohol use: Yes    Alcohol/week: 1.0 standard drink of alcohol    Types: 1 Standard drinks or equivalent per week    Comment: occasionally.   Drug use: No   Sexual activity: Yes    Birth control/protection: Patch    Comment: HSV  Other Topics Concern   Not on file  Social History Narrative   Not on file   Social Determinants of Health   Financial Resource Strain: Not on file  Food Insecurity: Not on file  Transportation Needs: No Transportation Needs (10/10/2017)   PRAPARE - Administrator, Civil Service (Medical): No    Lack of Transportation (Non-Medical): No  Physical Activity: Inactive (10/10/2017)   Exercise Vital Sign    Days of Exercise per Week: 0 days    Minutes of Exercise per Session: 0 min  Stress: No Stress Concern Present (10/10/2017)   [employer] of Occupational Health -  Occupational Stress Questionnaire    Feeling of Stress : Only a little  Social Connections: Not on file  Intimate Partner Violence: Not on file    ROS  PHYSICAL EXAMINATION:    BP 136/78   Pulse 88   Wt 222 lb (100.7 kg)   LMP 10/19/2021   SpO2 100%   Breastfeeding Unknown   BMI 36.94 kg/m     General appearance: alert, cooperative and appears stated age   Pelvic: External genitalia:  no lesions              Urethra:  normal appearing urethra with no masses, tenderness or lesions              Bartholins and Skenes: normal                 Vagina: normal appearing vagina with normal color and discharge, no lesions              Cervix: no lesions  Colposcopy: satisfactory, mild aceto-white changes, biopsies taken at 4, 9 and 1 o'clock, ECC done. No punctations or mosaics seen. Negative lugols examination of the upper cervix.                 Chaperone was present for exam.  1. History of cervical dysplasia She had a colposcopy in 4/23 (at another practice) with CIN I and focal CIN II, she didn't get treatment. Recent pap with LSIL/+HPV - Colposcopy - Surgical pathology( / [facility])  2. Pre-procedure lab exam - Pregnancy, urine

## 2021-11-05 NOTE — Patient Instructions (Signed)

## 2021-11-09 LAB — SURGICAL PATHOLOGY

## 2021-11-10 ENCOUNTER — Ambulatory Visit (INDEPENDENT_AMBULATORY_CARE_PROVIDER_SITE_OTHER): Payer: 59 | Admitting: Obstetrics and Gynecology

## 2021-11-10 ENCOUNTER — Encounter: Payer: Self-pay | Admitting: Obstetrics and Gynecology

## 2021-11-10 VITALS — BP 126/78

## 2021-11-10 DIAGNOSIS — Z01812 Encounter for preprocedural laboratory examination: Secondary | ICD-10-CM

## 2021-11-10 DIAGNOSIS — Z30017 Encounter for initial prescription of implantable subdermal contraceptive: Secondary | ICD-10-CM | POA: Diagnosis not present

## 2021-11-10 DIAGNOSIS — Z3009 Encounter for other general counseling and advice on contraception: Secondary | ICD-10-CM

## 2021-11-10 DIAGNOSIS — N879 Dysplasia of cervix uteri, unspecified: Secondary | ICD-10-CM | POA: Diagnosis not present

## 2021-11-10 LAB — PREGNANCY, URINE: Preg Test, Ur: NEGATIVE

## 2021-11-10 NOTE — Progress Notes (Signed)
GYNECOLOGY  VISIT   HPI: 26 y.o.   Single Black or African American Not Hispanic or Latino  female   [phone] with Patient's last menstrual period was 10/19/2021.   here for nexplanon insertion.  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  GYNECOLOGIC HISTORY: Patient's last menstrual period was 10/19/2021. Contraception:contraception patch Menopausal hormone therapy: no        OB History     Gravida  5   Para  3   Term  3   Preterm  0   AB  1   Living  3      SAB  0   IAB  0   Ectopic  0   Multiple  0   Live Births  3              Patient Active Problem List   Diagnosis Date Noted   ASCUS with positive high risk HPV cervical 07/29/2020   Anxiety 04/21/2020   Alpha thalassemia silent carrier 09/27/2019   Genital herpes 03/01/2018   IUD (intrauterine device) in place 12/07/2017   Pre-diabetes 07/20/2017   Eczema 12/27/2011    Past Medical History:  Diagnosis Date   Anxiety    Chlamydia 09/2011   Depression    on meds, emotions up and down   Headache    STD (sexually transmitted disease)    Umbilical hernia    dx with prev preg   Vaginal Pap smear, abnormal    +in last preg, had colpo- they were talking about freezing the cells    Past Surgical History:  Procedure Laterality Date   BARTHOLIN CYST MARSUPIALIZATION Right 2012   TONSILLECTOMY      Current Outpatient Medications  Medication Sig Dispense Refill   ibuprofen (ADVIL) 800 MG tablet Take 800 mg by mouth every 8 (eight) hours as needed.     sertraline (ZOLOFT) 100 MG tablet Take half a tablet a day for one week, if tolerating, then increase to one tablet a day 90 tablet 3   valACYclovir (VALTREX) 500 MG tablet Take one tablet po qd, increase to one tablet po BID x 3 days as needed. 90 tablet 4   Vitamin D, Ergocalciferol, (DRISDOL) 1.25 MG (50000 UNIT) CAPS capsule  Take 1 capsule (50,000 Units total) by mouth every 7 (seven) days. 12 capsule 0   XULANE 150-35 MCG/24HR transdermal patch 1 patch once a week.     No current facility-administered medications for this visit.     ALLERGIES: Patient has no known allergies.  Family History  Problem Relation Age of Onset   Hypertension Mother    Hyperlipidemia Mother    Thyroid disease Mother    Diabetes Maternal Uncle    Hypertension Maternal Grandmother     Social History   Socioeconomic History   Marital status: Single    Spouse name: Not on file   Number of children: 1   Years of education: 13   Highest education level: Not on file  Occupational History   Not on file  Tobacco Use   Smoking status: Never   Smokeless tobacco: Never  Vaping Use   Vaping Use: Never used  Substance and Sexual Activity   Alcohol use: Yes    Alcohol/week: 1.0 standard drink of alcohol    Types: 1 Standard drinks or equivalent per week  Comment: occasionally.   Drug use: No   Sexual activity: Yes    Birth control/protection: Patch    Comment: HSV  Other Topics Concern   Not on file  Social History Narrative   Not on file   Social Determinants of Health   Financial Resource Strain: Not on file  Food Insecurity: Not on file  Transportation Needs: No Transportation Needs (10/10/2017)   PRAPARE - Administrator, Civil Service (Medical): No    Lack of Transportation (Non-Medical): No  Physical Activity: Inactive (10/10/2017)   Exercise Vital Sign    Days of Exercise per Week: 0 days    Minutes of Exercise per Session: 0 min  Stress: No Stress Concern Present (10/10/2017)   [employer] of Occupational Health - Occupational Stress Questionnaire    Feeling of Stress : Only a little  Social Connections: Not on file  Intimate Partner Violence: Not on file    ROS  PHYSICAL EXAMINATION:    LMP 10/19/2021     General appearance: alert, cooperative and appears stated age  Risks of  nexplanon removal and reinsertion were reviewed with the patient and a consent was signed.  The patient was placed in the supine position with her left arm bent at the elbow. The area was cleansed with Hibiclens and injected with 1% lidocaine along the path where the nexplanon would be placed. The nexplanon device was inserted in the usual fashion without difficulty. Slight oozing from the insertion site was stopped with pressure. The device was palpated in place.  The patients arm was cleansed of Hibiclens and a steri strip was placed over the incision. A gauze was wrapped around her arm.  She tolerated the procedure well  Instructions for care were discussed.    1. General counseling and advice on female contraception Nexplanon placed. 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 She presented here last month for an annual exam and second opinion. Repeat pap returned with LSIL/+HPV. Colposcopy done last week was satisfactory, 3 biopsies were done, all returned with CIN I. ECC was negative. -Reviewed option of leep or f/u colpo and pap q 6 months for a total of 2 years.

## 2021-11-18 NOTE — Telephone Encounter (Signed)
This encounter was created in error - please disregard.

## 2021-12-23 ENCOUNTER — Other Ambulatory Visit: Payer: Self-pay | Admitting: Obstetrics and Gynecology

## 2021-12-23 DIAGNOSIS — E559 Vitamin D deficiency, unspecified: Secondary | ICD-10-CM

## 2022-01-01 ENCOUNTER — Other Ambulatory Visit: Payer: 59

## 2022-01-06 ENCOUNTER — Other Ambulatory Visit: Payer: Medicaid Other

## 2022-01-06 DIAGNOSIS — E559 Vitamin D deficiency, unspecified: Secondary | ICD-10-CM

## 2022-01-07 LAB — VITAMIN D 25 HYDROXY (VIT D DEFICIENCY, FRACTURES): Vit D, 25-Hydroxy: 47 ng/mL (ref 30–100)

## 2022-01-08 IMAGING — US US OB < 14 WEEKS - US OB TV
1 series · 15 of 28 positions shown · non-contrast
Comparison: None available.

CLINICAL DATA: Initial evaluation for acute pain, positive beta
HCG.

EXAM:
OBSTETRIC <14 WK US AND TRANSVAGINAL OB US
TECHNIQUE: Both transabdominal and transvaginal ultrasound examinations were
performed for complete evaluation of the gestation as well as the
maternal uterus, adnexal regions, and pelvic cul-de-sac.
Transvaginal technique was performed to assess early pregnancy.

[Series 1: us ob < 14 weeks - us ob tv · 15 of 70 slices shown]
[im 1/70]
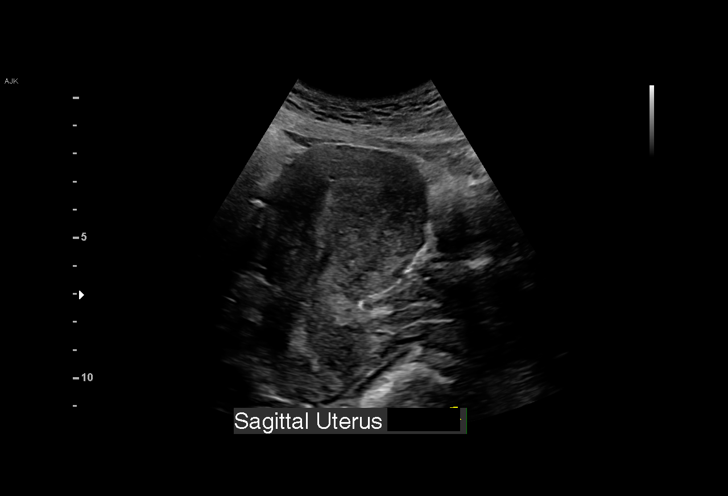
[im 6/70]
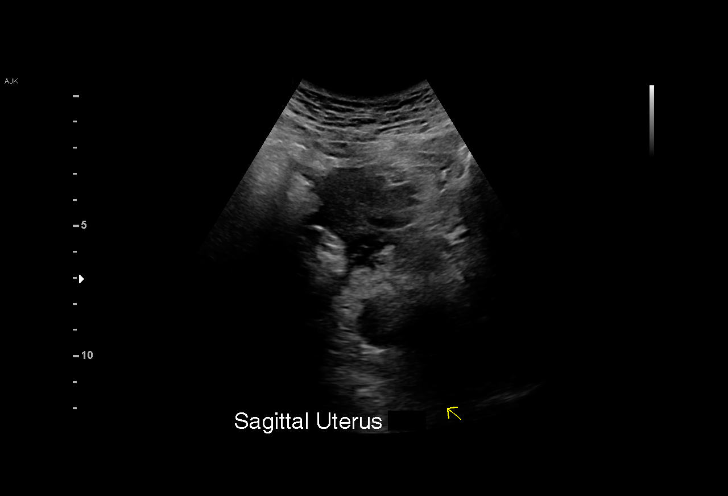
[im 11/70]
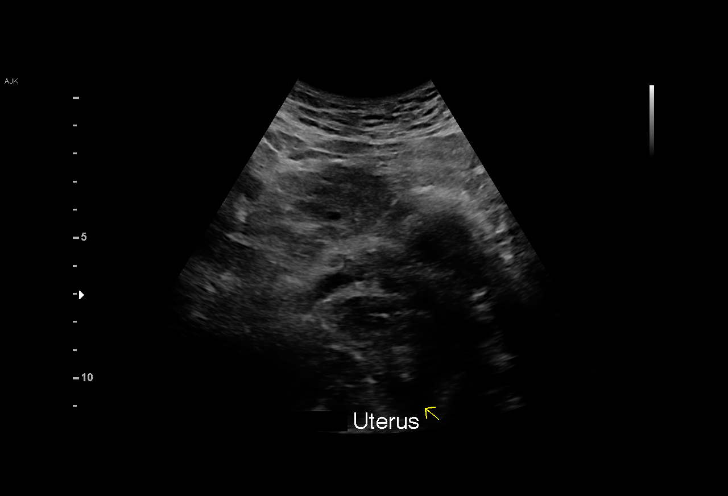
[im 16/70]
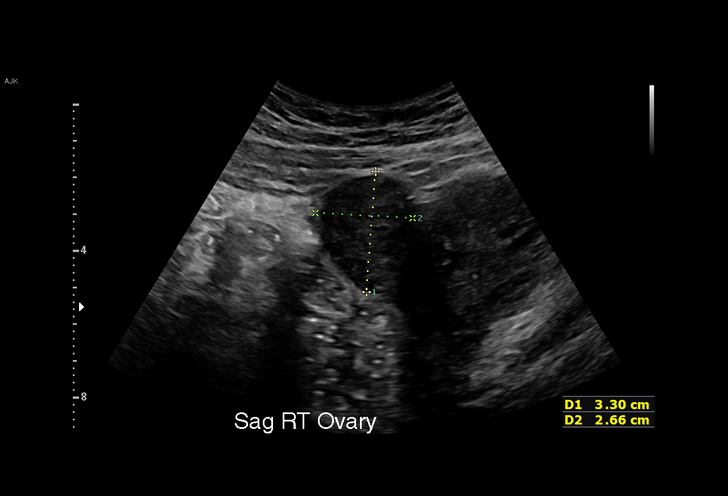
[im 21/70]
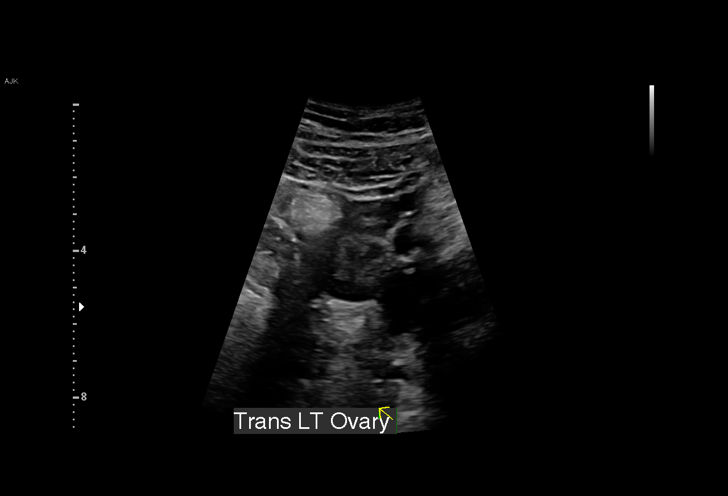
[im 26/70]
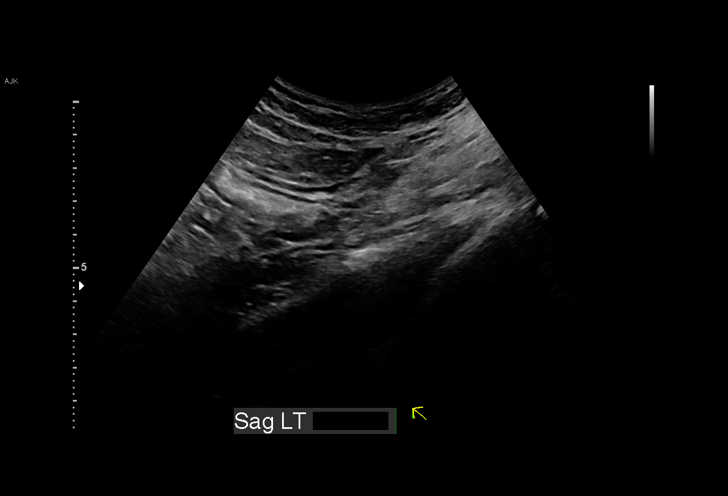
[im 31/70]
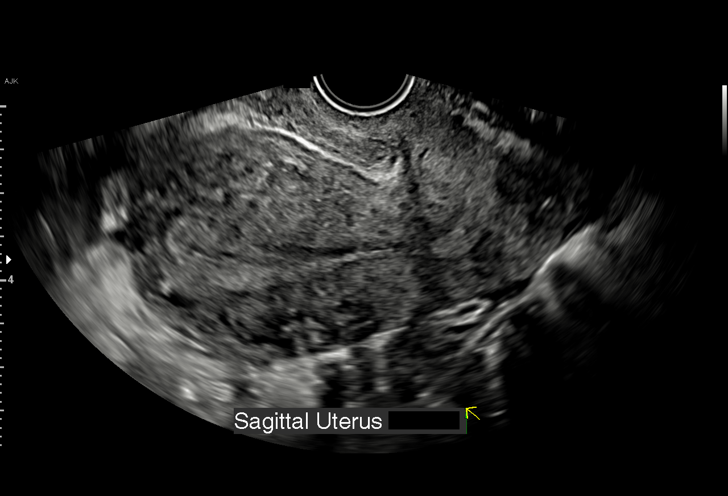
[im 36/70]
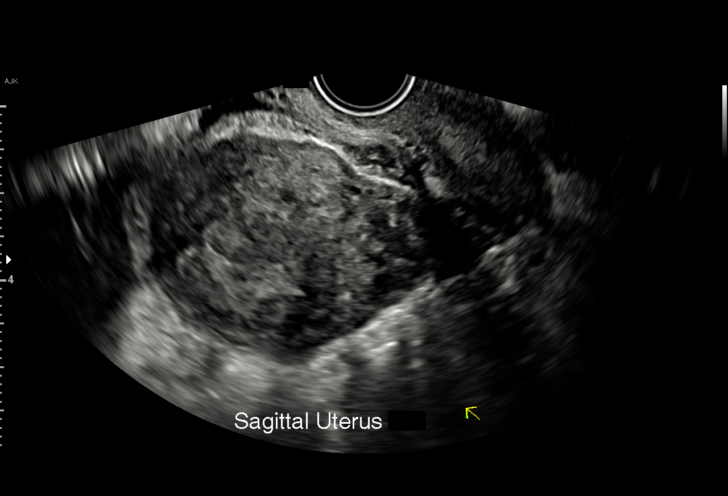
[im 39/70]
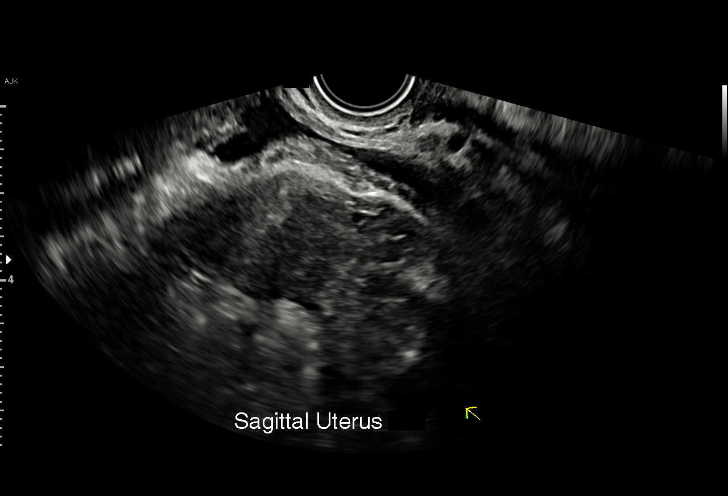
[im 44/70]
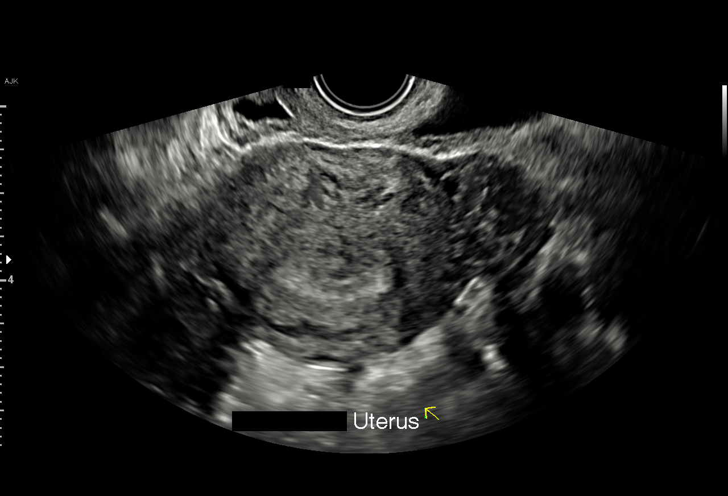
[im 49/70]
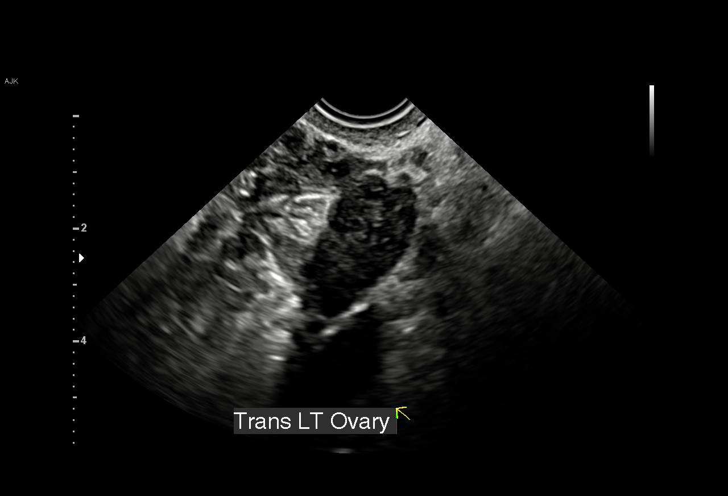
[im 54/70]
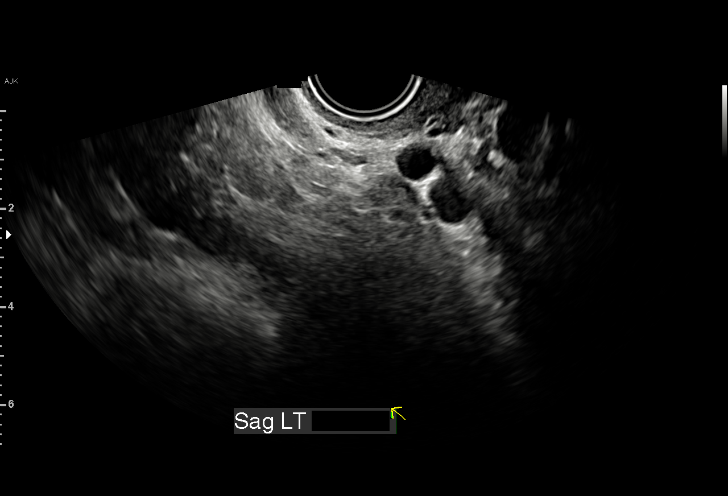
[im 59/70]
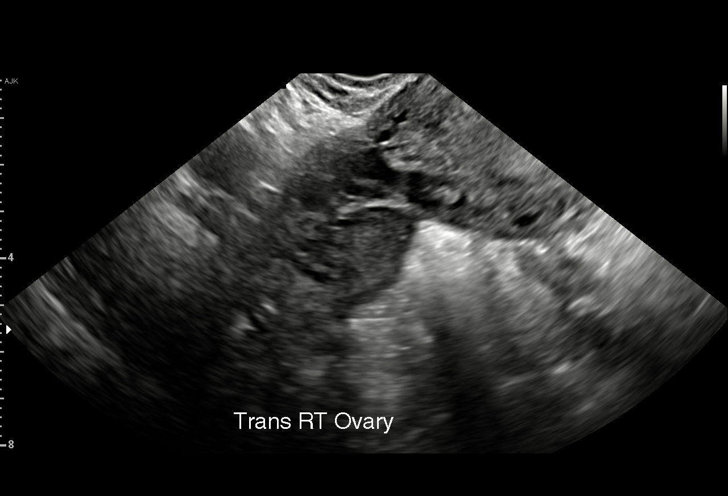
[im 64/70]
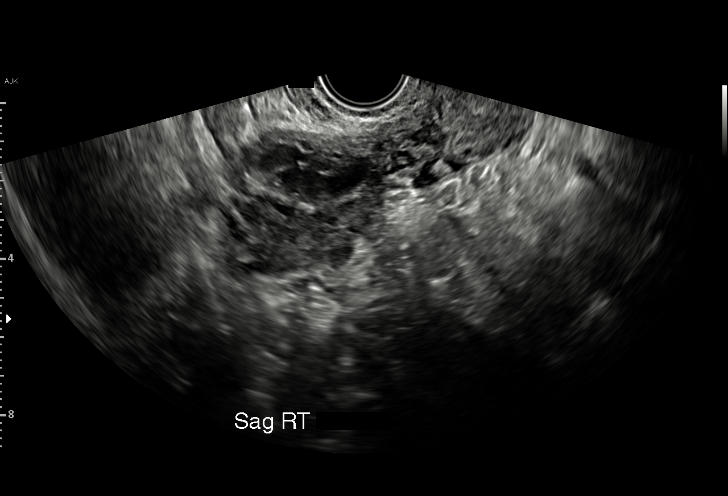
[im 70/70]
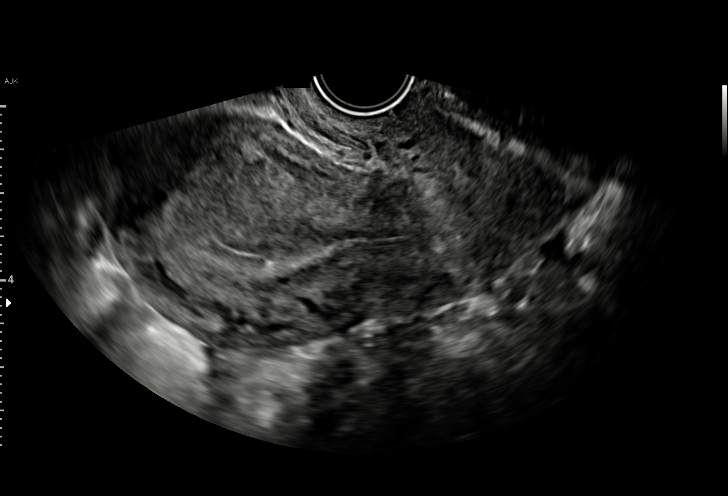

[15 of 28 positions shown; findings below may reference images not displayed]

FINDINGS: Intrauterine gestational sac: Negative.

Yolk sac:  Negative.

Embryo:  Negative.

Cardiac Activity: Negative.

Subchorionic hemorrhage:  None visualized.

Maternal uterus/adnexae: Ovaries within normal limits. No adnexal
mass. Trace free fluid within the pelvis.
IMPRESSION: 1. Early pregnancy with no discrete IUP or adnexal mass identified.
Finding is consistent with a pregnancy of unknown anatomic location.
Differential considerations include IUP to early to visualize,
recent SAB, or possibly occult ectopic pregnancy. Close clinical
monitoring with serial beta HCGs and close interval follow-up
ultrasound recommended as clinically warranted.
2. No other acute maternal uterine or adnexal abnormality.

## 2022-01-28 IMAGING — US US OB COMP LESS 14 WK
1 series · 15 of 28 positions shown · non-contrast
Comparison: Ultrasound 05/28/2020.

CLINICAL DATA: Pregnancy, unknown location.

EXAM:
OBSTETRIC <14 WK ULTRASOUND
TECHNIQUE: Transabdominal ultrasound was performed for evaluation of the
gestation as well as the maternal uterus and adnexal regions.

[Series 1: us ob comp less 14 wk · 15 of 37 slices shown]
[im 1/37]
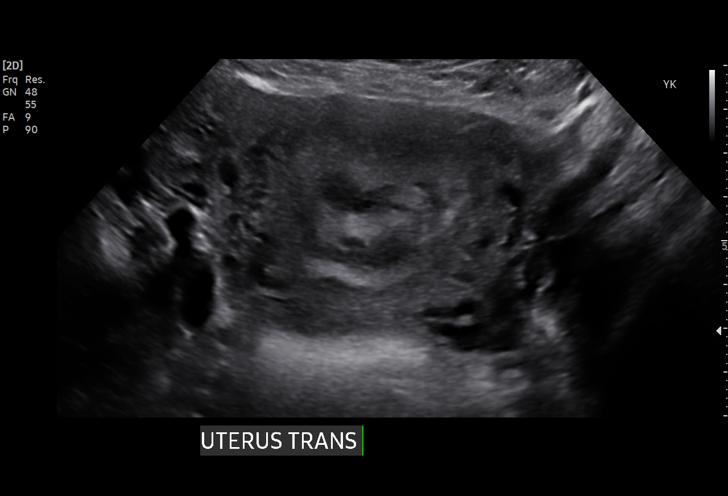
[im 3/37]
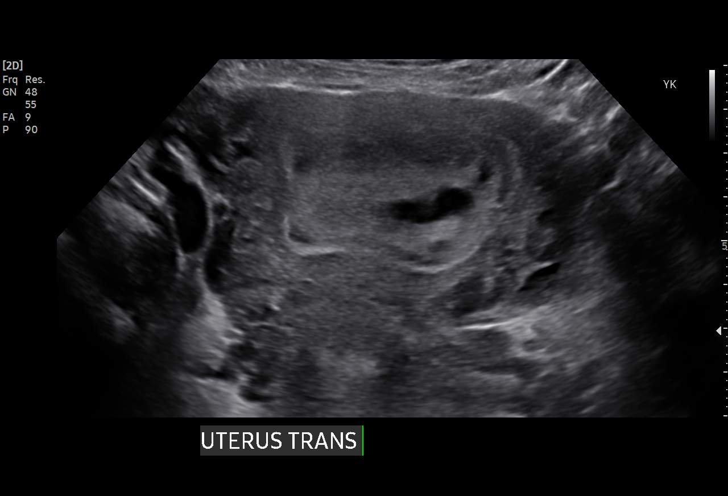
[im 6/37]
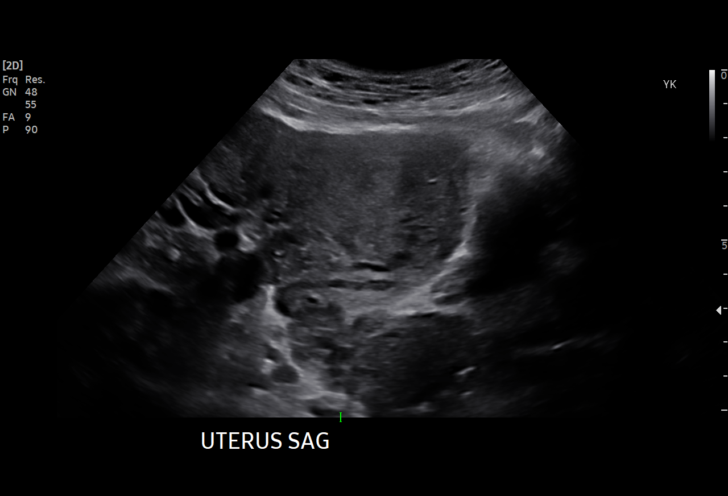
[im 9/37]
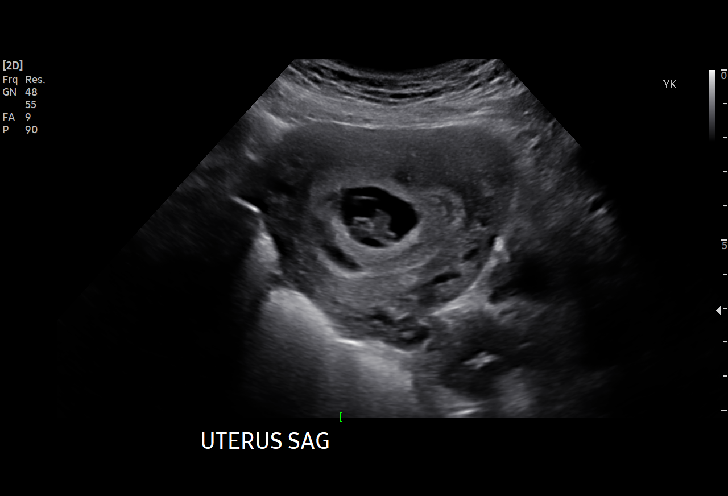
[im 11/37]
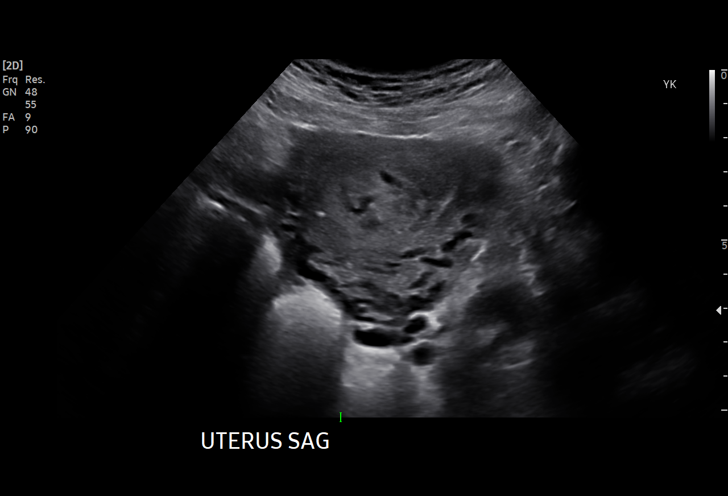
[im 14/37]
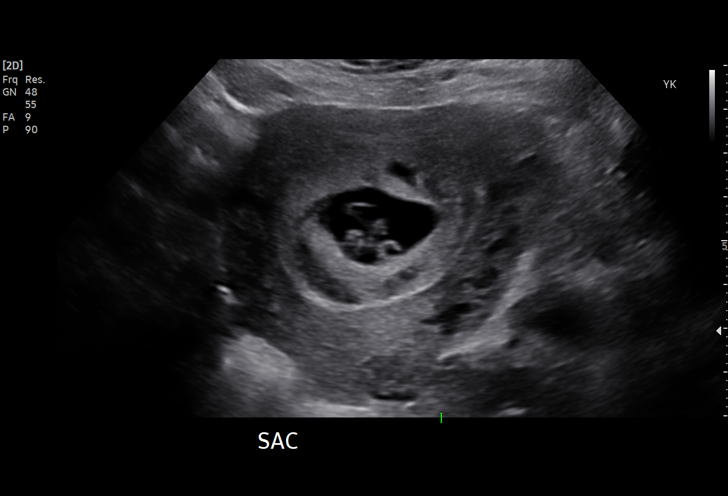
[im 17/37]
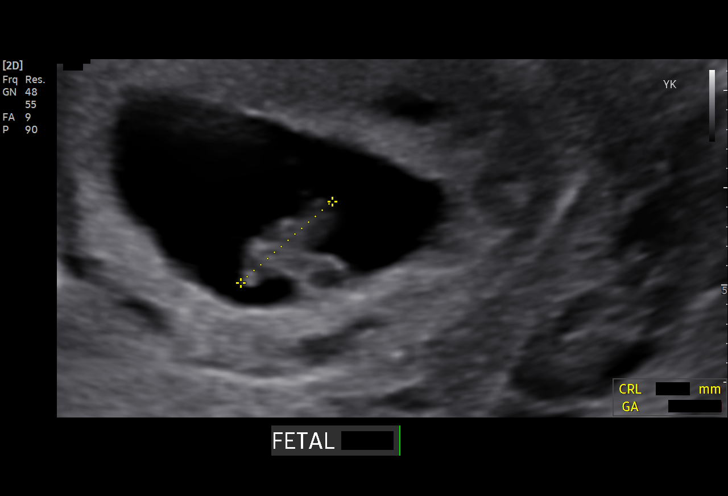
[im 19/37]
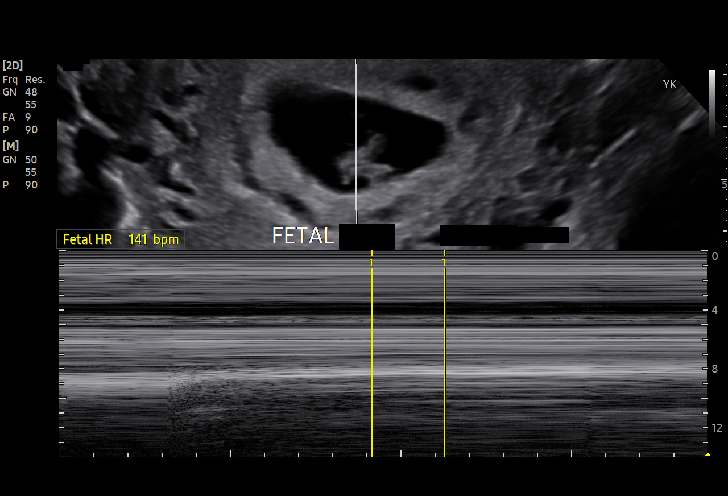
[im 21/37]
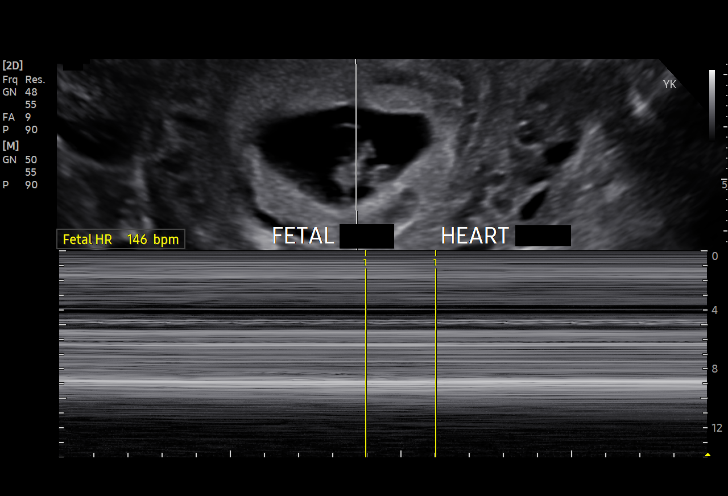
[im 23/37]
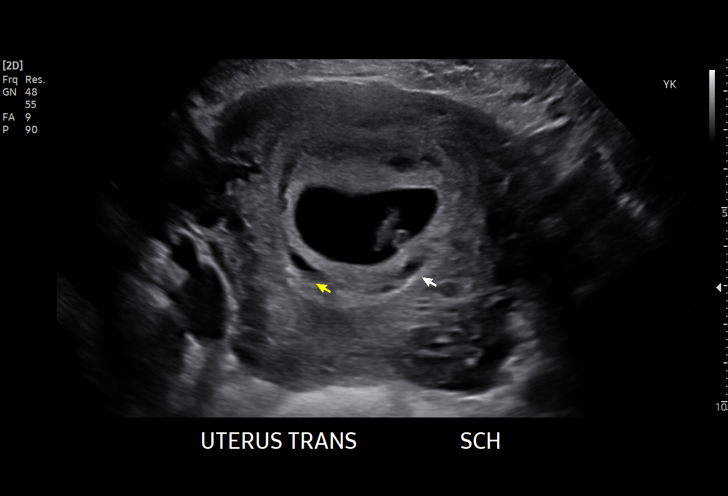
[im 26/37]
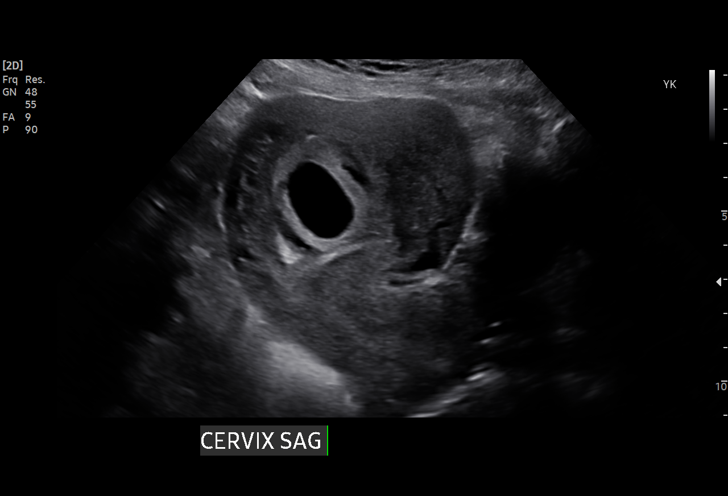
[im 29/37]
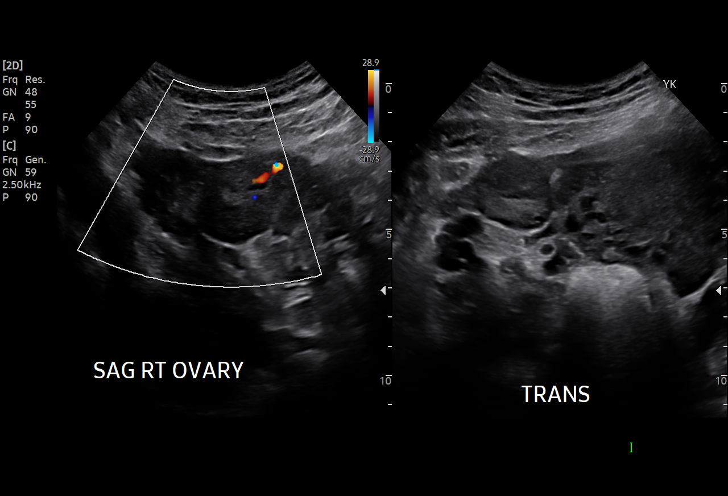
[im 31/37]
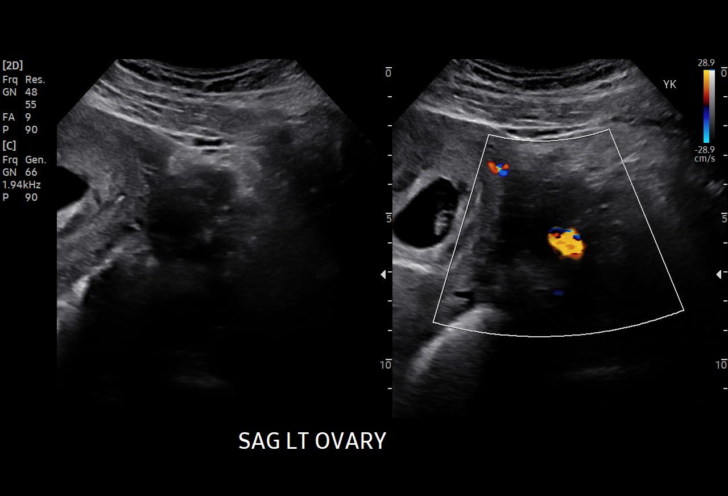
[im 34/37]
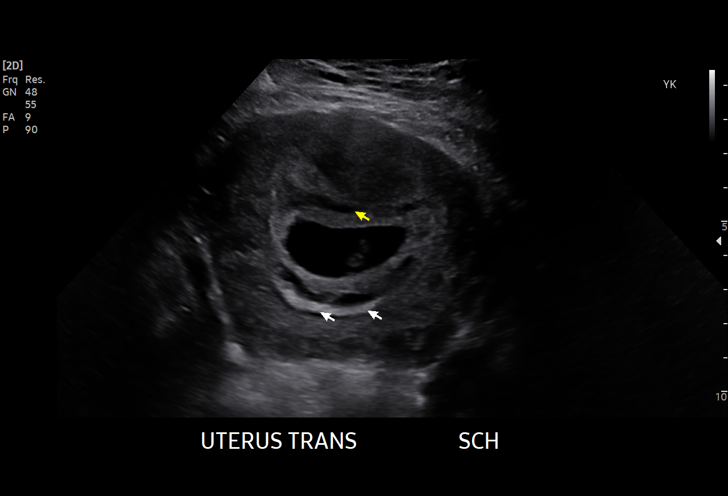
[im 37/37]
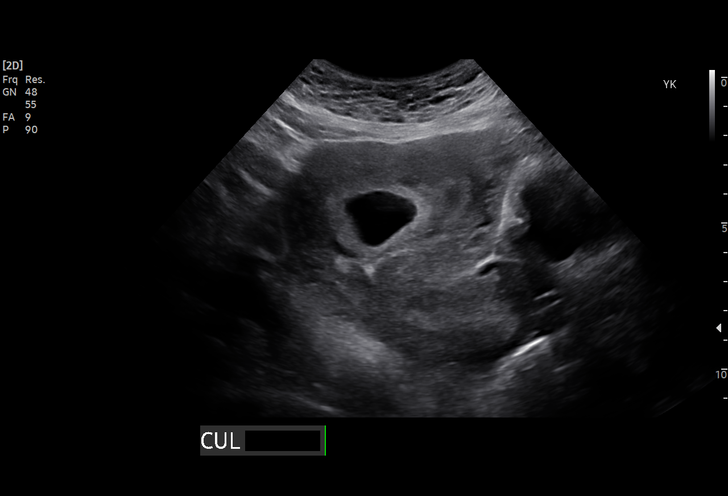

[15 of 28 positions shown; findings below may reference images not displayed]

FINDINGS: Intrauterine gestational sac: Single

Yolk sac:  Present

Embryo:  Present

Cardiac Activity: Present

Heart Rate: 147 bpm

CRL: 12.5 mm   7 w 3 d                  US EDC: 01/26/2021

Subchorionic hemorrhage:  Small subchorionic hemorrhage.

Maternal uterus/adnexae: Ovaries are unremarkable.  No free fluid.
IMPRESSION: Single viable intrauterine pregnancy at 7 weeks 3 days. Associated
small subchorionic hemorrhage.

## 2022-05-10 ENCOUNTER — Emergency Department (HOSPITAL_BASED_OUTPATIENT_CLINIC_OR_DEPARTMENT_OTHER): Payer: Medicaid Other | Admitting: Radiology

## 2022-05-10 ENCOUNTER — Encounter (HOSPITAL_BASED_OUTPATIENT_CLINIC_OR_DEPARTMENT_OTHER): Payer: Self-pay

## 2022-05-10 ENCOUNTER — Emergency Department (HOSPITAL_BASED_OUTPATIENT_CLINIC_OR_DEPARTMENT_OTHER)
Admission: EM | Admit: 2022-05-10 | Discharge: 2022-05-10 | Disposition: A | Discharge status: Home / Self Care | Payer: Medicaid Other | Attending: Emergency Medicine | Admitting: Emergency Medicine

## 2022-05-10 ENCOUNTER — Other Ambulatory Visit: Payer: Self-pay

## 2022-05-10 ENCOUNTER — Other Ambulatory Visit (HOSPITAL_BASED_OUTPATIENT_CLINIC_OR_DEPARTMENT_OTHER): Payer: Self-pay

## 2022-05-10 DIAGNOSIS — R079 Chest pain, unspecified: Secondary | ICD-10-CM

## 2022-05-10 LAB — CBC
HCT: 36.7 % (ref 36.0–46.0)
Hemoglobin: 12.1 g/dL (ref 12.0–15.0)
MCH: 26.9 pg (ref 26.0–34.0)
MCHC: 33 g/dL (ref 30.0–36.0)
MCV: 81.6 fL (ref 80.0–100.0)
Platelets: 334 10*3/uL (ref 150–400)
RBC: 4.5 MIL/uL (ref 3.87–5.11)
RDW: 14 % (ref 11.5–15.5)
WBC: 16.7 10*3/uL — ABNORMAL HIGH (ref 4.0–10.5)
nRBC: 0 % (ref 0.0–0.2)

## 2022-05-10 LAB — BASIC METABOLIC PANEL
Anion gap: 8 (ref 5–15)
BUN: 10 mg/dL (ref 6–20)
CO2: 27 mmol/L (ref 22–32)
Calcium: 9.1 mg/dL (ref 8.9–10.3)
Chloride: 105 mmol/L (ref 98–111)
Creatinine, Ser: 0.62 mg/dL (ref 0.44–1.00)
GFR, Estimated: >60 mL/min (ref >60)
Glucose, Bld: 94 mg/dL (ref 70–99)
Potassium: 3.5 mmol/L (ref 3.5–5.1)
Sodium: 140 mmol/L (ref 135–145)

## 2022-05-10 LAB — PREGNANCY, URINE: Preg Test, Ur: NEGATIVE

## 2022-05-10 LAB — D-DIMER, QUANTITATIVE: D-Dimer, Quant: <0.27 ug/mL-FEU (ref 0.00–0.50)

## 2022-05-10 LAB — TROPONIN I (HIGH SENSITIVITY)
Troponin I (High Sensitivity): <2 ng/L (ref <18)
Troponin I (High Sensitivity): <2 ng/L (ref <18)

## 2022-05-10 MED ORDER — LIDOCAINE VISCOUS HCL 2 % MT SOLN
15.0000 mL | Freq: Once | OROMUCOSAL | Status: AC
  Administered 2022-05-10: 15 mL via ORAL
  Filled 2022-05-10: qty 15

## 2022-05-10 MED ORDER — FAMOTIDINE 20 MG PO TABS
20.0000 mg | ORAL_TABLET | Freq: Two times a day (BID) | ORAL | 0 refills | Status: DC
  Filled 2022-05-10: qty 30, 15d supply, fill #0

## 2022-05-10 MED ORDER — OMEPRAZOLE 20 MG PO CPDR
20.0000 mg | DELAYED_RELEASE_CAPSULE | Freq: Every day | ORAL | 0 refills | Status: DC
  Filled 2022-05-10: qty 30, 30d supply, fill #0

## 2022-05-10 MED ORDER — IBUPROFEN 800 MG PO TABS
800.0000 mg | ORAL_TABLET | Freq: Once | ORAL | Status: AC
  Administered 2022-05-10: 800 mg via ORAL
  Filled 2022-05-10: qty 1

## 2022-05-10 MED ORDER — ALUM & MAG HYDROXIDE-SIMETH 200-200-20 MG/5ML PO SUSP
30.0000 mL | Freq: Once | ORAL | Status: AC
  Administered 2022-05-10: 30 mL via ORAL
  Filled 2022-05-10: qty 30

## 2022-05-10 MED ORDER — FAMOTIDINE 20 MG PO TABS
20.0000 mg | ORAL_TABLET | Freq: Once | ORAL | Status: AC
  Administered 2022-05-10: 20 mg via ORAL
  Filled 2022-05-10: qty 1

## 2022-05-10 MED ORDER — ACETAMINOPHEN 325 MG PO TABS
650.0000 mg | ORAL_TABLET | Freq: Four times a day (QID) | ORAL | 0 refills | Status: DC | PRN
  Filled 2022-05-10: qty 100, 13d supply, fill #0

## 2022-05-10 NOTE — ED Provider Notes (Signed)
[place] EMERGENCY DEPARTMENT AT [facility] Provider Note   CSN: [id number] Arrival date & time: 05/10/22  0827     History  Chief Complaint  Patient presents with   Chest Pain   Shortness of Breath   Arm Pain    Jean Solis is a 27 y.o. female history of anxiety presents today for evaluation of chest pain.  Patient reported she started to have chest pain this morning.  States she felt the pain in the center of her chest every time she breathes.  States her boyfriend said that she was "breathing differently" last night.  No history of CAD, asthma, COPD.  No recent travel or surgery, cough, history of PE/DVT, oral contraceptive, cancer, leg swelling.  Patient also report weakness on both hands.  States she was trying to do her hair and cannot hold them up for 10 minutes.  Denies any fever, nausea, vomiting, bowel change, urinary symptoms, rash.   Chest Pain Associated symptoms: shortness of breath   Shortness of Breath Associated symptoms: chest pain   Arm Pain Associated symptoms include chest pain and shortness of breath.      Past Medical History:  Diagnosis Date   Anxiety    Chlamydia 09/2011   Depression    on meds, emotions up and down   Headache    STD (sexually transmitted disease)    Umbilical hernia    dx with prev preg   Vaginal Pap smear, abnormal    +in last preg, had colpo- they were talking about freezing the cells   Past Surgical History:  Procedure Laterality Date   BARTHOLIN CYST MARSUPIALIZATION Right 2012   TONSILLECTOMY       Home Medications Prior to Admission medications   Medication Sig Start Date End Date Taking? Authorizing Provider  ibuprofen (ADVIL) 800 MG tablet Take 800 mg by mouth every 8 (eight) hours as needed. 09/08/21   [provider]  sertraline (ZOLOFT) 100 MG tablet Take half a tablet a day for one week, if tolerating, then increase to one tablet a day 10/02/21   Romualdo Bolk, MD  valACYclovir  (VALTREX) 500 MG tablet Take one tablet po qd, increase to one tablet po BID x 3 days as needed. 10/02/21   Romualdo Bolk, MD  Vitamin D, Ergocalciferol, (DRISDOL) 1.25 MG (50000 UNIT) CAPS capsule Take 1 capsule (50,000 Units total) by mouth every 7 (seven) days. 10/07/21   Romualdo Bolk, MD  [facility] [employer] 150-35 MCG/24HR transdermal patch 1 patch once a week. 11/02/21   [provider]      Allergies    Patient has no known allergies.    Review of Systems   Review of Systems  Respiratory:  Positive for shortness of breath.   Cardiovascular:  Positive for chest pain.    Physical Exam Updated Vital Signs BP 125/72   Pulse 99   Temp 98.3 F (36.8 C)   Resp 17   Ht 5\' 5"  (1.651 m)   Wt 106.6 kg   SpO2 99%   BMI 39.11 kg/m  Physical Exam Vitals and nursing note reviewed.  Constitutional:      Appearance: Normal appearance.  HENT:     Head: Normocephalic and atraumatic.     Mouth/Throat:     Mouth: Mucous membranes are moist.  Eyes:     General: No scleral icterus. Cardiovascular:     Rate and Rhythm: Normal rate and regular rhythm.     Pulses: Normal pulses.  Heart sounds: Normal heart sounds.  Pulmonary:     Effort: Pulmonary effort is normal.     Breath sounds: Normal breath sounds.  Abdominal:     General: Abdomen is flat.     Palpations: Abdomen is soft.     Tenderness: There is no abdominal tenderness.  Musculoskeletal:        General: No deformity.  Skin:    General: Skin is warm.     Findings: No rash.  Neurological:     General: No focal deficit present.     Mental Status: She is alert.  Psychiatric:        Mood and Affect: Mood normal.     ED Results / Procedures / Treatments   Labs (all labs ordered are listed, but only abnormal results are displayed) Labs Reviewed  BASIC METABOLIC PANEL  CBC  PREGNANCY, URINE  D-DIMER, QUANTITATIVE  TROPONIN I (HIGH SENSITIVITY)    EKG EKG Interpretation  Date/Time:  Monday May 10 2022 08:40:19 EDT Ventricular Rate:  103 PR Interval:  128 QRS Duration: 74 QT Interval:  332 QTC Calculation: 434 R Axis:   56 Text Interpretation: Sinus tachycardia Nonspecific T wave abnormality Abnormal ECG No previous ECGs available Confirmed by Ernie Avena ([id number]) on 05/10/2022 8:48:32 AM  Radiology No results found.  Procedures Procedures    Medications Ordered in ED Medications  ibuprofen (ADVIL) tablet 800 mg (has no administration in time range)    ED Course/ Medical Decision Making/ A&P                             Medical Decision Making Amount and/or Complexity of Data Reviewed Labs: ordered. Radiology: ordered.  Risk OTC drugs. Prescription drug management.   This patient presents to the ED for chest pain, shortness of breath, this involves an extensive number of treatment options, and is a complaint that carries with a high risk of complications and morbidity.  The differential diagnosis includes ACS, pericarditis, PE, pneumothorax, pneumonia, less likely dissection with essentially normal blood pressure, symmetric bilateral pulses, and no back pain.  This is not an exhaustive list.  Lab tests: I ordered and personally interpreted labs.  The pertinent results include: WBC 16.7. Hbg unremarkable. Platelets unremarkable. Electrolytes unremarkable. BUN, creatinine unremarkable.  D-dimer negative.  Delta troponin negative.  Imaging studies: I ordered imaging studies. I personally reviewed, interpreted imaging and agree with the radiologist's interpretations. The results include: Chest x-ray unremarkable.  Problem list/ ED course/ Critical interventions/ Medical management: HPI: See above Vital signs within normal range and stable throughout visit. Laboratory/imaging studies significant for: See above. On physical examination, patient is afebrile and appears in no acute distress. Exam without evidence of volume overload so doubt heart failure. EKG without  signs of active ischemia. Given the timing of pain to ER presentation, delta troponin was negative so doubt NSTEMI. Presentation not consistent with acute PE (Wells low risk, negative D-dimer), pneumothorax (not visualized on chest xr), thoracic aortic dissection, pericarditis, tamponade, pneumonia (no infectious symptoms, clear chest xr), myocarditis (no recent illness, neg trop). HEART score: 0 so plan to discharge patient home with PCP follow up.  I have reviewed the patient home medicines and have made adjustments as needed.  Cardiac monitoring/EKG: The patient was maintained on a cardiac monitor.  I personally reviewed and interpreted the cardiac monitor which showed an underlying rhythm of: sinus rhythm.  Additional history obtained: External records from outside source obtained  and reviewed including: Chart review including previous notes, labs, imaging.  Consultations obtained:  Disposition Continued outpatient therapy. Follow-up with PCP recommended for reevaluation of symptoms. Treatment plan discussed with patient.  Pt acknowledged understanding was agreeable to the plan. Worrisome signs and symptoms were discussed with patient, and patient acknowledged understanding to return to the ED if they noticed these signs and symptoms. Patient was stable upon discharge.   This chart was dictated using voice recognition software.  Despite best efforts to proofread,  errors can occur which can change the documentation meaning.          Final Clinical Impression(s) / ED Diagnoses Final diagnoses:  Chest pain, unspecified type    Rx / DC Orders ED Discharge Orders          Ordered    acetaminophen (TYLENOL) 325 MG tablet  Every 6 hours PRN        05/10/22 1235    famotidine (PEPCID) 20 MG tablet  2 times daily        05/10/22 1235    omeprazole (PRILOSEC) 20 MG capsule  Daily        05/10/22 1235              Jeanelle Malling, [place] 05/10/22 1239    Ernie Avena, MD 05/10/22  1739

## 2022-05-10 NOTE — Discharge Instructions (Addendum)
Please take your medications as prescribed. Take tylenol/ibuprofen for pain. I recommend close follow-up with PCP for reevaluation.  Please do not hesitate to return to emergency department if worrisome signs symptoms we discussed become apparent.  

## 2022-05-10 NOTE — ED Triage Notes (Signed)
Pt reports chest pain when she takes a deep breath, sob, and bilateral arm pain since last night. Pt AxOx4. NAD.

## 2022-05-12 ENCOUNTER — Ambulatory Visit (INDEPENDENT_AMBULATORY_CARE_PROVIDER_SITE_OTHER): Payer: Medicaid Other | Admitting: Obstetrics and Gynecology

## 2022-05-12 ENCOUNTER — Encounter: Payer: Self-pay | Admitting: Obstetrics and Gynecology

## 2022-05-12 ENCOUNTER — Other Ambulatory Visit (HOSPITAL_COMMUNITY)
Admission: RE | Admit: 2022-05-12 | Discharge: 2022-05-12 | Disposition: A | Discharge status: Home / Self Care | Payer: Medicaid Other | Source: Ambulatory Visit | Attending: Obstetrics and Gynecology | Admitting: Obstetrics and Gynecology

## 2022-05-12 VITALS — BP 110/70 | HR 84 | Wt 240.0 lb

## 2022-05-12 DIAGNOSIS — K219 Gastro-esophageal reflux disease without esophagitis: Secondary | ICD-10-CM | POA: Diagnosis not present

## 2022-05-12 DIAGNOSIS — Z87410 Personal history of cervical dysplasia: Secondary | ICD-10-CM

## 2022-05-12 DIAGNOSIS — N760 Acute vaginitis: Secondary | ICD-10-CM | POA: Diagnosis not present

## 2022-05-12 DIAGNOSIS — J309 Allergic rhinitis, unspecified: Secondary | ICD-10-CM | POA: Insufficient documentation

## 2022-05-12 DIAGNOSIS — Z01812 Encounter for preprocedural laboratory examination: Secondary | ICD-10-CM

## 2022-05-12 DIAGNOSIS — B9689 Other specified bacterial agents as the cause of diseases classified elsewhere: Secondary | ICD-10-CM

## 2022-05-12 LAB — PREGNANCY, URINE: Preg Test, Ur: NEGATIVE

## 2022-05-12 LAB — WET PREP FOR TRICH, YEAST, CLUE

## 2022-05-12 MED ORDER — OMEPRAZOLE 20 MG PO CPDR: 20.0000 mg | DELAYED_RELEASE_CAPSULE | Freq: Every day | ORAL | 0 refills | Status: DC

## 2022-05-12 MED ORDER — METRONIDAZOLE 500 MG PO TABS
500.0000 mg | ORAL_TABLET | Freq: Two times a day (BID) | ORAL | 0 refills | Status: DC
Start: 2022-05-12 — End: 2022-06-08

## 2022-05-12 NOTE — Patient Instructions (Signed)

## 2022-05-12 NOTE — Progress Notes (Signed)
GYNECOLOGY  VISIT   HPI: 27 y.o.   Single Black or African American Not Hispanic or Latino  female   [phone] with No LMP recorded.   here for a colposcopy.  She had a colposcopy and biopsy at another GYN in 4/23 (pap ASCUS, +HPV in 7/22). 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 Cryo was recommended. At the time of her visit here in 9/23 a repeat pap was done that returned with LSIL, +HPV. A colpo was done on 11/06/22. 3 cervical biopsies were done that all returned with CIN, ECC was negative.   She was offered leep or pap and colpo q 6 months for a total of 2 years. She is here for a f/u pap and colpo.  She reports a 2 week h/o an increase in vaginal d/c with an odor.   She was seen in the ER earlier this week with chest pain and was diagnosed with GERD, started on prilosec. She is requesting a refill, she doesn't have a primary but will establish care.   GYNECOLOGIC HISTORY: No LMP recorded. Contraception:nexplanon Menopausal hormone therapy: none        OB History     Gravida  5   Para  3   Term  3   Preterm  0   AB  1   Living  3      SAB  0   IAB  0   Ectopic  0   Multiple  0   Live Births  3              Patient Active Problem List   Diagnosis Date Noted   ASCUS with positive high risk HPV cervical 07/29/2020   Anxiety 04/21/2020   Alpha thalassemia silent carrier 09/27/2019   Genital herpes 03/01/2018   IUD (intrauterine device) in place 12/07/2017   Pre-diabetes 07/20/2017   Eczema 12/27/2011    Past Medical History:  Diagnosis Date   Anxiety    Chlamydia 09/2011   Depression    on meds, emotions up and down   Headache    STD (sexually transmitted disease)    Umbilical hernia    dx with prev preg   Vaginal Pap smear, abnormal    +in last preg, had colpo- they were talking about freezing the cells    Past Surgical History:  Procedure Laterality Date   BARTHOLIN CYST MARSUPIALIZATION Right 2012   TONSILLECTOMY      Current Outpatient  Medications  Medication Sig Dispense Refill   acetaminophen (TYLENOL) 325 MG tablet Take 2 tablets (650 mg total) by mouth every 6 (six) hours as needed. 100 tablet 0   famotidine (PEPCID) 20 MG tablet Take 1 tablet (20 mg total) by mouth 2 (two) times daily. 30 tablet 0   ibuprofen (ADVIL) 800 MG tablet Take 800 mg by mouth every 8 (eight) hours as needed.     omeprazole (PRILOSEC) 20 MG capsule Take 1 capsule (20 mg total) by mouth daily. 30 capsule 0   sertraline (ZOLOFT) 100 MG tablet Take half a tablet a day for one week, if tolerating, then increase to one tablet a day 90 tablet 3   valACYclovir (VALTREX) 500 MG tablet Take one tablet po qd, increase to one tablet po BID x 3 days as needed. 90 tablet 4   Vitamin D, Ergocalciferol, (DRISDOL) 1.25 MG (50000 UNIT) CAPS capsule Take 1 capsule (50,000 Units total) by mouth every 7 (seven) days. 12 capsule 0   XULANE  150-35 MCG/24HR transdermal patch 1 patch once a week.     No current facility-administered medications for this visit.     ALLERGIES: Patient has no known allergies.  Family History  Problem Relation Age of Onset   Hypertension Mother    Hyperlipidemia Mother    Thyroid disease Mother    Diabetes Maternal Uncle    Hypertension Maternal Grandmother     Social History   Socioeconomic History   Marital status: Single    Spouse name: Not on file   Number of children: 1   Years of education: 13   Highest education level: Not on file  Occupational History   Not on file  Tobacco Use   Smoking status: Never   Smokeless tobacco: Never  Vaping Use   Vaping Use: Never used  Substance and Sexual Activity   Alcohol use: Yes    Alcohol/week: 1.0 standard drink of alcohol    Types: 1 Standard drinks or equivalent per week    Comment: occasionally.   Drug use: No   Sexual activity: Yes    Birth control/protection: Patch    Comment: HSV  Other Topics Concern   Not on file  Social History Narrative   Not on file    Social Determinants of Health   Financial Resource Strain: Not on file  Food Insecurity: Not on file  Transportation Needs: No Transportation Needs (10/10/2017)   PRAPARE - Administrator, Civil Service (Medical): No    Lack of Transportation (Non-Medical): No  Physical Activity: Inactive (10/10/2017)   Exercise Vital Sign    Days of Exercise per Week: 0 days    Minutes of Exercise per Session: 0 min  Stress: No Stress Concern Present (10/10/2017)   [employer] of Occupational Health - Occupational Stress Questionnaire    Feeling of Stress : Only a little  Social Connections: Not on file  Intimate Partner Violence: Not on file    ROS  PHYSICAL EXAMINATION:    There were no vitals taken for this visit.    General appearance: alert, cooperative and appears stated age  Pelvic: External genitalia:  no lesions              Urethra:  normal appearing urethra with no masses, tenderness or lesions              Bartholins and Skenes: normal                 Vagina: normal appearing vagina with an increase in frothy, white vaginal discharge, no lesions              Cervix: no lesions  Colposcopy: satisfactory, minimal aceto-white changes at 1 and 5 o'clock, biopsies done, ECC done. Hemostasis obtained with silver nitrate and monsels. Negative lugols examination of the vagina.               Chaperone was present for exam.  1. 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 Pre-procedure lab exam - Pregnancy, urine  3. Gastroesophageal reflux disease, unspecified whether esophagitis present She will establish care with a primary care provider, will refill prilosec until then - omeprazole (PRILOSEC) 20 MG capsule; Take 1 capsule (20 mg total) by mouth daily.  Dispense: 90 capsule; Refill: 0  4. Bacterial vaginitis - WET PREP FOR TRICH, YEAST, CLUE - metroNIDAZOLE (FLAGYL) 500 MG  tablet; Take 1 tablet (500  mg total) by mouth 2 (two) times daily.  Dispense: 14 tablet; Refill: 0

## 2022-05-14 LAB — CYTOLOGY - PAP
Comment: NEGATIVE
High risk HPV: POSITIVE — AB

## 2022-05-18 LAB — SURGICAL PATHOLOGY

## 2022-05-18 IMAGING — US US MFM OB FOLLOW-UP
1 series · 14 of 28 positions shown · non-contrast
Comparison: none

[Series 1: us mfm ob follow-up · 14 of 69 slices shown]
[im 3/69]
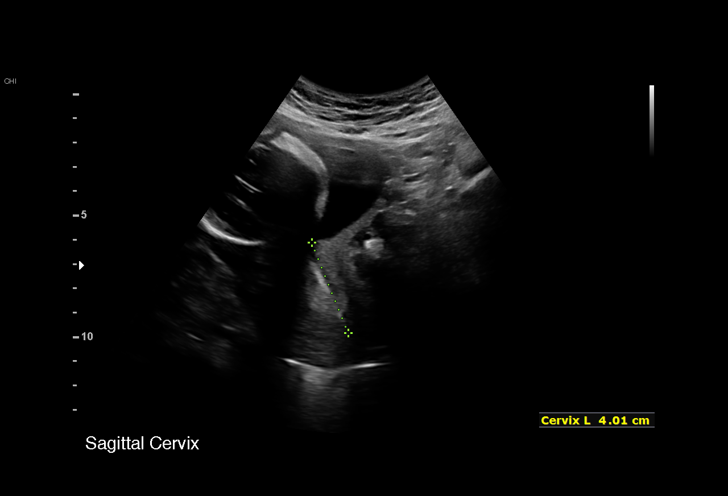
[im 8/69]
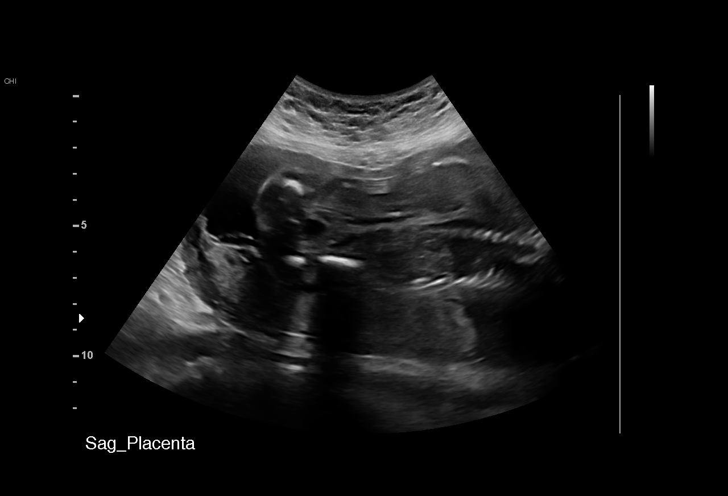
[im 13/69]
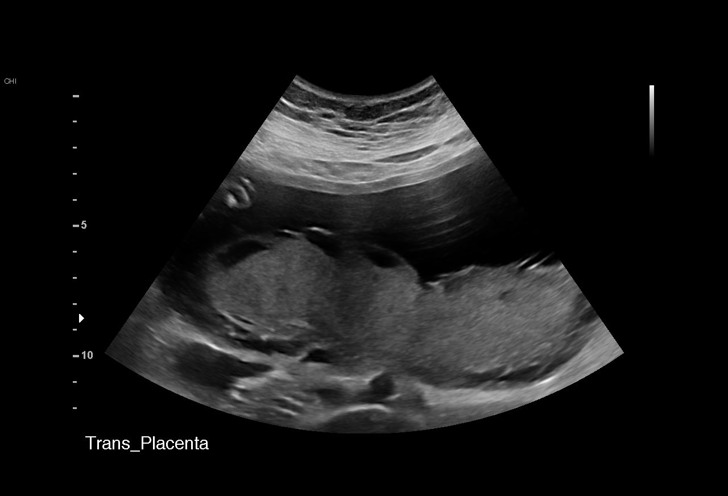
[im 18/69]
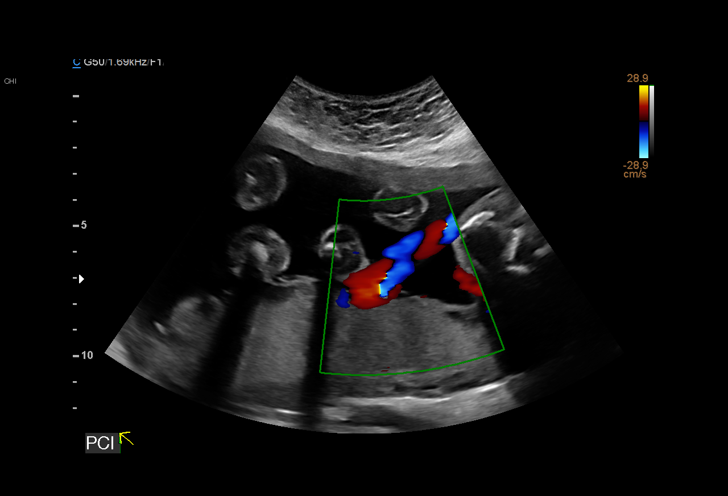
[im 23/69]
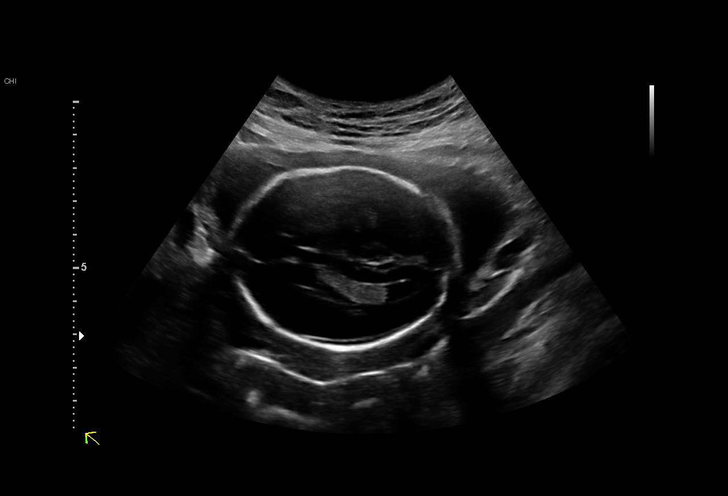
[im 28/69]
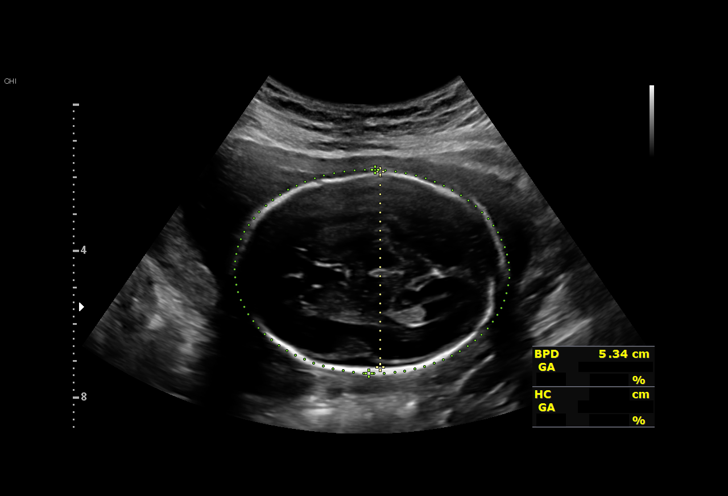
[im 33/69]
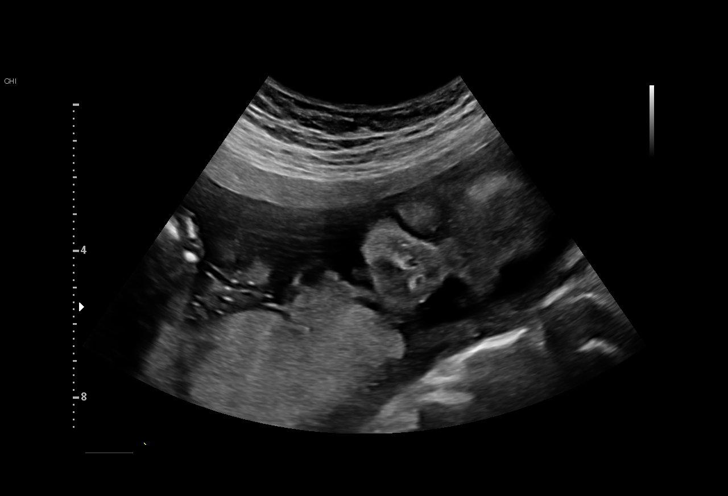
[im 38/69]
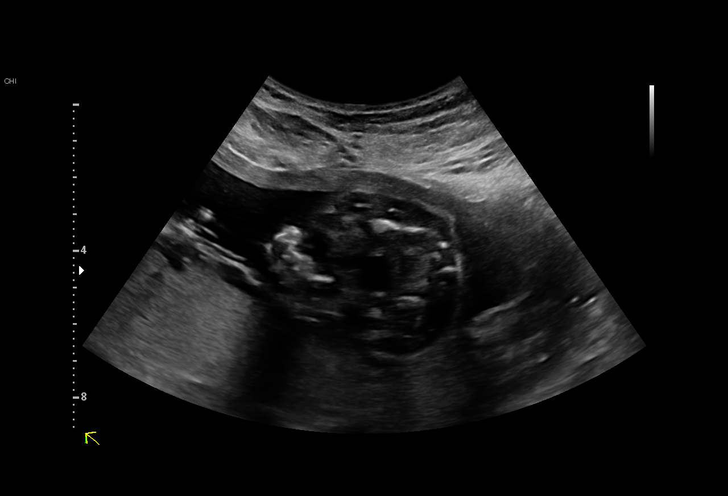
[im 43/69]
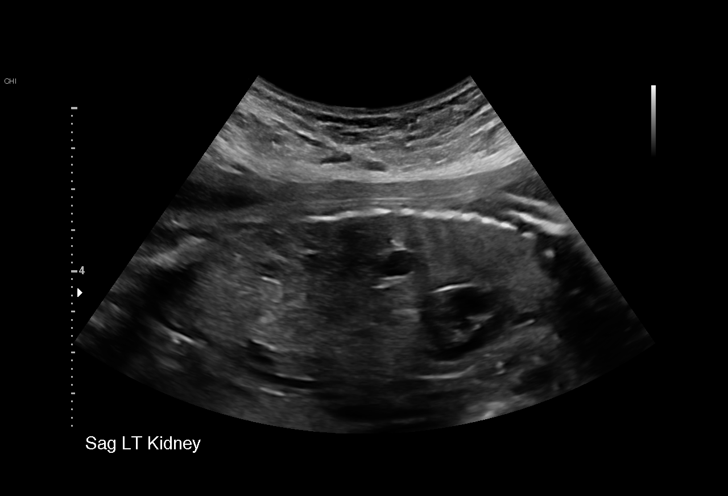
[im 48/69]
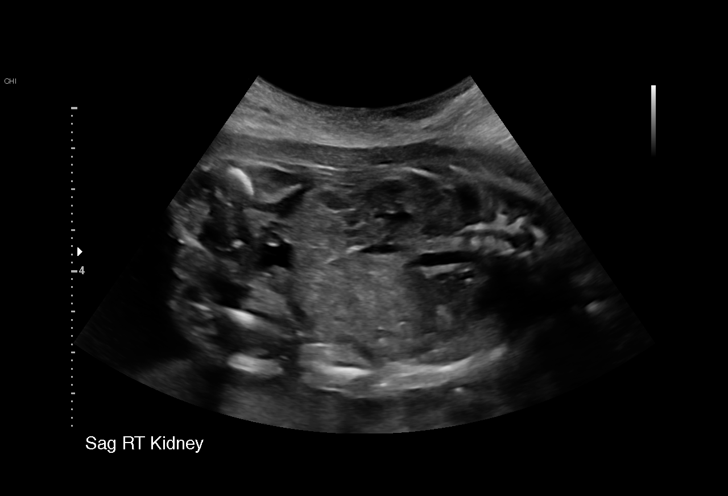
[im 53/69]
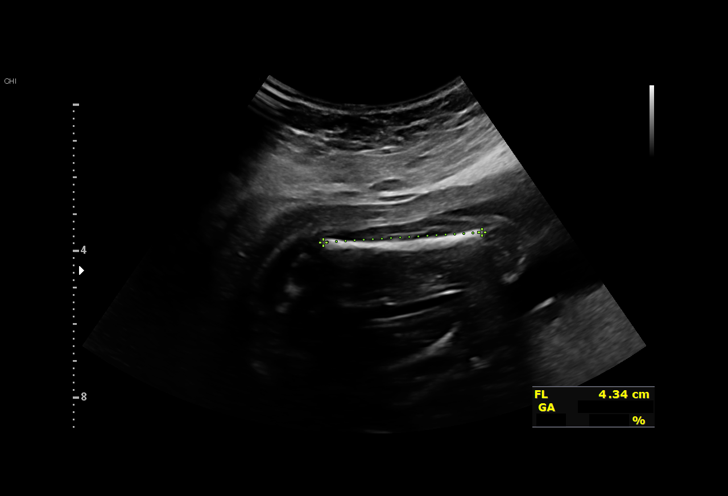
[im 58/69]
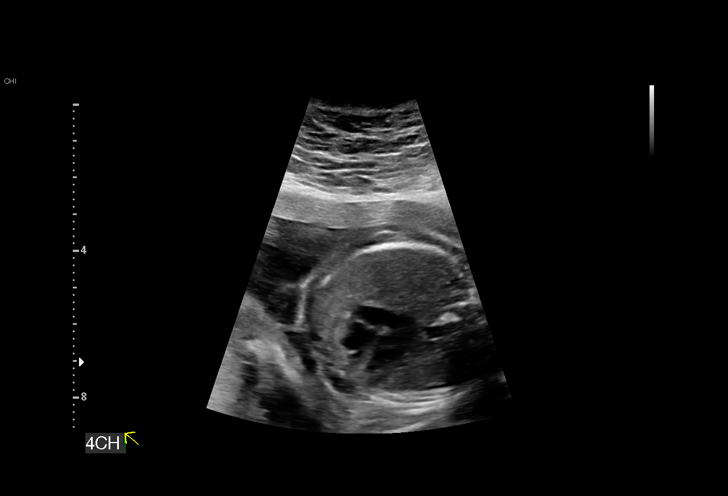
[im 63/69]
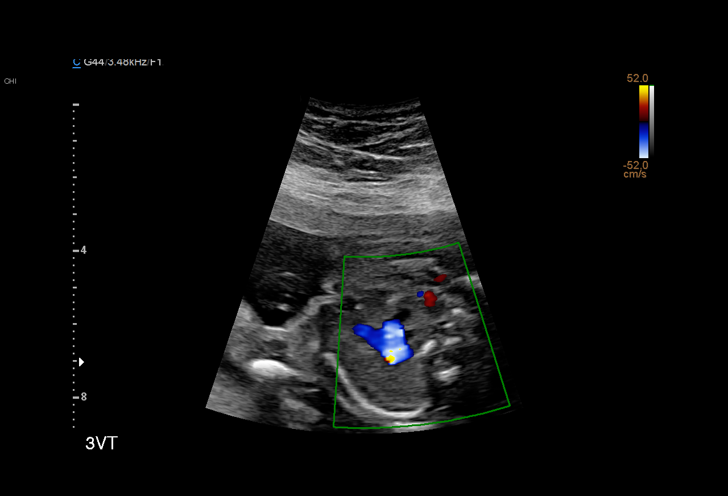
[im 69/69]
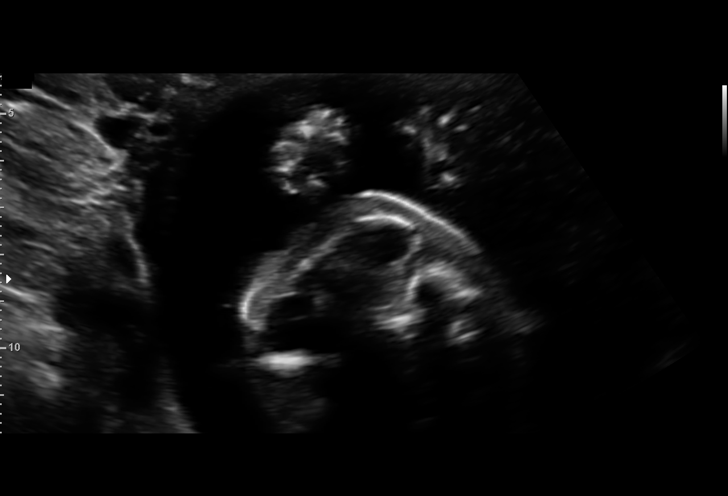

[14 of 28 positions shown; findings below may reference images not displayed]

Indications

 Genetic carrier (silent Bilalli Anicet)
 Low risk NIPS, neg AFP
 Obesity complicating pregnancy, second
 trimester (pregravid BMI 32)
 Antenatal follow-up for nonvisualized fetal
 anatomy
 23 weeks gestation of pregnancy
Fetal Evaluation

 Num Of Fetuses:         1
 Fetal Heart Rate(bpm):  158
 Cardiac Activity:       Observed
 Presentation:           Cephalic
 Placenta:               Posterior
 P. Cord Insertion:      Visualized, central

 Amniotic Fluid
 AFI FV:      Within normal limits

                             Largest Pocket(cm)

Biometry

 BPD:      53.7  mm     G. Age:  22w 2d         17  %    CI:        67.67   %    70 - 86
                                                         FL/HC:      20.5   %    19.2 -
 HC:      208.9  mm     G. Age:  23w 0d         28  %    HC/AC:      1.12        1.05 -
 AC:      187.1  mm     G. Age:  23w 3d         53  %    FL/BPD:     79.9   %    71 - 87
 FL:       42.9  mm     G. Age:  24w 0d         68  %    FL/AC:      22.9   %    20 - 24

 LV:        4.8  mm
 Est. FW:     609  gm      1 lb 5 oz     65  %
OB History

 Gravidity:    3         Term:   2        Prem:   0        SAB:   0
 TOP:          0       Ectopic:  0        Living: 2
Gestational Age

 U/S Today:     23w 1d                                        EDD:   01/26/21
 Best:          23w 1d     Det. By:  Early Ultrasound         EDD:   01/26/21
                                     (06/12/20)
Anatomy

 Cranium:               Appears normal         LVOT:                   Previously seen
 Cavum:                 Appears normal         Aortic Arch:            Previously seen
 Ventricles:            Appears normal         Ductal Arch:            Previously seen
 Choroid Plexus:        Previously seen        Diaphragm:              Appears normal
 Cerebellum:            Previously seen        Stomach:                Appears normal, left
                                                                       sided
 Posterior Fossa:       Previously seen        Abdomen:                Previously seen
 Nuchal Fold:           Previously seen        Abdominal Wall:         Previously seen
 Face:                  Orbits and profile     Cord Vessels:           Previously seen
                        previously seen
 Lips:                  Previously seen        Kidneys:                Appear normal
 Palate:                Not well visualized    Bladder:                Appears normal
 Thoracic:              Appears normal         Spine:                  Previously seen
 Heart:                 Appears normal         Upper Extremities:      Previously seen
                        (4CH, axis, and
                        situs)
 RVOT:                  Appears normal         Lower Extremities:      Previously seen

 Other:  Nasal bone and lenses prev. visualized. 3VV prev. visualized.
         Heels/feet and open hands/5th digits prev. visualized. VC and 3VTV
         visualized.
Cervix Uterus Adnexa

 Cervix
 Length:           4.01  cm.
 Normal appearance by transabdominal scan.
Impression

 Patient returned for completion of fetal anatomy .Amniotic
 fluid is normal and good fetal activity is seen .Fetal biometry
 is consistent with her previously-established dates .Fetal
 anatomical survey was completed and appears normal.
Recommendations

 Follow-up scans as clinically indicated.
                 Graciano, Kilo

## 2022-05-26 ENCOUNTER — Other Ambulatory Visit: Payer: Self-pay

## 2022-05-26 DIAGNOSIS — Z87410 Personal history of cervical dysplasia: Secondary | ICD-10-CM

## 2022-05-26 DIAGNOSIS — N871 Moderate cervical dysplasia: Secondary | ICD-10-CM

## 2022-06-08 ENCOUNTER — Encounter: Payer: Self-pay | Admitting: Obstetrics and Gynecology

## 2022-06-08 ENCOUNTER — Ambulatory Visit: Payer: Medicaid Other | Admitting: Obstetrics and Gynecology

## 2022-06-08 VITALS — BP 130/74 | HR 68 | Ht 65.0 in | Wt 236.0 lb

## 2022-06-08 DIAGNOSIS — N871 Moderate cervical dysplasia: Secondary | ICD-10-CM | POA: Diagnosis not present

## 2022-06-08 MED ORDER — LORAZEPAM 1 MG PO TABS: ORAL_TABLET | ORAL | 0 refills | Status: DC

## 2022-06-08 NOTE — Progress Notes (Signed)
GYNECOLOGY  VISIT   HPI: 27 y.o.   Single Black or African American Not Hispanic or Latino  female   [phone] with Patient's last menstrual period was 05/26/2022.   here with her mother to discuss cervical dysplasia. 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  GYNECOLOGIC HISTORY: Patient's last menstrual period was 05/26/2022. Contraception:Nexplanon  Menopausal hormone therapy: none         OB History     Gravida  5   Para  3   Term  3   Preterm  0   AB  1   Living  3      SAB  0   IAB  0   Ectopic  0   Multiple  0   Live Births  3              Patient Active Problem List   Diagnosis Date Noted   Allergic rhinitis 05/12/2022   ASCUS with positive high risk HPV cervical 07/29/2020   Anxiety 04/21/2020   Alpha thalassemia silent carrier 09/27/2019   Genital herpes 03/01/2018   IUD (intrauterine device) in place 12/07/2017   Pre-diabetes 07/20/2017   Eczema 12/27/2011    Past Medical History:  Diagnosis Date   Anxiety    Chlamydia 09/2011   Depression    on meds, emotions up and down   Headache    STD (sexually transmitted disease)    Umbilical hernia    dx with prev preg   Vaginal Pap smear, abnormal    +in last preg, had colpo- they were talking about freezing the cells    Past Surgical History:  Procedure Laterality Date   BARTHOLIN CYST MARSUPIALIZATION Right 2012   TONSILLECTOMY      Current Outpatient Medications  Medication Sig Dispense Refill   acetaminophen (TYLENOL) 325 MG tablet Take 2 tablets (650 mg total) by mouth every 6 (six) hours as needed. 100 tablet 0   famotidine (PEPCID) 20 MG tablet Take 1 tablet (20 mg total) by mouth 2 (two) times daily. 30 tablet 0   ibuprofen (ADVIL) 800 MG tablet Take 800 mg by mouth every 8 (eight) hours as needed.     metroNIDAZOLE (FLAGYL) 500 MG tablet Take 1 tablet (500 mg total) by mouth 2 (two) times daily. 14 tablet 0   omeprazole (PRILOSEC) 20 MG capsule Take 1 capsule (20 mg  total) by mouth daily. 90 capsule 0   sertraline (ZOLOFT) 100 MG tablet Take half a tablet a day for one week, if tolerating, then increase to one tablet a day 90 tablet 3   valACYclovir (VALTREX) 500 MG tablet Take one tablet po qd, increase to one tablet po BID x 3 days as needed. 90 tablet 4   Vitamin D, Ergocalciferol, (DRISDOL) 1.25 MG (50000 UNIT) CAPS capsule Take 1 capsule (50,000 Units total) by mouth every 7 (seven) days. 12 capsule 0   No current facility-administered medications for this visit.     ALLERGIES: Patient has no known allergies.  Family History  Problem Relation Age of Onset   Hypertension Mother    Hyperlipidemia Mother    Thyroid disease Mother    Diabetes Maternal Uncle    Hypertension Maternal Grandmother     Social History   Socioeconomic History   Marital status: Single    Spouse name: Not on file   Number of children: 1   Years of education: 13   Highest education level: Not on file  Occupational  History   Not on file  Tobacco Use   Smoking status: Never   Smokeless tobacco: Never  Vaping Use   Vaping Use: Never used  Substance and Sexual Activity   Alcohol use: Yes    Alcohol/week: 1.0 standard drink of alcohol    Types: 1 Standard drinks or equivalent per week    Comment: occasionally.   Drug use: No   Sexual activity: Yes    Birth control/protection: Patch    Comment: HSV  Other Topics Concern   Not on file  Social History Narrative   Not on file   Social Determinants of Health   Financial Resource Strain: Not on file  Food Insecurity: Not on file  Transportation Needs: No Transportation Needs (10/10/2017)   PRAPARE - Administrator, Civil Service (Medical): No    Lack of Transportation (Non-Medical): No  Physical Activity: Inactive (10/10/2017)   Exercise Vital Sign    Days of Exercise per Week: 0 days    Minutes of Exercise per Session: 0 min  Stress: No Stress Concern Present (10/10/2017)   [employer] of  Occupational Health - Occupational Stress Questionnaire    Feeling of Stress : Only a little  Social Connections: Not on file  Intimate Partner Violence: Not on file    Review of Systems  All other systems reviewed and are negative.   PHYSICAL EXAMINATION:    BP 130/74   Pulse 68   Ht 5\' 5"  (1.651 m)   Wt 236 lb (107 kg)   LMP 05/26/2022   SpO2 100%   BMI 39.27 kg/m     General appearance: alert, cooperative and appears stated age  11. Dysplasia of cervix, high grade CIN 2 Discussed CIN 2, treatment and possible continued surveillance. She is not planning on having more children. Discussed leep and the risks of the procedure.  Discussed HPV, HPV vaccination (she is checking if she has been vaccinated). Questions were answered, she would like to proceed with LEEP. Consent has been signed. -Will pretreat with ativan, her mother will drive her.  26 minutes in total patient care.

## 2022-06-08 NOTE — Patient Instructions (Signed)
Loop Electrosurgical Excision Procedure Loop electrosurgical excision procedure (LEEP) is the cutting and removal (excision) of tissue from the cervix. The cervix is the bottom part of the uterus that opens into the vagina. The tissue that is removed from the cervix is examined to see if there are cancer cells or cells that might turn into cancer (precancerous cells). LEEP may be done when: You have abnormal bleeding from your cervix. You have an abnormal Pap test result. Your health care provider finds abnormalities on your cervix during an exam. LEEP typically only takes a few minutes and is often done in the health care provider's office. The procedure is safe for women who are trying to get pregnant. The procedure is usually not done during a menstrual period or during pregnancy. Tell a health care provider about: Any allergies you have. All medicines you are taking, including vitamins, herbs, eye drops, creams, and over-the-counter medicines. Any problems you or family members have had with anesthetic medicines. Any bleeding problems you have. Any medical conditions you have or have had. This includes current or past vaginal infections, such as herpes or STIs (sexually transmitted infections). Whether you are pregnant or may be pregnant. If you are having vaginal bleeding on the day of the procedure. What are the risks? Generally, this is a safe procedure. However, problems may occur, including: Infection. Bleeding. Allergic reactions to medicines. Changes or scarring in the cervix. Damage to nearby structures or organs. Increased risk of early (preterm) labor in future pregnancies. What happens before the procedure? Ask your health care provider about: Changing or stopping your regular medicines. This is especially important if you are taking diabetes medicines or blood thinners. Taking medicines such as aspirin and ibuprofen. These medicines can thin your blood. Do not take these  medicines unless your health care provider tells you to take them. Taking over-the-counter medicines, vitamins, herbs, and supplements. Your health care provider may recommend that you take pain medicine before the procedure. Ask your health care provider if you should plan to have a responsible adult take you home after the procedure. What happens during the procedure?  An instrument called a speculum will be placed in your vagina. This will allow your health care provider to see your cervix. You will be given a medicine to numb the area (local anesthetic). The medicine will be injected into your cervix and the surrounding area. A solution will be applied to your cervix. This solution will help the health care provider find the abnormal cells that need to be removed. A thin wire loop will be passed through your vagina to your cervix. The wire will remove layers of abnormal cervical cells. The wire will burn (cauterize) the cervical tissue with an electrical current during cell removal. Open blood vessels will be cauterized to prevent bleeding. You might feel some pressure, aching, and cramping. If you feel like you will faint during the procedure, tell your health care provider right away. A paste may be applied to the cauterized area of your cervix to help control bleeding. The sample of cervical tissue will be sent to a lab and looked at under a microscope. The procedure may vary among health care providers and hospitals. What can I expect after the procedure? After the procedure, it is common to have: Mild abdominal cramps that may last for up to 1 week. A small amount of pink-tinged or bloody vaginal discharge, including light to moderate bleeding, for 1-2 weeks. A brown- or black-colored discharge coming from your  vagina, if a paste was used on the cervix to control bleeding. It is up to you to get the results of your procedure. Ask your health care provider, or the department that is  doing the procedure, when your results will be ready. Follow these instructions at home: Take over-the-counter and prescription medicines only as told by your health care provider. Return to your normal activities as told by your health care provider. Ask your health care provider what activities are safe for you. Do not put anything in your vagina for 2 weeks after the procedure or until your health care provider says that it is okay. This includes tampons, creams, and douches. Do not have sex until your health care provider approves. Keep all follow-up visits. This is important. Contact a health care provider if: You have a fever or chills. You feel very weak. You have blood clots or bleeding that is heavier than a normal menstrual period. Bleeding that soaks a pad in less than 1 hour is considered heavy bleeding. You develop a bad-smelling discharge from your vagina. You have severe abdominal pain or cramping. Summary Loop electrosurgical excision procedure (LEEP) is the removal of tissue from the cervix. The removed tissue will be checked for precancerous cells or cancer cells. LEEP typically only takes a few minutes and is often done in your health care provider's office. Do not put anything in your vagina for 2 weeks after the procedure or until your health care provider says that it is okay. This includes tampons, creams, and douches. Ask your health care provider, or the department that is doing the procedure, when your results will be ready. This information is not intended to replace advice given to you by your health care provider. Make sure you discuss any questions you have with your health care provider. Document Revised: 06/04/2020 Document Reviewed: 06/04/2020 Elsevier Patient Education  2024 [employer].

## 2022-06-09 ENCOUNTER — Telehealth: Payer: Self-pay

## 2022-06-09 NOTE — Telephone Encounter (Signed)
Greig Castilla w/ [facility] called and LVM in triage line in regards to recent rx sent to pharmacy for Lorazepam 1mg  tab #30 w/ 0 refills. Sig: Pt to take 1 hour prior to procedure.  Greig Castilla inquiring if rx needed to be adjusted or sig only.  Per JJ: "My error, the pt was only supposed to be provided with one tablet of the medication. Please contact pharmacy and inform."  Pharmacy closed for lunch when called. Detailed msg left on pharmacy VM regarding the change of the dispensed quantity and sig. Advised them to cb with any additional questions/concerns. Will route to provider and close.

## 2022-06-25 ENCOUNTER — Ambulatory Visit: Payer: Medicaid Other | Admitting: Obstetrics and Gynecology

## 2022-06-25 ENCOUNTER — Other Ambulatory Visit (HOSPITAL_COMMUNITY)
Admission: RE | Admit: 2022-06-25 | Discharge: 2022-06-25 | Disposition: A | Discharge status: Home / Self Care | Payer: Medicaid Other | Source: Ambulatory Visit | Attending: Obstetrics and Gynecology | Admitting: Obstetrics and Gynecology

## 2022-06-25 ENCOUNTER — Encounter: Payer: Self-pay | Admitting: Obstetrics and Gynecology

## 2022-06-25 VITALS — BP 122/80 | Ht 65.0 in | Wt 236.0 lb

## 2022-06-25 DIAGNOSIS — N871 Moderate cervical dysplasia: Secondary | ICD-10-CM | POA: Diagnosis not present

## 2022-06-25 DIAGNOSIS — Z113 Encounter for screening for infections with a predominantly sexual mode of transmission: Secondary | ICD-10-CM

## 2022-06-25 DIAGNOSIS — N898 Other specified noninflammatory disorders of vagina: Secondary | ICD-10-CM

## 2022-06-25 HISTORY — PX: LEEP: SHX91

## 2022-06-25 LAB — WET PREP FOR TRICH, YEAST, CLUE

## 2022-06-25 NOTE — Patient Instructions (Signed)

## 2022-06-25 NOTE — Progress Notes (Signed)
GYNECOLOGY  VISIT   HPI: 27 y.o.   Single Black or African American Not Hispanic or Latino  female   [phone] with Patient's last menstrual period was 05/26/2022.   here for a leep for CIN II. Pt requested STD swab (vaginal discharge)/ She c/o a yellow, thick vaginal d/c. Slight itching, no odor. Wants cervical cultures, no blood work.    GYNECOLOGIC HISTORY: Patient's last menstrual period was 05/26/2022. Contraception:nexplanon (placed in 10/23)  Menopausal hormone therapy: NA        OB History     Gravida  5   Para  3   Term  3   Preterm  0   AB  1   Living  3      SAB  0   IAB  0   Ectopic  0   Multiple  0   Live Births  3              Patient Active Problem List   Diagnosis Date Noted   Allergic rhinitis 05/12/2022   ASCUS with positive high risk HPV cervical 07/29/2020   Anxiety 04/21/2020   Alpha thalassemia silent carrier 09/27/2019   Genital herpes 03/01/2018   IUD (intrauterine device) in place 12/07/2017   Pre-diabetes 07/20/2017   Eczema 12/27/2011    Past Medical History:  Diagnosis Date   Anxiety    Chlamydia 09/2011   Depression    on meds, emotions up and down   Headache    STD (sexually transmitted disease)    Umbilical hernia    dx with prev preg   Vaginal Pap smear, abnormal    +in last preg, had colpo- they were talking about freezing the cells    Past Surgical History:  Procedure Laterality Date   BARTHOLIN CYST MARSUPIALIZATION Right 2012   TONSILLECTOMY      Current Outpatient Medications  Medication Sig Dispense Refill   acetaminophen (TYLENOL) 325 MG tablet Take 2 tablets (650 mg total) by mouth every 6 (six) hours as needed. 100 tablet 0   famotidine (PEPCID) 20 MG tablet Take 1 tablet (20 mg total) by mouth 2 (two) times daily. 30 tablet 0   ibuprofen (ADVIL) 800 MG tablet Take 800 mg by mouth every 8 (eight) hours as needed.     LORazepam (ATIVAN) 1 MG tablet Take 1 hour prior to your procedure 30 tablet 0    omeprazole (PRILOSEC) 20 MG capsule Take 1 capsule (20 mg total) by mouth daily. 90 capsule 0   sertraline (ZOLOFT) 100 MG tablet Take half a tablet a day for one week, if tolerating, then increase to one tablet a day 90 tablet 3   valACYclovir (VALTREX) 500 MG tablet Take one tablet po qd, increase to one tablet po BID x 3 days as needed. 90 tablet 4   Vitamin D, Ergocalciferol, (DRISDOL) 1.25 MG (50000 UNIT) CAPS capsule Take 1 capsule (50,000 Units total) by mouth every 7 (seven) days. 12 capsule 0   No current facility-administered medications for this visit.     ALLERGIES: Patient has no known allergies.  Family History  Problem Relation Age of Onset   Hypertension Mother    Hyperlipidemia Mother    Thyroid disease Mother    Diabetes Maternal Uncle    Hypertension Maternal Grandmother     Social History   Socioeconomic History   Marital status: Single    Spouse name: Not on file   Number of children: 1   Years of education:  13   Highest education level: Not on file  Occupational History   Not on file  Tobacco Use   Smoking status: Never   Smokeless tobacco: Never  Vaping Use   Vaping Use: Never used  Substance and Sexual Activity   Alcohol use: Yes    Alcohol/week: 1.0 standard drink of alcohol    Types: 1 Standard drinks or equivalent per week    Comment: occasionally.   Drug use: No   Sexual activity: Yes    Birth control/protection: Patch    Comment: HSV  Other Topics Concern   Not on file  Social History Narrative   Not on file   Social Determinants of Health   Financial Resource Strain: Not on file  Food Insecurity: Not on file  Transportation Needs: No Transportation Needs (10/10/2017)   PRAPARE - Administrator, Civil Service (Medical): No    Lack of Transportation (Non-Medical): No  Physical Activity: Inactive (10/10/2017)   Exercise Vital Sign    Days of Exercise per Week: 0 days    Minutes of Exercise per Session: 0 min  Stress: No  Stress Concern Present (10/10/2017)   [employer] of Occupational Health - Occupational Stress Questionnaire    Feeling of Stress : Only a little  Social Connections: Not on file  Intimate Partner Violence: Not on file    Review of Systems  All other systems reviewed and are negative.   PHYSICAL EXAMINATION:    LMP 05/26/2022     General appearance: alert, cooperative and appears stated age  Pelvic: External genitalia:  no lesions              Urethra:  normal appearing urethra with no masses, tenderness or lesions              Bartholins and Skenes: normal                 Vagina: normal appearing vagina with normal color, slight increase in white vaginal d/c              Cervix: no lesions                Procedure: The patient was counseled as to the risks of the procedure, including: infection, bleeding, future pregnancy risks and cervical stenosis. A consent form was signed.  Under colposcopic guidance, Lugols solution was placed on the cervix and a paracervical block was injected using 1% lidocaine with epinephrine. Under colposcopic guidance, the 0.8 x 2 cm loop was used to remove a portion of the ectocervix taking care to get the entire transformation zone (2 passes were needed).  A second 1 x 1 cm loop was used to remove a portion of the endocervix. The settings were 55 cut, 50 coag with a blend of 1.  An ECC was performed. The cautery ball was then used to cauterize the base of the biopsy site and monsels were placed. The patient tolerated the procedure well.   Chaperone was present for exam.  1. Dysplasia of cervix, high grade CIN 2 - Surgical pathology( [place]/ [facility])  2. Vaginal discharge - WET PREP FOR TRICH, YEAST, CLUE: negative  3. Screening examination for STD (sexually transmitted disease) - SURESWAB CT/NG/T. vaginalis

## 2022-06-26 LAB — SURESWAB CT/NG/T. VAGINALIS
C. trachomatis RNA, TMA: NOT DETECTED
N. gonorrhoeae RNA, TMA: NOT DETECTED
Trichomonas vaginalis RNA: NOT DETECTED

## 2022-07-01 ENCOUNTER — Ambulatory Visit: Payer: Medicaid Other | Admitting: Obstetrics and Gynecology

## 2022-07-06 LAB — SURGICAL PATHOLOGY

## 2022-07-20 NOTE — Progress Notes (Deleted)
GYNECOLOGY  VISIT   HPI: 27 y.o.   Single  African American  female   [phone] with No LMP recorded.   here for   LEEP f/u   GYNECOLOGIC HISTORY: No LMP recorded. Contraception:  nexplanon (10/23) Menopausal hormone therapy:  n/a Last mammogram:  n/a Last pap smear:   05/12/22 LSIL: HR HPV positive, 10/02/21 LSIL: HR HPV positive, 07/21/20 ASCUS: HR HPV positive        OB History     Gravida  5   Para  3   Term  3   Preterm  0   AB  1   Living  3      SAB  0   IAB  0   Ectopic  0   Multiple  0   Live Births  3              Patient Active Problem List   Diagnosis Date Noted   Allergic rhinitis 05/12/2022   ASCUS with positive high risk HPV cervical 07/29/2020   Anxiety 04/21/2020   Alpha thalassemia silent carrier 09/27/2019   Genital herpes 03/01/2018   IUD (intrauterine device) in place 12/07/2017   Pre-diabetes 07/20/2017   Eczema 12/27/2011    Past Medical History:  Diagnosis Date   Anxiety    Chlamydia 09/2011   Depression    on meds, emotions up and down   Headache    STD (sexually transmitted disease)    Umbilical hernia    dx with prev preg   Vaginal Pap smear, abnormal    +in last preg, had colpo- they were talking about freezing the cells    Past Surgical History:  Procedure Laterality Date   BARTHOLIN CYST MARSUPIALIZATION Right 2012   TONSILLECTOMY      Current Outpatient Medications  Medication Sig Dispense Refill   acetaminophen (TYLENOL) 325 MG tablet Take 2 tablets (650 mg total) by mouth every 6 (six) hours as needed. 100 tablet 0   famotidine (PEPCID) 20 MG tablet Take 1 tablet (20 mg total) by mouth 2 (two) times daily. 30 tablet 0   ibuprofen (ADVIL) 800 MG tablet Take 800 mg by mouth every 8 (eight) hours as needed.     LORazepam (ATIVAN) 1 MG tablet Take 1 hour prior to your procedure 30 tablet 0   omeprazole (PRILOSEC) 20 MG capsule Take 1 capsule (20 mg total) by mouth daily. 90 capsule 0   sertraline (ZOLOFT) 100  MG tablet Take half a tablet a day for one week, if tolerating, then increase to one tablet a day 90 tablet 3   valACYclovir (VALTREX) 500 MG tablet Take one tablet po qd, increase to one tablet po BID x 3 days as needed. 90 tablet 4   Vitamin D, Ergocalciferol, (DRISDOL) 1.25 MG (50000 UNIT) CAPS capsule Take 1 capsule (50,000 Units total) by mouth every 7 (seven) days. 12 capsule 0   No current facility-administered medications for this visit.     ALLERGIES: Patient has no known allergies.  Family History  Problem Relation Age of Onset   Hypertension Mother    Hyperlipidemia Mother    Thyroid disease Mother    Diabetes Maternal Uncle    Hypertension Maternal Grandmother     Social History   Socioeconomic History   Marital status: Single    Spouse name: Not on file   Number of children: 1   Years of education: 13   Highest education level: Not on file  Occupational History  Not on file  Tobacco Use   Smoking status: Never   Smokeless tobacco: Never  Vaping Use   Vaping Use: Never used  Substance and Sexual Activity   Alcohol use: Yes    Alcohol/week: 1.0 standard drink of alcohol    Types: 1 Standard drinks or equivalent per week    Comment: occasionally.   Drug use: No   Sexual activity: Yes    Birth control/protection: Patch    Comment: HSV  Other Topics Concern   Not on file  Social History Narrative   Not on file   Social Determinants of Health   Financial Resource Strain: Not on file  Food Insecurity: Not on file  Transportation Needs: No Transportation Needs (10/10/2017)   PRAPARE - Administrator, Civil Service (Medical): No    Lack of Transportation (Non-Medical): No  Physical Activity: Inactive (10/10/2017)   Exercise Vital Sign    Days of Exercise per Week: 0 days    Minutes of Exercise per Session: 0 min  Stress: No Stress Concern Present (10/10/2017)   [employer] of Occupational Health - Occupational Stress Questionnaire     Feeling of Stress : Only a little  Social Connections: Not on file  Intimate Partner Violence: Not on file    Review of Systems  PHYSICAL EXAMINATION:    There were no vitals taken for this visit.    General appearance: alert, cooperative and appears stated age Head: Normocephalic, without obvious abnormality, atraumatic Neck: no adenopathy, supple, symmetrical, trachea midline and thyroid normal to inspection and palpation Lungs: clear to auscultation bilaterally Breasts: normal appearance, no masses or tenderness, No nipple retraction or dimpling, No nipple discharge or bleeding, No axillary or supraclavicular adenopathy Heart: regular rate and rhythm Abdomen: soft, non-tender, no masses,  no organomegaly Extremities: extremities normal, atraumatic, no cyanosis or edema Skin: Skin color, texture, turgor normal. No rashes or lesions Lymph nodes: Cervical, supraclavicular, and axillary nodes normal. No abnormal inguinal nodes palpated Neurologic: Grossly normal  Pelvic: External genitalia:  no lesions              Urethra:  normal appearing urethra with no masses, tenderness or lesions              Bartholins and Skenes: normal                 Vagina: normal appearing vagina with normal color and discharge, no lesions              Cervix: no lesions                Bimanual Exam:  Uterus:  normal size, contour, position, consistency, mobility, non-tender              Adnexa: no mass, fullness, tenderness              Rectal exam: {yes no:314532}.  Confirms.              Anus:  normal sphincter tone, no lesions  Chaperone was present for exam:  ***  ASSESSMENT     PLAN     An After Visit Summary was printed and given to the patient.  ______ minutes face to face time of which over 50% was spent in counseling.

## 2022-07-27 ENCOUNTER — Ambulatory Visit: Payer: Medicaid Other | Admitting: Obstetrics and Gynecology

## 2022-07-29 ENCOUNTER — Ambulatory Visit: Payer: Medicaid Other | Admitting: Obstetrics and Gynecology

## 2022-07-29 ENCOUNTER — Encounter: Payer: Self-pay | Admitting: Obstetrics and Gynecology

## 2022-07-29 VITALS — BP 122/70 | HR 91 | Ht 65.0 in | Wt 236.0 lb

## 2022-07-29 DIAGNOSIS — N871 Moderate cervical dysplasia: Secondary | ICD-10-CM

## 2022-07-29 DIAGNOSIS — Z9889 Other specified postprocedural states: Secondary | ICD-10-CM | POA: Diagnosis not present

## 2022-07-29 NOTE — Patient Instructions (Signed)
Human Papillomavirus (HPV) Vaccine Injection What is this medication? HUMAN PAPILLOMAVIRUS VACCINE (HYOO muhn pap uh LOH muh vahy ruhs vak SEEN) reduces the risk of human papillomavirus (HPV). It does not treat HPV. It is still possible to get HPV after receiving this vaccine, but the symptoms may be less severe or not last as long. It works by helping your immune system learn how to fight off a future infection. This medicine may be used for other purposes; ask your health care provider or pharmacist if you have questions. COMMON BRAND NAME(S): Gardasil 9 What should I tell my care team before I take this medication? They need to know if you have any of these conditions: Fever Hemophilia HIV or AIDS Immune system problems Infection Low platelets An unusual reaction to human papillomavirus vaccine, yeast, other vaccines, other medications, foods, dyes, or preservatives Pregnant or trying to get pregnant Breastfeeding How should I use this medication? This vaccine is injected into a muscle. It is given by your care team. This vaccine requires 2 or 3 doses to get the full benefit. Set a reminder for when your next dose is due. A copy of the Vaccine Information Statement will be given before each vaccination. Be sure to read this information carefully each time. This sheet may change often. Talk to your care team about the use of this medication in children. While it may be prescribed for children as young as 9 years for selected conditions, precautions do apply. Overdosage: If you think you have taken too much of this medicine contact a poison control center or emergency room at once. NOTE: This medicine is only for you. Do not share this medicine with others. What if I miss a dose? Keep appointments for follow-up doses as directed. It is important not to miss your dose. Call your care team if you are unable to keep an appointment. What may interact with this medication? Certain medications  for arthritis Medications for organ transplant Medications to treat cancer Steroid medications, such as prednisone or cortisone This list may not describe all possible interactions. Give your health care provider a list of all the medicines, herbs, non-prescription drugs, or dietary supplements you use. Also tell them if you smoke, drink alcohol, or use illegal drugs. Some items may interact with your medicine. What should I watch for while using this medication? Visit your care team regularly. Report any side effects to your care team right away. This vaccine, like all vaccines, may not fully protect everyone. What side effects may I notice from receiving this medication? Side effects that you should report to your care team as soon as possible: Allergic reactions--skin rash, itching, hives, swelling of the face, lips, tongue, or throat Feeling faint or lightheaded Side effects that usually do not require medical attention (report these to your care team if they continue or are bothersome): Diarrhea Dizziness Fatigue Fever Headache Nausea Pain, redness, irritation, or bruising at the injection site This list may not describe all possible side effects. Call your doctor for medical advice about side effects. You may report side effects to FDA at 1-800-FDA-1088. Where should I keep my medication? This vaccine is only given by your care team. It will not be stored at home. NOTE: This sheet is a summary. It may not cover all possible information. If you have questions about this medicine, talk to your doctor, pharmacist, or health care provider.  2024 Elsevier/Gold Standard (2021-06-10 00:00:00)

## 2022-07-29 NOTE — Progress Notes (Unsigned)
GYNECOLOGY  VISIT   HPI: 27 y.o.   Single  African American  female   [phone] with Patient's last menstrual period was 07/05/2022.   here for dysplasia follow up.   Had cycle the end of June and then one day of bleeding last week.   Status post LEEP with Dr. Oscar La 06/25/22.  Pathology LEEP:  CIN II, margins negative.  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  Pap 05/12/22:   LGSIL, pos HR HPV  She is not sure if she had Gardasil.   GYNECOLOGIC HISTORY: Patient's last menstrual period was 07/05/2022. Contraception:  Nexplanon (10/2021) Menopausal hormone therapy:  n/a Last mammogram:  n/a Last pap smear:   05/12/22 LSIL: HR HPV positive, 10/02/21 LSIL: HR HPV positive        OB History     Gravida  5   Para  3   Term  3   Preterm  0   AB  1   Living  3      SAB  0   IAB  0   Ectopic  0   Multiple  0   Live Births  3              Patient Active Problem List   Diagnosis Date Noted   Allergic rhinitis 05/12/2022   ASCUS with positive high risk HPV cervical 07/29/2020   Anxiety 04/21/2020   Alpha thalassemia silent carrier 09/27/2019   Genital herpes 03/01/2018   IUD (intrauterine device) in place 12/07/2017   Pre-diabetes 07/20/2017   Eczema 12/27/2011    Past Medical History:  Diagnosis Date   Anxiety    Chlamydia 09/2011   Depression    on meds, emotions up and down   Headache    STD (sexually transmitted disease)    Umbilical hernia    dx with prev preg   Vaginal Pap smear, abnormal    +in last preg, had colpo- they were talking about freezing the cells    Past Surgical History:  Procedure Laterality Date   BARTHOLIN CYST MARSUPIALIZATION Right 2012   TONSILLECTOMY      Current Outpatient Medications  Medication Sig Dispense Refill   acetaminophen (TYLENOL) 325 MG tablet Take 2 tablets (650 mg total) by mouth every 6 (six) hours as needed. 100 tablet 0   famotidine (PEPCID) 20 MG tablet Take 1 tablet (20 mg total) by mouth 2 (two) times daily. 30  tablet 0   ibuprofen (ADVIL) 800 MG tablet Take 800 mg by mouth every 8 (eight) hours as needed.     LORazepam (ATIVAN) 1 MG tablet Take 1 hour prior to your procedure 30 tablet 0   omeprazole (PRILOSEC) 20 MG capsule Take 1 capsule (20 mg total) by mouth daily. 90 capsule 0   sertraline (ZOLOFT) 100 MG tablet Take half a tablet a day for one week, if tolerating, then increase to one tablet a day 90 tablet 3   valACYclovir (VALTREX) 500 MG tablet Take one tablet po qd, increase to one tablet po BID x 3 days as needed. 90 tablet 4   Vitamin D, Ergocalciferol, (DRISDOL) 1.25 MG (50000 UNIT) CAPS capsule Take 1 capsule (50,000 Units total) by mouth every 7 (seven) days. 12 capsule 0   No current facility-administered medications for this visit.     ALLERGIES: Patient has no known allergies.  Family History  Problem Relation Age of Onset   Hypertension Mother    Hyperlipidemia Mother    Thyroid disease Mother  Diabetes Maternal Uncle    Hypertension Maternal Grandmother     Social History   Socioeconomic History   Marital status: Single    Spouse name: Not on file   Number of children: 1   Years of education: 13   Highest education level: Not on file  Occupational History   Not on file  Tobacco Use   Smoking status: Never   Smokeless tobacco: Never  Vaping Use   Vaping status: Never Used  Substance and Sexual Activity   Alcohol use: Yes    Alcohol/week: 1.0 standard drink of alcohol    Types: 1 Standard drinks or equivalent per week    Comment: occasionally.   Drug use: No   Sexual activity: Yes    Birth control/protection: Patch    Comment: HSV  Other Topics Concern   Not on file  Social History Narrative   Not on file   Social Determinants of Health   Financial Resource Strain: Not on file  Food Insecurity: Not on file  Transportation Needs: No Transportation Needs (10/10/2017)   PRAPARE - Administrator, Civil Service (Medical): No    Lack of  Transportation (Non-Medical): No  Physical Activity: Inactive (10/10/2017)   Exercise Vital Sign    Days of Exercise per Week: 0 days    Minutes of Exercise per Session: 0 min  Stress: No Stress Concern Present (10/10/2017)   [employer] of Occupational Health - Occupational Stress Questionnaire    Feeling of Stress : Only a little  Social Connections: Not on file  Intimate Partner Violence: Not on file    Review of Systems  All other systems reviewed and are negative.   PHYSICAL EXAMINATION:    BP 122/70 (BP Location: Right Arm, Patient Position: Sitting, Cuff Size: Normal)   Pulse 91   Ht 5\' 5"  (1.651 m)   Wt 236 lb (107 kg)   LMP 07/05/2022   SpO2 98%   BMI 39.27 kg/m     General appearance: alert, cooperative and appears stated age   Pelvic: External genitalia:  no lesions              Urethra:  normal appearing urethra with no masses, tenderness or lesions              Bartholins and Skenes: normal                 Vagina: normal appearing vagina with normal color and discharge, no lesions              Cervix: no lesions.  Consistent with LEEP.  Slightly friable ectropion.                 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   PLAN  Final pathology report reviewed with patient.  We discussed Gardasil vaccine.  She will consider.  Annual exam with follow up pap and HR HPV testing in 6 months.    20 min  total time was spent for this patient encounter, including preparation, face-to-face counseling with the patient, coordination of care, and documentation of the encounter.

## 2022-08-04 ENCOUNTER — Ambulatory Visit: Payer: Medicaid Other | Admitting: Radiology

## 2022-09-09 ENCOUNTER — Other Ambulatory Visit (HOSPITAL_BASED_OUTPATIENT_CLINIC_OR_DEPARTMENT_OTHER): Payer: Self-pay

## 2022-10-12 ENCOUNTER — Ambulatory Visit: Payer: Medicaid Other | Admitting: Radiology

## 2022-12-07 ENCOUNTER — Encounter: Payer: Self-pay | Admitting: Nurse Practitioner

## 2022-12-07 ENCOUNTER — Ambulatory Visit (INDEPENDENT_AMBULATORY_CARE_PROVIDER_SITE_OTHER): Payer: Medicaid Other | Admitting: Nurse Practitioner

## 2022-12-07 VITALS — BP 108/66 | HR 94

## 2022-12-07 DIAGNOSIS — Z113 Encounter for screening for infections with a predominantly sexual mode of transmission: Secondary | ICD-10-CM | POA: Diagnosis not present

## 2022-12-07 DIAGNOSIS — A6000 Herpesviral infection of urogenital system, unspecified: Secondary | ICD-10-CM | POA: Diagnosis not present

## 2022-12-07 DIAGNOSIS — K219 Gastro-esophageal reflux disease without esophagitis: Secondary | ICD-10-CM | POA: Diagnosis not present

## 2022-12-07 DIAGNOSIS — B9689 Other specified bacterial agents as the cause of diseases classified elsewhere: Secondary | ICD-10-CM

## 2022-12-07 DIAGNOSIS — N760 Acute vaginitis: Secondary | ICD-10-CM

## 2022-12-07 DIAGNOSIS — N898 Other specified noninflammatory disorders of vagina: Secondary | ICD-10-CM

## 2022-12-07 LAB — WET PREP FOR TRICH, YEAST, CLUE

## 2022-12-07 MED ORDER — OMEPRAZOLE 20 MG PO CPDR: 20.0000 mg | DELAYED_RELEASE_CAPSULE | Freq: Every day | ORAL | 0 refills | Status: DC

## 2022-12-07 MED ORDER — VALACYCLOVIR HCL 500 MG PO TABS
ORAL_TABLET | ORAL | 0 refills | Status: DC
Start: 2022-12-07 — End: 2023-01-03

## 2022-12-07 MED ORDER — FAMOTIDINE 20 MG PO TABS
20.0000 mg | ORAL_TABLET | Freq: Two times a day (BID) | ORAL | 0 refills | Status: DC
Start: 2022-12-07 — End: 2023-03-03

## 2022-12-07 MED ORDER — METRONIDAZOLE 500 MG PO TABS
500.0000 mg | ORAL_TABLET | Freq: Two times a day (BID) | ORAL | 0 refills | Status: DC
Start: 2022-12-07 — End: 2023-02-02

## 2022-12-07 NOTE — Progress Notes (Signed)
   Acute Office Visit  Subjective:    Patient ID: Jean Solis, female    DOB: [birth year], 27 y.o.   MRN: [id number]   HPI 27 y.o. [id number] presents today for vaginal odor and discharge x 1 week. Would like STD screening. Requesting refills on Pepcid, Prilosec and Valtrex. Overdue for annual and plans to schedule in January since she is starting new job next week.   No LMP recorded. Patient has had an implant.    Review of Systems  Constitutional: Negative.   Genitourinary:  Positive for vaginal discharge.       Vaginal odor       Objective:    Physical Exam Constitutional:      Appearance: Normal appearance.  Genitourinary:    General: Normal vulva.     Vagina: Vaginal discharge present. No erythema.     Uterus: Normal.      BP 108/66   Pulse 94   SpO2 99%  Wt Readings from Last 3 Encounters:  07/29/22 236 lb (107 kg)  06/25/22 236 lb (107 kg)  06/08/22 236 lb (107 kg)        Patient informed chaperone available to be present for breast and/or pelvic exam. Patient has requested no chaperone to be present. Patient has been advised what will be completed during breast and pelvic exam.   Wet prep + clue cells (+ odor)  Assessment & Plan:   Problem List Items Addressed This Visit       Genitourinary   Genital herpes   Relevant Medications   valACYclovir (VALTREX) 500 MG tablet      Other Visit Diagnoses     Bacterial vaginosis    -  Primary   Relevant Medications      metroNIDAZOLE (FLAGYL) 500 MG tablet   Vaginal discharge       Relevant Orders   WET PREP FOR TRICH, YEAST, CLUE   Screening examination for STD (sexually transmitted disease)       Relevant Orders   C. trachomatis/N. gonorrhoeae RNA   RPR   HIV Antibody (routine testing w rflx)   Gastroesophageal reflux disease, unspecified whether esophagitis present       Relevant Medications   omeprazole (PRILOSEC) 20 MG capsule   famotidine (PEPCID) 20 MG tablet      Plans: Wet prep  positive for clue cells - Flagyl 500 mg BID x 7 days. Refills provided. Aware needs annual for future refills. STD panel pending.   Return if symptoms worsen or fail to improve.    Olivia Mackie DNP, 11:31 AM 12/07/2022

## 2022-12-08 LAB — C. TRACHOMATIS/N. GONORRHOEAE RNA
C. trachomatis RNA, TMA: NOT DETECTED
N. gonorrhoeae RNA, TMA: NOT DETECTED

## 2022-12-08 LAB — HIV ANTIBODY (ROUTINE TESTING W REFLEX): HIV 1&2 Ab, 4th Generation: NONREACTIVE

## 2022-12-08 LAB — RPR: RPR Ser Ql: NONREACTIVE

## 2023-01-01 ENCOUNTER — Other Ambulatory Visit: Payer: Self-pay | Admitting: Nurse Practitioner

## 2023-01-01 DIAGNOSIS — A6000 Herpesviral infection of urogenital system, unspecified: Secondary | ICD-10-CM

## 2023-01-03 NOTE — Telephone Encounter (Signed)
Med refill request: Valtrex Last AEX: 10/02/21 Next AEX: 01/31/2023 Last MMG (if hormonal med) n/a Refill authorized: Last Rx was sent #90 with zero refills on 12/07/2022. Please approve or deny as appropriate.

## 2023-01-18 NOTE — Progress Notes (Unsigned)
28 y.o. [id number] Single African American female here for annual exam. Pt has noticed boils under her arms after shaving.  Does not have boils of the vulva or under her breasts.  Has Nexplanon.  Periods last a couple of days to one week.   Neg STD testing 12/07/22.   Wants repeat STD screening.   Believes she did not have Gardasil vaccine.   Does not take Zoloft for anxiety from Dr. Oscar La.  Her anxiety and mild depression are better.   Leaking breast milk with expression.  Has not breast fed for over a year.   Used hibiclens.  PCP: Patient, No Pcp Per   Patient's last menstrual period was 01/23/2023.     Period Cycle (Days): 28 Period Duration (Days): 3-7 Period Pattern: Regular Menstrual Flow: Heavy Menstrual Control: Tampon, Maxi pad Dysmenorrhea: (!) Moderate     Sexually active: Yes.    The current method of family planning is implant.  Placed 11/10/21.  Menopausal hormone therapy:  n/a Exercising: No.   Smoker:  no  OB History  Gravida Para Term Preterm AB Living  4 3 3  0 1 3  SAB IAB Ectopic Multiple Live Births  0 1 0 0 3    # Outcome Date GA Lbr Len/2nd Weight Sex Type Anes PTL Lv  4 Term 01/24/21 [redacted]w[redacted]d  7 lb 10.8 oz (3.48 kg) M Vag-Spont EPI  LIV  3 Term 03/07/20 [redacted]w[redacted]d 03:41 / 00:09 7 lb 15.5 oz (3.615 kg) M Vag-Spont EPI  LIV     Birth Comments: wnl  2 Term 10/11/17 [redacted]w[redacted]d 14:20 / 00:18 7 lb 3.3 oz (3.269 kg) F Vag-Spont EPI  LIV  1 IAB              HEALTH MAINTENANCE: Last 2 paps:  05/12/22 LSIL: HR HPV positive, 10/02/21 LSIL: HR HPV positive History of abnormal Pap or positive HPV:  yes, LEEP 06/25/22 Mammogram:   n/a Colonoscopy:  n/a Bone Density:  n/a  Result  n/a   Immunization History  Administered Date(s) Administered   Influenza,inj,Quad PF,6+ Mos 09/24/2019   Tdap 12/16/2013, 08/10/2017, 01/01/2020, 10/28/2020      reports that she has never smoked. She has never used smokeless tobacco. She reports that she does not currently use  alcohol. She reports that she does not use drugs.  Past Medical History:  Diagnosis Date   Anxiety    Chlamydia 09/2011   Depression    on meds, emotions up and down   Headache    STD (sexually transmitted disease)    Umbilical hernia    dx with prev preg   Vaginal Pap smear, abnormal    +in last preg, had colpo- they were talking about freezing the cells    Past Surgical History:  Procedure Laterality Date   BARTHOLIN CYST MARSUPIALIZATION Right 2012   TONSILLECTOMY      Current Outpatient Medications  Medication Sig Dispense Refill   famotidine (PEPCID) 20 MG tablet Take 1 tablet (20 mg total) by mouth 2 (two) times daily. 180 tablet 0   metroNIDAZOLE (FLAGYL) 500 MG tablet Take 1 tablet (500 mg total) by mouth 2 (two) times daily. 14 tablet 0   omeprazole (PRILOSEC) 20 MG capsule Take 1 capsule (20 mg total) by mouth daily. 90 capsule 0   sertraline (ZOLOFT) 100 MG tablet Take half a tablet a day for one week, if tolerating, then increase to one tablet a day 90 tablet 3   valACYclovir (VALTREX) 500  MG tablet TAKE 1 TABLET DAILY, INCREASE TO 1 TABLET TWICE DAILY FOR 3 DAYS AS NEEDED 180 tablet 1   No current facility-administered medications for this visit.    ALLERGIES: Patient has no known allergies.  Family History  Problem Relation Age of Onset   Hypertension Mother    Hyperlipidemia Mother    Thyroid disease Mother    Diabetes Maternal Uncle    Hypertension Maternal Grandmother     Review of Systems  All other systems reviewed and are negative.   PHYSICAL EXAM:  BP 122/68 (BP Location: Left Arm, Patient Position: Sitting, Cuff Size: Large)   Pulse 92   Ht 5\' 7"  (1.702 m)   Wt 242 lb (109.8 kg)   LMP 01/23/2023   SpO2 95%   BMI 37.90 kg/m     General appearance: alert, cooperative and appears stated age Head: normocephalic, without obvious abnormality, atraumatic Neck: no adenopathy, supple, symmetrical, trachea midline and thyroid normal to inspection  and palpation Lungs: clear to auscultation bilaterally Breasts: normal appearance, no masses or tenderness, No nipple retraction or dimpling, No nipple discharge or bleeding, No axillary adenopathy Heart: regular rate and rhythm Abdomen: soft, non-tender; no masses, no organomegaly Extremities: extremities normal, atraumatic, no cyanosis or edema Skin: skin color, texture, turgor normal. No rashes or lesions Lymph nodes: cervical, supraclavicular, and axillary nodes normal. Neurologic: grossly normal  Pelvic: External genitalia:  no lesions              No abnormal inguinal nodes palpated.              Urethra:  normal appearing urethra with no masses, tenderness or lesions              Bartholins and Skenes: normal                 Vagina: normal appearing vagina with normal color and discharge, no lesions              Cervix: no lesions              Pap taken: {yes no:314532} Bimanual Exam:  Uterus:  normal size, contour, position, consistency, mobility, non-tender              Adnexa: no mass, fullness, tenderness              Rectal exam: {yes no:314532}.  Confirms.              Anus:  normal sphincter tone, no lesions  Chaperone was present for exam:  {BSCHAPERONE:[id number]::"Emily F, CMA"}  ASSESSMENT: Well woman visit with gynecologic exam Hx LEEP - CIN II.  HSV. Nexplanon.  Boils under her arms.   PLAN: Mammogram screening discussed. Self breast awareness reviewed. Pap and HRV collected:  {yes no:314532} Guidelines for Calcium, Vitamin D, regular exercise program including cardiovascular and weight bearing exercise. Medication refills:  *** {LABS (Optional):23779}  She will check if she had gardasil vaccination at health Dept. Follow up:  ***    Additional counseling given.  {yes [id number]. ***  total time was spent for this patient encounter, including preparation, face-to-face counseling with the patient, coordination of care, and documentation of the encounter in  addition to doing the well woman visit with gynecologic exam.

## 2023-01-31 ENCOUNTER — Ambulatory Visit (INDEPENDENT_AMBULATORY_CARE_PROVIDER_SITE_OTHER): Payer: Medicaid Other | Admitting: Obstetrics and Gynecology

## 2023-01-31 ENCOUNTER — Other Ambulatory Visit (HOSPITAL_COMMUNITY)
Admission: RE | Admit: 2023-01-31 | Discharge: 2023-01-31 | Disposition: A | Discharge status: Home / Self Care | Payer: Medicaid Other | Source: Ambulatory Visit | Attending: Obstetrics and Gynecology | Admitting: Obstetrics and Gynecology

## 2023-01-31 ENCOUNTER — Encounter: Payer: Self-pay | Admitting: Obstetrics and Gynecology

## 2023-01-31 VITALS — BP 122/68 | HR 92 | Ht 67.0 in | Wt 242.0 lb

## 2023-01-31 DIAGNOSIS — Z113 Encounter for screening for infections with a predominantly sexual mode of transmission: Secondary | ICD-10-CM | POA: Diagnosis not present

## 2023-01-31 DIAGNOSIS — Z124 Encounter for screening for malignant neoplasm of cervix: Secondary | ICD-10-CM

## 2023-01-31 DIAGNOSIS — N898 Other specified noninflammatory disorders of vagina: Secondary | ICD-10-CM | POA: Diagnosis not present

## 2023-01-31 DIAGNOSIS — Z0000 Encounter for general adult medical examination without abnormal findings: Secondary | ICD-10-CM

## 2023-01-31 DIAGNOSIS — Z87410 Personal history of cervical dysplasia: Secondary | ICD-10-CM | POA: Diagnosis present

## 2023-01-31 DIAGNOSIS — Z1159 Encounter for screening for other viral diseases: Secondary | ICD-10-CM

## 2023-01-31 DIAGNOSIS — L732 Hidradenitis suppurativa: Secondary | ICD-10-CM

## 2023-01-31 DIAGNOSIS — Z114 Encounter for screening for human immunodeficiency virus [HIV]: Secondary | ICD-10-CM

## 2023-01-31 DIAGNOSIS — Z01419 Encounter for gynecological examination (general) (routine) without abnormal findings: Secondary | ICD-10-CM | POA: Diagnosis not present

## 2023-01-31 DIAGNOSIS — N6452 Nipple discharge: Secondary | ICD-10-CM | POA: Diagnosis not present

## 2023-01-31 DIAGNOSIS — A6000 Herpesviral infection of urogenital system, unspecified: Secondary | ICD-10-CM

## 2023-01-31 MED ORDER — CLINDAMYCIN PHOSPHATE 1 % EX LOTN: TOPICAL_LOTION | Freq: Two times a day (BID) | CUTANEOUS | 0 refills | Status: DC

## 2023-02-01 LAB — LIPID PANEL
Cholesterol: 118 mg/dL (ref <200)
HDL: 54 mg/dL (ref >=50)
LDL Cholesterol (Calc): 40 mg/dL
Non-HDL Cholesterol (Calc): 64 mg/dL (ref <130)
Total CHOL/HDL Ratio: 2.2 (calc) (ref <5.0)
Triglycerides: 162 mg/dL — ABNORMAL HIGH (ref <150)

## 2023-02-01 LAB — CBC
HCT: 37.2 % (ref 35.0–45.0)
Hemoglobin: 12 g/dL (ref 11.7–15.5)
MCH: 26.3 pg — ABNORMAL LOW (ref 27.0–33.0)
MCHC: 32.3 g/dL (ref 32.0–36.0)
MCV: 81.6 fL (ref 80.0–100.0)
MPV: 10.2 fL (ref 7.5–12.5)
Platelets: 390 10*3/uL (ref 140–400)
RBC: 4.56 10*6/uL (ref 3.80–5.10)
RDW: 14 % (ref 11.0–15.0)
WBC: 8.7 10*3/uL (ref 3.8–10.8)

## 2023-02-01 LAB — COMPREHENSIVE METABOLIC PANEL
AG Ratio: 1.4 (calc) (ref 1.0–2.5)
ALT: 10 U/L (ref 6–29)
AST: 14 U/L (ref 10–30)
Albumin: 4.5 g/dL (ref 3.6–5.1)
Alkaline phosphatase (APISO): 94 U/L (ref 31–125)
BUN: 11 mg/dL (ref 7–25)
CO2: 28 mmol/L (ref 20–32)
Calcium: 9.8 mg/dL (ref 8.6–10.2)
Chloride: 103 mmol/L (ref 98–110)
Creat: 0.7 mg/dL (ref 0.50–0.96)
Globulin: 3.3 g/dL (ref 1.9–3.7)
Glucose, Bld: 76 mg/dL (ref 65–99)
Potassium: 3.8 mmol/L (ref 3.5–5.3)
Sodium: 140 mmol/L (ref 135–146)
Total Bilirubin: 0.3 mg/dL (ref 0.2–1.2)
Total Protein: 7.8 g/dL (ref 6.1–8.1)

## 2023-02-01 LAB — PROLACTIN: Prolactin: 16.7 ng/mL

## 2023-02-01 LAB — CERVICOVAGINAL ANCILLARY ONLY
Bacterial Vaginitis (gardnerella): POSITIVE — AB
Candida Glabrata: NEGATIVE
Candida Vaginitis: NEGATIVE
Comment: NEGATIVE
Comment: NEGATIVE
Comment: NEGATIVE

## 2023-02-01 LAB — TSH: TSH: 1.57 m[IU]/L

## 2023-02-01 LAB — VITAMIN D 25 HYDROXY (VIT D DEFICIENCY, FRACTURES): Vit D, 25-Hydroxy: 14 ng/mL — ABNORMAL LOW (ref 30–100)

## 2023-02-01 LAB — RPR: RPR Ser Ql: NONREACTIVE

## 2023-02-01 LAB — HIV ANTIBODY (ROUTINE TESTING W REFLEX): HIV 1&2 Ab, 4th Generation: NONREACTIVE

## 2023-02-01 LAB — HEPATITIS C ANTIBODY: Hepatitis C Ab: NONREACTIVE

## 2023-02-01 NOTE — Patient Instructions (Signed)

## 2023-02-02 ENCOUNTER — Other Ambulatory Visit: Payer: Self-pay

## 2023-02-02 ENCOUNTER — Other Ambulatory Visit: Payer: Self-pay | Admitting: Obstetrics and Gynecology

## 2023-02-02 ENCOUNTER — Encounter: Payer: Self-pay | Admitting: Obstetrics and Gynecology

## 2023-02-02 DIAGNOSIS — B9689 Other specified bacterial agents as the cause of diseases classified elsewhere: Secondary | ICD-10-CM

## 2023-02-02 DIAGNOSIS — R7989 Other specified abnormal findings of blood chemistry: Secondary | ICD-10-CM

## 2023-02-02 DIAGNOSIS — N760 Acute vaginitis: Secondary | ICD-10-CM

## 2023-02-02 MED ORDER — METRONIDAZOLE 500 MG PO TABS
500.0000 mg | ORAL_TABLET | Freq: Two times a day (BID) | ORAL | 0 refills | Status: DC
Start: 2023-02-02 — End: 2023-11-02

## 2023-02-02 MED ORDER — VITAMIN D (ERGOCALCIFEROL) 1.25 MG (50000 UNIT) PO CAPS: 50000.0000 [IU] | ORAL_CAPSULE | ORAL | 0 refills | Status: AC

## 2023-02-07 ENCOUNTER — Encounter: Payer: Self-pay | Admitting: Obstetrics and Gynecology

## 2023-02-07 LAB — CYTOLOGY - PAP
Chlamydia: NEGATIVE
Comment: NEGATIVE
Comment: NEGATIVE
Comment: NEGATIVE
Comment: NORMAL
Diagnosis: NEGATIVE
High risk HPV: NEGATIVE
Neisseria Gonorrhea: NEGATIVE
Trichomonas: NEGATIVE

## 2023-03-03 ENCOUNTER — Other Ambulatory Visit: Payer: Self-pay | Admitting: Nurse Practitioner

## 2023-03-03 DIAGNOSIS — K219 Gastro-esophageal reflux disease without esophagitis: Secondary | ICD-10-CM

## 2023-03-03 NOTE — Telephone Encounter (Signed)
Med refill request: pepcid Last AEX: 01/31/23 w/ BS Next AEX: 02/06/24 Last MMG (if hormonal med) n/a Refill authorized: Please Advise, #180, 0 RF

## 2023-03-07 ENCOUNTER — Other Ambulatory Visit: Payer: Self-pay | Admitting: Nurse Practitioner

## 2023-03-07 DIAGNOSIS — K219 Gastro-esophageal reflux disease without esophagitis: Secondary | ICD-10-CM

## 2023-03-07 NOTE — Telephone Encounter (Signed)
 Med refill request: Prilosec 20mg  Last AEX: 01/31/23 Next AEX: 02/06/24 Last MMG (if hormonal med) n/a  Refill authorized: Last rx 12/07/22 #90 with 0 refills. Please approve or deny as appropriate.

## 2023-03-07 NOTE — Telephone Encounter (Signed)
 Called and spoke with patient and advised her to f/u with her PCP for any further issues with this.   Pt also stated was suppose to get a referral to dermatologist. Hasn't heard anything back, wanted to follow up with [place] to see if we knew/had any other information about progress on referral.

## 2023-03-29 ENCOUNTER — Inpatient Hospital Stay (HOSPITAL_COMMUNITY)
Admission: AD | Admit: 2023-03-29 | Discharge: 2023-03-29 | Discharge status: Against Medical Advice | Attending: Obstetrics and Gynecology | Admitting: Obstetrics and Gynecology

## 2023-03-29 DIAGNOSIS — Z5321 Procedure and treatment not carried out due to patient leaving prior to being seen by health care provider: Secondary | ICD-10-CM | POA: Diagnosis not present

## 2023-03-29 DIAGNOSIS — Z3202 Encounter for pregnancy test, result negative: Secondary | ICD-10-CM | POA: Insufficient documentation

## 2023-03-29 LAB — POCT PREGNANCY, URINE: Preg Test, Ur: NEGATIVE

## 2023-03-29 NOTE — MAU Note (Signed)
 Called pt for Triage but admission personnel stated pt had decided to go home

## 2023-04-28 ENCOUNTER — Emergency Department (HOSPITAL_BASED_OUTPATIENT_CLINIC_OR_DEPARTMENT_OTHER)

## 2023-04-28 ENCOUNTER — Other Ambulatory Visit: Payer: Self-pay

## 2023-04-28 ENCOUNTER — Encounter (HOSPITAL_BASED_OUTPATIENT_CLINIC_OR_DEPARTMENT_OTHER): Payer: Self-pay | Admitting: Emergency Medicine

## 2023-04-28 ENCOUNTER — Emergency Department (HOSPITAL_BASED_OUTPATIENT_CLINIC_OR_DEPARTMENT_OTHER)
Admission: EM | Admit: 2023-04-28 | Discharge: 2023-04-28 | Disposition: A | Discharge status: Home / Self Care | Attending: Emergency Medicine | Admitting: Emergency Medicine

## 2023-04-28 DIAGNOSIS — S1010XA Unspecified superficial injuries of throat, initial encounter: Secondary | ICD-10-CM | POA: Diagnosis present

## 2023-04-28 DIAGNOSIS — S1081XA Abrasion of other specified part of neck, initial encounter: Secondary | ICD-10-CM | POA: Diagnosis not present

## 2023-04-28 DIAGNOSIS — S1011XA Abrasion of throat, initial encounter: Secondary | ICD-10-CM | POA: Diagnosis not present

## 2023-04-28 DIAGNOSIS — T71193A Asphyxiation due to mechanical threat to breathing due to other causes, assault, initial encounter: Secondary | ICD-10-CM

## 2023-04-28 LAB — BASIC METABOLIC PANEL WITH GFR
Anion gap: 8 (ref 5–15)
BUN: 9 mg/dL (ref 6–20)
CO2: 27 mmol/L (ref 22–32)
Calcium: 9.2 mg/dL (ref 8.9–10.3)
Chloride: 105 mmol/L (ref 98–111)
Creatinine, Ser: 0.74 mg/dL (ref 0.44–1.00)
GFR, Estimated: >60 mL/min (ref >60)
Glucose, Bld: 83 mg/dL (ref 70–99)
Potassium: 3.5 mmol/L (ref 3.5–5.1)
Sodium: 140 mmol/L (ref 135–145)

## 2023-04-28 LAB — CBC
HCT: 35.6 % — ABNORMAL LOW (ref 36.0–46.0)
Hemoglobin: 11.5 g/dL — ABNORMAL LOW (ref 12.0–15.0)
MCH: 26.3 pg (ref 26.0–34.0)
MCHC: 32.3 g/dL (ref 30.0–36.0)
MCV: 81.3 fL (ref 80.0–100.0)
Platelets: 324 10*3/uL (ref 150–400)
RBC: 4.38 MIL/uL (ref 3.87–5.11)
RDW: 14.6 % (ref 11.5–15.5)
WBC: 6.4 10*3/uL (ref 4.0–10.5)
nRBC: 0 % (ref 0.0–0.2)

## 2023-04-28 MED ORDER — IOHEXOL 350 MG/ML SOLN
100.0000 mL | Freq: Once | INTRAVENOUS | Status: AC | PRN
  Administered 2023-04-28: 75 mL via INTRAVENOUS

## 2023-04-28 NOTE — Discharge Instructions (Addendum)
 Your CT scan today did not show any abnormalities.  Your pain should slowly improve over the next couple of days.  I have included your CT scan results below for your reference.  You may take up to 1000mg  of tylenol every 6 hours as needed for pain.  Do not take more then 4g per day.  You may use up to 600mg  ibuprofen every 6 hours as needed for pain.  Do not exceed 2.4g of ibuprofen per day.  You had a slightly low hemoglobin here which is one of your blood counts.  Please have your PCP recheck this value within the next month.  This could be an indication of an iron deficiency.  Please follow-up with your PCP for recheck of your symptoms within the next week.  Return to the ER for any difficulty breathing, unilateral weakness, slurred speech, facial droop, any other new or concerning symptoms.  " EXAM:  CT ANGIOGRAPHY NECK    TECHNIQUE:  Multidetector CT imaging of the neck was performed using the  standard protocol during bolus administration of intravenous  contrast. Multiplanar CT image reconstructions and MIPs were  obtained to evaluate the vascular anatomy. Carotid stenosis  measurements (when applicable) are obtained utilizing NASCET  criteria, using the distal internal carotid diameter as the  denominator.    RADIATION DOSE REDUCTION: This exam was performed according to the  departmental dose-optimization program which includes automated  exposure control, adjustment of the mA and/or kV according to  patient size and/or use of iterative reconstruction technique.    CONTRAST:  75mL OMNIPAQUE IOHEXOL 350 MG/ML SOLN    COMPARISON:  None Available.    FINDINGS:  CTA NECK    Skeleton: Reversal of the normal cervical lordosis but no osseous  abnormality identified. Hyoid bone appears intact.    Upper chest: Low lung volumes with atelectasis. Negative visible  mediastinum, heart size.    Other neck: Glottis is closed. Thyroid, larynx, pharynx,  parapharyngeal spaces,  retropharyngeal space, sublingual space,  submandibular spaces, masticator, parotid spaces, orbits soft  tissues are within normal limits. No evidence of intracranial mass  effect or ventriculomegaly.    Aortic arch: Normal 3 vessel arch.    Right carotid system: Normal.    Left carotid system: Normal.    Vertebral arteries:  Patent and within normal limits, the left vertebral artery appears  mildly dominant.    LIMITED CTA HEAD    Posterior circulation: Dominant left V4 segment with patent distal  vertebral arteries, vertebrobasilar junction, basilar artery, SCA  and PCA origins. Diminutive posterior communicating arteries.  Bilateral PCA branches are within normal limits.    Anterior circulation: Both ICA siphons are patent and within normal  limits. Carotid termini, MCA and ACA origins appear patent and  normal. Anterior communicating artery, visible ACA and MCA branches  are within normal limits.    Anatomic variants: Dominant left vertebral artery.    Review of the MIP images confirms the above findings    IMPRESSION:  Normal CTA with no traumatic injury identified in the Neck.      Electronically Signed    By: Marlise Simpers M.D.    On: 04/28/2023 11:03  "
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
## 2023-04-28 NOTE — ED Notes (Signed)
 Discharge paperwork given and verbally understood.
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
## 2023-05-03 ENCOUNTER — Other Ambulatory Visit: Payer: Medicaid Other

## 2023-05-23 ENCOUNTER — Other Ambulatory Visit

## 2023-05-23 DIAGNOSIS — R7989 Other specified abnormal findings of blood chemistry: Secondary | ICD-10-CM

## 2023-05-24 ENCOUNTER — Ambulatory Visit: Payer: Self-pay | Admitting: Obstetrics and Gynecology

## 2023-05-24 LAB — VITAMIN D 25 HYDROXY (VIT D DEFICIENCY, FRACTURES): Vit D, 25-Hydroxy: 37 ng/mL (ref 30–100)

## 2023-07-13 ENCOUNTER — Ambulatory Visit: Payer: Self-pay

## 2023-07-13 ENCOUNTER — Ambulatory Visit
Admission: RE | Admit: 2023-07-13 | Discharge: 2023-07-13 | Disposition: A | Discharge status: Home / Self Care | Source: Ambulatory Visit | Attending: Physician Assistant | Admitting: Physician Assistant

## 2023-07-13 VITALS — BP 116/77 | HR 80 | Temp 97.9°F | Resp 16

## 2023-07-13 DIAGNOSIS — Z113 Encounter for screening for infections with a predominantly sexual mode of transmission: Secondary | ICD-10-CM | POA: Diagnosis present

## 2023-07-13 DIAGNOSIS — R102 Pelvic and perineal pain: Secondary | ICD-10-CM | POA: Diagnosis present

## 2023-07-13 DIAGNOSIS — N898 Other specified noninflammatory disorders of vagina: Secondary | ICD-10-CM | POA: Diagnosis present

## 2023-07-13 LAB — POCT URINALYSIS DIP (MANUAL ENTRY)
Bilirubin, UA: NEGATIVE
Blood, UA: NEGATIVE
Glucose, UA: NEGATIVE mg/dL
Ketones, POC UA: NEGATIVE mg/dL
Leukocytes, UA: NEGATIVE
Nitrite, UA: NEGATIVE
Protein Ur, POC: NEGATIVE mg/dL
Spec Grav, UA: >=1.03 — AB (ref 1.010–1.025)
Urobilinogen, UA: 0.2 U/dL
pH, UA: 6 (ref 5.0–8.0)

## 2023-07-13 LAB — POCT URINE PREGNANCY: Preg Test, Ur: NEGATIVE

## 2023-07-13 MED ORDER — FLUCONAZOLE 150 MG PO TABS: 150.0000 mg | ORAL_TABLET | ORAL | 0 refills | Status: DC | PRN

## 2023-07-13 NOTE — ED Provider Notes (Signed)
 [facility]    CSN: [id number] Arrival date & time: 07/13/23  1035      History   Chief Complaint Chief Complaint  Patient presents with   Vaginal Discharge    RN Triage - Entered by patient    HPI Jean Solis is a 28 y.o. female.   HPI Pt reports concerns for vaginal discharge with odor as well as vulvovaginal irritation since Sunday  She reports some concern for potential STD exposure and is amenable to getting blood work too She also reports suprapubic pressure   She denies concerns for pregnancy    Past Medical History:  Diagnosis Date   Anxiety    Chlamydia 09/2011   Depression    on meds, emotions up and down   Headache    Low vitamin D  level    STD (sexually transmitted disease)    Umbilical hernia    dx with prev preg   Vaginal Pap smear, abnormal    +in last preg, had colpo- they were talking about freezing the cells    Patient Active Problem List   Diagnosis Date Noted   Allergic rhinitis 05/12/2022   ASCUS with positive high risk HPV cervical 07/29/2020   Anxiety 04/21/2020   Alpha thalassemia silent carrier 09/27/2019   Genital herpes 03/01/2018   IUD (intrauterine device) in place 12/07/2017   Pre-diabetes 07/20/2017   Eczema 12/27/2011    Past Surgical History:  Procedure Laterality Date   BARTHOLIN CYST MARSUPIALIZATION Right 2012   LEEP  06/25/2022   CIN II   TONSILLECTOMY      OB History     Gravida  4   Para  3   Term  3   Preterm  0   AB  1   Living  3      SAB  0   IAB  1   Ectopic  0   Multiple  0   Live Births  3            Home Medications    Prior to Admission medications   Medication Sig Start Date End Date Taking? Authorizing Provider  fluconazole  (DIFLUCAN ) 150 MG tablet Take 1 tablet (150 mg total) by mouth every three (3) days as needed. May repeat in 3 days if symptoms not resolved 07/13/23  Yes Juliene Kirsh E, PA-C  clindamycin  (CLEOCIN -T) 1 % lotion Apply topically 2 (two)  times daily. 01/31/23   Cathlyn JAYSON Nikki Bobie FORBES, MD  famotidine  (PEPCID ) 20 MG tablet TAKE 1 TABLET BY MOUTH TWICE A DAY 03/03/23   Glennon Almarie POUR, MD  metroNIDAZOLE  (FLAGYL ) 500 MG tablet Take 1 tablet (500 mg total) by mouth 2 (two) times daily. 02/02/23   Cathlyn JAYSON Nikki, Bobie FORBES, MD  omeprazole  (PRILOSEC) 20 MG capsule TAKE 1 CAPSULE BY MOUTH EVERY DAY 03/07/23   Amundson C Silva, Brook E, MD  valACYclovir  (VALTREX ) 500 MG tablet TAKE 1 TABLET DAILY, INCREASE TO 1 TABLET TWICE DAILY FOR 3 DAYS AS NEEDED 01/03/23   Glennon Almarie POUR, MD  Vitamin D , Ergocalciferol , (DRISDOL ) 1.25 MG (50000 UNIT) CAPS capsule Take 1 capsule (50,000 Units total) by mouth every 7 (seven) days. 02/02/23   Cathlyn JAYSON Nikki Bobie FORBES, MD    Family History Family History  Problem Relation Age of Onset   Hypertension Mother    Hyperlipidemia Mother    Thyroid disease Mother    Diabetes Maternal Uncle    Hypertension Maternal Grandmother  Social History Social History   Tobacco Use   Smoking status: Never   Smokeless tobacco: Never  Vaping Use   Vaping status: Never Used  Substance Use Topics   Alcohol use: Not Currently    Comment: occasionally.   Drug use: No     Allergies   Patient has no known allergies.   Review of Systems Review of Systems  Constitutional:  Negative for chills and fever.  Gastrointestinal:  Positive for abdominal pain.  Genitourinary:  Positive for vaginal discharge. Negative for dysuria, flank pain, frequency, genital sores, pelvic pain, vaginal bleeding and vaginal pain.     Physical Exam Triage Vital Signs ED Triage Vitals  Encounter Vitals Group     BP 07/13/23 1042 116/77     Girls Systolic BP Percentile --      Girls Diastolic BP Percentile --      Boys Systolic BP Percentile --      Boys Diastolic BP Percentile --      Pulse Rate 07/13/23 1042 80     Resp 07/13/23 1042 16     Temp 07/13/23 1042 97.9 F (36.6 C)     Temp Source 07/13/23 1042  Oral     SpO2 07/13/23 1042 96 %     Weight --      Height --      Head Circumference --      Peak Flow --      Pain Score 07/13/23 1045 0     Pain Loc --      Pain Education --      Exclude from Growth Chart --    No data found.  Updated Vital Signs BP 116/77 (BP Location: Right Arm)   Pulse 80   Temp 97.9 F (36.6 C) (Oral)   Resp 16   LMP 05/13/2023 (Approximate)   SpO2 96%   Breastfeeding No   Visual Acuity Right Eye Distance:   Left Eye Distance:   Bilateral Distance:    Right Eye Near:   Left Eye Near:    Bilateral Near:     Physical Exam Vitals reviewed.  Constitutional:      General: She is awake.     Appearance: Normal appearance. She is well-developed and well-groomed.  HENT:     Head: Normocephalic and atraumatic.  Eyes:     General: Lids are normal. Gaze aligned appropriately.     Extraocular Movements: Extraocular movements intact.     Conjunctiva/sclera: Conjunctivae normal.  Pulmonary:     Effort: Pulmonary effort is normal.  Neurological:     Mental Status: She is alert and oriented to person, place, and time.  Psychiatric:        Attention and Perception: Attention and perception normal.        Mood and Affect: Mood and affect normal.        Speech: Speech normal.        Behavior: Behavior normal. Behavior is cooperative.      UC Treatments / Results  Labs (all labs ordered are listed, but only abnormal results are displayed) Labs Reviewed  POCT URINALYSIS DIP (MANUAL ENTRY) - Abnormal; Notable for the following components:      Result Value   Clarity, UA turbid (*)    Spec Grav, UA >=1.030 (*)    All other components within normal limits  RPR  HIV ANTIBODY (ROUTINE TESTING W REFLEX)  POCT URINE PREGNANCY  CERVICOVAGINAL ANCILLARY ONLY    EKG   Radiology No  results found.  Procedures Procedures (including critical care time)  Medications Ordered in UC Medications - No data to display  Initial Impression / Assessment  and Plan / UC Course  I have reviewed the triage vital signs and the nursing notes.  Pertinent labs & imaging results that were available during my care of the patient were reviewed by me and considered in my medical decision making (see chart for details).      Final Clinical Impressions(s) / UC Diagnoses   Final diagnoses:  Screening examination for STD (sexually transmitted disease)  Vaginal discharge  Vulvovaginal discomfort   Pt presents today with concerns for vaginal discharge and vulvovaginal discomfort for the past 3 days. Urine dip is negative for signs of UTI or hematuria. Urine preg is negative. Cervicovaginal swab collected to assess for BV, trich, yeast, gonorrhea, chlamydia. Will get bloodwork to assess for HIV and syphilis. Given patient's symptoms will send in script for Diflucan  for suspected yeast infection. Results of testing to dictate further management. Follow up as needed for persistent or progressing symptoms       Discharge Instructions      You urine dip analysis did not show evidence of UTI or blood in the urine. Your urine pregnancy test was negative You were seen today for STD screening.  We collected a cervicovaginal swab that we will assess for gonorrhea, chlamydia, trichomonas, bacterial vaginosis, yeast.  If collected blood work that we will assess for HIV and syphilis.  We will keep you updated with these results once they are available.  If any medications are indicated by those test results we will call you and medications will either be sent to the pharmacy on file or you can return to the urgent care for an injection.  It is recommended that you refrain from sexual activity until your test results are negative or until you have completed an appropriate medication regimen as dictated by your test results.  Please use a condom or another barrier method to help prevent STD transmission.  Please make sure that you communicate with your partners regarding  your test results should any positive results, about as they will also need to be tested and screened.      ED Prescriptions     Medication Sig Dispense Auth. Provider   fluconazole  (DIFLUCAN ) 150 MG tablet Take 1 tablet (150 mg total) by mouth every three (3) days as needed. May repeat in 3 days if symptoms not resolved 2 tablet Kathye Cipriani E, PA-C      PDMP not reviewed this encounter.   Marylene Rocky BRAVO, PA-C 07/13/23 1143

## 2023-07-13 NOTE — ED Triage Notes (Signed)
 Pt states white vaginal discharge with odor and irritation for the past 3 days.

## 2023-07-13 NOTE — Telephone Encounter (Signed)
  Copied from CRM [id number]. Topic: Clinical - Red Word Triage >> Jul 13, 2023  9:54 AM Adelita E wrote: [facility] that prompted transfer to Nurse Triage: Patient has been having yellow-tinted vaginal discharge and a burning sensation going on for a few days. Also noticed that she is a little swollen and that area is itchy. Reason for Disposition  Bad smelling vaginal discharge  Answer Assessment - Initial Assessment Questions 1. DISCHARGE: Describe the discharge. (e.g., white, yellow, green, gray, foamy, cottage cheese-like)     ---- yellow tinted    2. ODOR: Is there a bad odor?     -------  Yes    3. ONSET: When did the discharge begin?     ----------------- Sunday    4. RASH: Is there a rash in the genital area? If Yes, ask: Describe it. (e.g., redness, blisters, sores, bumps)     -------------- Denies    5. ABDOMEN PAIN: Are you having any abdomen pain? If Yes, ask: What does it feel like?  (e.g., crampy, dull, intermittent, constant)      ----- Uterus is throbbing.. feels like pressure   6. ABDOMEN PAIN SEVERITY: If present, ask: How bad is it? (e.g., Scale 1-10; mild, moderate, or severe)   - MILD (1-3): Doesn't interfere with normal activities, abdomen soft and not tender to touch.    - MODERATE (4-7): Interferes with normal activities or awakens from sleep, abdomen tender to touch.    - SEVERE (8-10): Excruciating pain, doubled over, unable to do any normal activities. (R/O peritonitis)      ---- Denies pain. Just pressure    7. CAUSE: What do you think is causing the discharge? Have you had the same problem before? What happened then?     ----------- Unsure    8. OTHER SYMPTOMS: Do you have any other symptoms? (e.g., fever, itching, vaginal bleeding, pain with urination, injury to genital area, vaginal foreign body)     ---- Vaginal dryness  ------- Labia irritated   9. PREGNANCY: Is there any chance you are pregnant? When was your last  menstrual period?     -- Denies  --- Confirms Sexually active within the last couple of weeks.     Additional Information:  No available appt within dispo timeframe. Scheduled UC appt appropriately 07/13/23 1030am  Verbalized understanding. No additional questions/concerns noted during the time of the call.  Protocols used: Vaginal Discharge-A-AH

## 2023-07-13 NOTE — Discharge Instructions (Signed)
 You urine dip analysis did not show evidence of UTI or blood in the urine. Your urine pregnancy test was negative You were seen today for STD screening.  We collected a cervicovaginal swab that we will assess for gonorrhea, chlamydia, trichomonas, bacterial vaginosis, yeast.  If collected blood work that we will assess for HIV and syphilis.  We will keep you updated with these results once they are available.  If any medications are indicated by those test results we will call you and medications will either be sent to the pharmacy on file or you can return to the urgent care for an injection.  It is recommended that you refrain from sexual activity until your test results are negative or until you have completed an appropriate medication regimen as dictated by your test results.  Please use a condom or another barrier method to help prevent STD transmission.  Please make sure that you communicate with your partners regarding your test results should any positive results, about as they will also need to be tested and screened.

## 2023-07-14 LAB — CERVICOVAGINAL ANCILLARY ONLY
Bacterial Vaginitis (gardnerella): NEGATIVE
Candida Glabrata: NEGATIVE
Candida Vaginitis: POSITIVE — AB
Chlamydia: NEGATIVE
Comment: NEGATIVE
Comment: NEGATIVE
Comment: NEGATIVE
Comment: NEGATIVE
Comment: NEGATIVE
Comment: NORMAL
Neisseria Gonorrhea: NEGATIVE
Trichomonas: NEGATIVE

## 2023-07-14 LAB — HIV ANTIBODY (ROUTINE TESTING W REFLEX): HIV Screen 4th Generation wRfx: NONREACTIVE

## 2023-07-14 LAB — RPR: RPR Ser Ql: NONREACTIVE

## 2023-07-15 ENCOUNTER — Ambulatory Visit (HOSPITAL_COMMUNITY): Payer: Self-pay

## 2023-07-19 ENCOUNTER — Ambulatory Visit: Admitting: Obstetrics and Gynecology

## 2023-08-02 ENCOUNTER — Ambulatory Visit: Admitting: Family

## 2023-09-19 ENCOUNTER — Ambulatory Visit: Admitting: Family

## 2023-10-31 ENCOUNTER — Encounter: Payer: Self-pay | Admitting: Physician Assistant

## 2023-10-31 ENCOUNTER — Ambulatory Visit: Admitting: Physician Assistant

## 2023-10-31 VITALS — BP 148/93

## 2023-10-31 DIAGNOSIS — L700 Acne vulgaris: Secondary | ICD-10-CM

## 2023-10-31 DIAGNOSIS — L732 Hidradenitis suppurativa: Secondary | ICD-10-CM | POA: Diagnosis not present

## 2023-10-31 MED ORDER — CLINDAMYCIN PHOSPHATE 1 % EX LOTN: TOPICAL_LOTION | Freq: Two times a day (BID) | CUTANEOUS | 5 refills | Status: AC

## 2023-10-31 MED ORDER — DOXYCYCLINE HYCLATE 100 MG PO TABS: 100.0000 mg | ORAL_TABLET | Freq: Every day | ORAL | 1 refills | Status: AC

## 2023-10-31 MED ORDER — TRETINOIN 0.025 % EX CREA: TOPICAL_CREAM | CUTANEOUS | 1 refills | Status: AC

## 2023-10-31 MED ORDER — DOXYCYCLINE HYCLATE 100 MG PO TABS: 100.0000 mg | ORAL_TABLET | Freq: Every day | ORAL | 1 refills | Status: DC

## 2023-10-31 NOTE — Progress Notes (Signed)
   New Patient Visit   Subjective  Jean Solis is a 28 y.o. female who presents for a NEW PATIENT appointment to be examined for the concerns as listed below.   Hidradenitis suppurativa:  Pt was Dx 2 months ago while flared at the axillae, her PCP Rx clindamycin  lotion to apply to the areas daily. She has not tried any Rx oral medications and she currently washes with Dove sensitive skin body wash. Pt is not flared today but would like a better maintenance regimen.   Other concern: Acne on face   Are you nursing, pregnant or trying to conceive? No   No Hx of Bx.  No family Hx of skin cancer.   The following portions of the chart were reviewed this encounter and updated as appropriate: medications, allergies, medical history  Review of Systems:  No other skin or systemic complaints except as noted in HPI or Assessment and Plan.  Objective  Well appearing patient in no apparent distress; mood and affect are within normal limits.   A focused examination was performed of the following areas: axillae (patient declined other areas to be examined today as she is not flaring)   Relevant exam findings are noted in the Assessment and Plan.    Assessment & Plan   HIDRADENITIS SUPPURATIVA - AXILLAE/GROIN Exam: cysts with ropy scarring under the B/L axilla   Treatment Plan: - Rec washing areas with alternating Panoxyl BPO, Dial abx soap & Hibiclens - Continue applying clindamycin  to affected areas daily. Refills sent.  - Briefly discussed systemic medications to treat HS but at her stage both she and I agreed that it is unnecessary at this time.    ACNE VULGARIS-- FACE  Exam: Open comedones and inflammatory papules  flared  Treatment Plan: - Rx doxycycline  100mg  - take daily for 2 months. Take with heavy meal to prevent stomach upset.  - Rx tretinoin 0.025% - apply 1gm (pea size) amount to face every other night.  - BP wash in am and gentle facial cleanser at  night   ACNE VULGARIS   Related Medications tretinoin (RETIN-A) 0.025 % cream apply 1gm (pea size) amount to face every other night. doxycycline  (VIBRA -TABS) 100 MG tablet Take 1 tablet (100 mg total) by mouth daily. Take with heavy meal to prevent stomach upset. HIDRADENITIS SUPPURATIVA    Return in about 3 months (around 01/31/2024) for acne  and HS .   Documentation: I have reviewed the above documentation for accuracy and completeness, and I agree with the above.  I, Shirron Maranda, CMA, am acting as Neurosurgeon for [employer]. Anaya Bovee, PA-C Jan Olano K, PA-C

## 2023-10-31 NOTE — Patient Instructions (Addendum)
 HIDRADENITIS SUPPURATIVA Exam: inflamed cyst with ropy scarring under the B/L axilla [place] Stage 2: single or multiple, widely separated recurrent abscesses with sinus tract formation and scarring   flared   Treatment Plan: - Rec washing areas with alternating Panoxyl BPO, Dial abx soap & Hibiclens - Continue applying clindamycin  to affected areas daily. Refills sent.      ACNE VULGARIS Exam: Open comedones and inflammatory papules   flared   Treatment Plan: - Rx doxycycline  100mg  - take daily for 2 months. Take with heavy meal to prevent stomach upset.  - Rx tretinoin 0.025% - apply 1gm (pea size) amount to face every other night.

## 2023-11-02 ENCOUNTER — Other Ambulatory Visit (HOSPITAL_COMMUNITY)
Admission: RE | Admit: 2023-11-02 | Discharge: 2023-11-02 | Disposition: A | Discharge status: Home / Self Care | Source: Ambulatory Visit | Attending: Family | Admitting: Family

## 2023-11-02 ENCOUNTER — Ambulatory Visit: Admitting: Family

## 2023-11-02 ENCOUNTER — Encounter: Payer: Self-pay | Admitting: Family

## 2023-11-02 VITALS — BP 112/78 | HR 89 | Temp 98.2°F | Resp 16 | Ht 67.0 in | Wt 247.2 lb

## 2023-11-02 DIAGNOSIS — E66812 Obesity, class 2: Secondary | ICD-10-CM

## 2023-11-02 DIAGNOSIS — E6609 Other obesity due to excess calories: Secondary | ICD-10-CM

## 2023-11-02 DIAGNOSIS — Z113 Encounter for screening for infections with a predominantly sexual mode of transmission: Secondary | ICD-10-CM | POA: Insufficient documentation

## 2023-11-02 DIAGNOSIS — Z6838 Body mass index (BMI) 38.0-38.9, adult: Secondary | ICD-10-CM

## 2023-11-02 DIAGNOSIS — A601 Herpesviral infection of perianal skin and rectum: Secondary | ICD-10-CM | POA: Diagnosis not present

## 2023-11-02 DIAGNOSIS — N898 Other specified noninflammatory disorders of vagina: Secondary | ICD-10-CM

## 2023-11-02 NOTE — Patient Instructions (Signed)
 Welcome to [facility], It was a pleasure meeting you today!    [email or web address], [email or web address], or [email or web address], and you can go to these websites to learn more. All 3 are about the same cost & have mail order options, you pay for med online and I send the RX. Or sometimes you can get a good price at a [facility] outpatient pharmacy. But the price does go up as the dose increases. Schedule a visit if you would like to start one of these. Additionally, I do always recommend a referral to a nutritionist or our medical weight loss clinic.   Please schedule a physical with fasting labs in one month along and we can combine with your follow up visit for weight loss.  Start working on what we discussed today regarding your weight loss goals:  1) Eat small portions, ok to eat 6 mini meals vs. 3 big meals if this controls your hunger  better. 2) Eat until full and then STOP! This is your body telling you it has had enough. 3) Drink at least 64 oz water = 2 liters = 8, 8oz cups DAILY. 4) Eat most of your calories earlier in the day (if you work during the day).  Want to eat within a 8-10hour window if possible. No eating after 7pm, only no calorie drinks or water from 7p until bedtime. 5) Look for low carb, high protein foods/recipes. Avoid simple carbs including sweets, white bread, rice, potatoes, pasta. Look for whole grain/whole wheat/or almond flour if eating these foods. 6) High protein foods: meats (limit red meat), including [place], chicken, pork, FISH (best source) nuts, cheese, beans (black beans, soy/edamame beans are best). Protein drinks, bars are also good but must have low sugar, look for < 10 grams. 7) Avoid processed/refined sugar and artificial sweeteners if possible. Stevia, Erythritol, or Monk fruit sweetener are preferred, natural, no calorie sweeteners.  Natural sweeteners like Agave or honey in very small amounts are ok also.    PLEASE NOTE: If you had any LAB  tests please let us  know if you have not heard back within a few days. You may see your results on MyChart before we have a chance to review them but we will give you a call once they are reviewed by us . If we ordered any REFERRALS today, please let us  know if you have not heard from their office within the next week.  Let us  know through MyChart if you are needing REFILLS, or have your pharmacy send us  the request. You can also use MyChart to communicate with me or any office staff.

## 2023-11-02 NOTE — Progress Notes (Unsigned)
 New Patient Office Visit  Subjective:  Patient ID: Jean Solis, female    DOB: 1995-09-11  Age: 28 y.o. MRN: [id number]  CC:  Chief Complaint  Patient presents with  . Establish Care    Expressed that she was a pt. Here years ago. Wants to establish care again. .  . Hidradenitis suppurativa    Pt would like to discuss. Seen by derm on 10/20 and was given tretinoin 0.025% and Doxycycline  100mg .  . Obesity    HPI Jean Solis presents for establishing care today.  Assessment & Plan:  Screening examination for STD (sexually transmitted disease) -     Urine cytology ancillary only  Vaginal discharge -     Cervicovaginal ancillary only    Subjective:    Outpatient Medications Prior to Visit  Medication Sig Dispense Refill  . clindamycin  (CLEOCIN -T) 1 % lotion Apply topically 2 (two) times daily. 60 mL 5  . doxycycline  (VIBRA -TABS) 100 MG tablet Take 1 tablet (100 mg total) by mouth daily. Take with heavy meal to prevent stomach upset. 30 tablet 1  . famotidine  (PEPCID ) 20 MG tablet TAKE 1 TABLET BY MOUTH TWICE A DAY 180 tablet 0  . omeprazole  (PRILOSEC) 20 MG capsule TAKE 1 CAPSULE BY MOUTH EVERY DAY 30 capsule 0  . tretinoin (RETIN-A) 0.025 % cream apply 1gm (pea size) amount to face every other night. 45 g 1  . valACYclovir  (VALTREX ) 500 MG tablet TAKE 1 TABLET DAILY, INCREASE TO 1 TABLET TWICE DAILY FOR 3 DAYS AS NEEDED 180 tablet 1  . Vitamin D , Ergocalciferol , (DRISDOL ) 1.25 MG (50000 UNIT) CAPS capsule Take 1 capsule (50,000 Units total) by mouth every 7 (seven) days. 12 capsule 0  . fluconazole  (DIFLUCAN ) 150 MG tablet Take 1 tablet (150 mg total) by mouth every three (3) days as needed. May repeat in 3 days if symptoms not resolved (Patient not taking: Reported on 11/02/2023) 2 tablet 0  . metroNIDAZOLE  (FLAGYL ) 500 MG tablet Take 1 tablet (500 mg total) by mouth 2 (two) times daily. (Patient not taking: Reported on 11/02/2023) 14 tablet 0   No  facility-administered medications prior to visit.   Past Medical History:  Diagnosis Date  . Anxiety   . Chlamydia 09/2011  . Depression    on meds, emotions up and down  . Headache   . Low vitamin D  level   . STD (sexually transmitted disease)   . Umbilical hernia    dx with prev preg  . Vaginal Pap smear, abnormal    +in last preg, had colpo- they were talking about freezing the cells   Past Surgical History:  Procedure Laterality Date  . BARTHOLIN CYST MARSUPIALIZATION Right 2012  . LEEP  06/25/2022   CIN II  . TONSILLECTOMY      Objective:   Today's Vitals: BP 112/78   Pulse 89   Temp 98.2 F (36.8 C) (Temporal)   Resp 16   Ht 5' 7 (1.702 m)   Wt 247 lb 3.2 oz (112.1 kg)   LMP 09/24/2023 (Approximate)   SpO2 97%   BMI 38.72 kg/m   Physical Exam Vitals and nursing note reviewed.  Constitutional:      Appearance: Normal appearance. She is obese.  Cardiovascular:     Rate and Rhythm: Normal rate and regular rhythm.  Pulmonary:     Effort: Pulmonary effort is normal.     Breath sounds: Normal breath sounds.  Musculoskeletal:  General: Normal range of motion.  Skin:    General: Skin is warm and dry.  Neurological:     Mental Status: She is alert.  Psychiatric:        Mood and Affect: Mood normal.        Behavior: Behavior normal.     No orders of the defined types were placed in this encounter.   Lucius Krabbe, NP

## 2023-11-03 DIAGNOSIS — E6609 Other obesity due to excess calories: Secondary | ICD-10-CM | POA: Insufficient documentation

## 2023-11-03 LAB — URINE CYTOLOGY ANCILLARY ONLY
Chlamydia: NEGATIVE
Comment: NEGATIVE
Comment: NEGATIVE
Comment: NORMAL
Neisseria Gonorrhea: NEGATIVE
Trichomonas: NEGATIVE

## 2023-11-04 LAB — CERVICOVAGINAL ANCILLARY ONLY
Bacterial Vaginitis (gardnerella): POSITIVE — AB
Candida Glabrata: NEGATIVE
Candida Vaginitis: NEGATIVE
Comment: NEGATIVE
Comment: NEGATIVE
Comment: NEGATIVE

## 2023-11-07 ENCOUNTER — Ambulatory Visit: Payer: Self-pay | Admitting: Family

## 2023-11-07 DIAGNOSIS — B9689 Other specified bacterial agents as the cause of diseases classified elsewhere: Secondary | ICD-10-CM

## 2023-11-07 MED ORDER — METRONIDAZOLE 500 MG PO TABS: 500.0000 mg | ORAL_TABLET | Freq: Two times a day (BID) | ORAL | 0 refills | Status: AC

## 2024-02-01 ENCOUNTER — Encounter: Payer: Self-pay | Admitting: Physician Assistant

## 2024-02-01 ENCOUNTER — Ambulatory Visit
Admission: RE | Admit: 2024-02-01 | Discharge: 2024-02-01 | Disposition: A | Discharge status: Home / Self Care | Source: Ambulatory Visit | Attending: Student | Admitting: Student

## 2024-02-01 ENCOUNTER — Ambulatory Visit: Admitting: Physician Assistant

## 2024-02-01 VITALS — BP 122/81 | HR 92 | Temp 98.7°F | Resp 16

## 2024-02-01 VITALS — BP 129/87 | HR 97

## 2024-02-01 DIAGNOSIS — L732 Hidradenitis suppurativa: Secondary | ICD-10-CM

## 2024-02-01 DIAGNOSIS — L700 Acne vulgaris: Secondary | ICD-10-CM

## 2024-02-01 DIAGNOSIS — R141 Gas pain: Secondary | ICD-10-CM

## 2024-02-01 MED ORDER — SIMETHICONE 125 MG PO CAPS: 1.0000 | ORAL_CAPSULE | Freq: Three times a day (TID) | ORAL | 0 refills | Status: AC | PRN

## 2024-02-01 NOTE — Discharge Instructions (Addendum)
-   For the infection in the armpit (hidradenitis), continue the doxycycline  as prescribed by your dermatologist.  I also recommend warm compresses.  We typically do not drain these abscesses, because that will only make the hidradenitis worse. --->I also recommend calling your dermatologist, and asking if they recommend any change in your treatment.  - For your gas pain, I prescribed simethicone , which is a medication that can help with gas pain.  Drink plenty of water, and to keep your bowels moving. --->If your pain changes or worsens, like severe pain in one area, blood in your poop, blood in your vomit-seek additional immediate medical attention or go to the emergency department.

## 2024-02-01 NOTE — Progress Notes (Signed)
" ° °  Follow-Up Visit   Subjective  Jean Solis is a 29 y.o. female ESTABLISHED PATIENT who presents for the following: FOLLOW UP of Acne and HS. Pt reports boil has formed on right axillae approximately 4 days ago, she is currently alternating Hibiclens, panoxyl and dial and states it has helped. Pt is also using clindamycin  as needed for  HS flares. Pt reports minimal use of doxycycline  and uses tretinoin  every other day for acne.    The following portions of the chart were reviewed this encounter and updated as appropriate: medications, allergies, medical history  Review of Systems:  No other skin or systemic complaints except as noted in HPI or Assessment and Plan.  Objective  Well appearing patient in no apparent distress; mood and affect are within normal limits.  A focused examination was performed of the following areas: Right Underarm and Face  Relevant exam findings are noted in the Assessment and Plan.           Assessment & Plan   HIDRADENITIS SUPPURATIVA - AXILLAE/GROIN Exam: cysts with ropy scarring under the B/L axilla   Treatment Plan: - Rec washing areas with alternating Panoxyl BPO, Dial abx soap & Hibiclens - Continue applying clindamycin  to affected areas daily. Refills sent.  - Briefly discussed systemic medications vs surgery to treat HS. Pt would like to be evaluated for surgery. May consider Consentyx.  - Referral to General Surgery   ACNE VULGARIS- FACE  Exam: Open comedones and inflammatory papules   flared   Treatment Plan: - Rx doxycycline  100mg  - take daily for 2 months. Take with heavy meal to prevent stomach upset.  - Rx tretinoin  0.025% - apply 1gm (pea size) amount to face every other night.  - BP wash in am and gentle facial cleanser at night HIDRADENITIS   This Visit - Ambulatory referral to General Surgery ACNE VULGARIS   Existing Treatments - tretinoin  (RETIN-A ) 0.025 % cream - apply 1gm (pea size) amount to face every other  night.  Return in about 5 months (around 07/01/2024) for HS and Acne .  LILLETTE Virgle Boards, Mohs/Dermatology Tech am acting as a neurosurgeon for Kamee Bobst K, PA-C.  Documentation: I have reviewed the above documentation for accuracy and completeness, and I agree with the above.  Lumir Demetriou K, PA-C   "

## 2024-02-01 NOTE — Patient Instructions (Signed)

## 2024-02-01 NOTE — ED Provider Notes (Signed)
 " [facility]    CSN: [id number] Arrival date & time: 02/01/24  1104      History   Chief Complaint Chief Complaint  Patient presents with   Abdominal Pain    I believe I may have trapped gas and also a boil under my arm causing pain - Entered by patient    HPI Jean Solis is a 29 y.o. female presenting with abdominal pain and abscess.  Medical history GERD, umbilical hernia, hidradenitis. - Abdominal pain: Describes as pain in the lower abdominal with radiation to the rectal area, which feels like gas.  Symptoms for about 2 days.  Last bowel movement was today and was fairly normal. Denies recent diarrhea. Denies nausea, vomiting. Denies prior abd surgery. Pain was not relieved by BM. She is still passing gas. At its worst the pain was 8/10. Minimal pain at the time of this visit. - Also with abscess under the right axilla for 2 to 3 days. Denies drainage. She is already taking doxycycline  as prescribed  by her dermatologist for hidradenitis.   HPI  Past Medical History:  Diagnosis Date   Anxiety    Chlamydia 09/2011   Depression    on meds, emotions up and down   Headache    Low vitamin D  level    STD (sexually transmitted disease)    Umbilical hernia    dx with prev preg   Vaginal Pap smear, abnormal    +in last preg, had colpo- they were talking about freezing the cells    Patient Active Problem List   Diagnosis Date Noted   Class 2 obesity due to excess calories without serious comorbidity with body mass index (BMI) of 38.0 to 38.9 in adult 11/03/2023   Hidradenitis suppurativa 10/31/2023   Allergic rhinitis 05/12/2022   ASCUS with positive high risk HPV cervical 07/29/2020   Anxiety 04/21/2020   Alpha thalassemia silent carrier 09/27/2019   Genital herpes 03/01/2018   IUD (intrauterine device) in place 12/07/2017   Pre-diabetes 07/20/2017   Eczema 12/27/2011    Past Surgical History:  Procedure Laterality Date   BARTHOLIN CYST  MARSUPIALIZATION Right 2012   LEEP  06/25/2022   CIN II   TONSILLECTOMY      OB History     Gravida  4   Para  3   Term  3   Preterm  0   AB  1   Living  3      SAB  0   IAB  1   Ectopic  0   Multiple  0   Live Births  3            Home Medications    Prior to Admission medications  Medication Sig Start Date End Date Taking? Authorizing Provider  Simethicone  125 MG CAPS Take 1 capsule (125 mg total) by mouth 3 (three) times daily as needed. 02/01/24  Yes Nihal Doan E, PA-C  clindamycin  (CLEOCIN -T) 1 % lotion Apply topically 2 (two) times daily. 10/31/23   Sandridge, Brenda K, PA-C  famotidine  (PEPCID ) 20 MG tablet TAKE 1 TABLET BY MOUTH TWICE A DAY 03/03/23   Glennon Almarie POUR, MD  omeprazole  (PRILOSEC) 20 MG capsule TAKE 1 CAPSULE BY MOUTH EVERY DAY 03/07/23   Amundson C Silva, Brook E, MD  tretinoin  (RETIN-A ) 0.025 % cream apply 1gm (pea size) amount to face every other night. 10/31/23   Sandridge, Brenda K, PA-C  valACYclovir  (VALTREX ) 500 MG tablet TAKE 1 TABLET  DAILY, INCREASE TO 1 TABLET TWICE DAILY FOR 3 DAYS AS NEEDED 01/03/23   Glennon Almarie POUR, MD  Vitamin D , Ergocalciferol , (DRISDOL ) 1.25 MG (50000 UNIT) CAPS capsule Take 1 capsule (50,000 Units total) by mouth every 7 (seven) days. 02/02/23   Cathlyn JAYSON Nikki Bobie FORBES, MD    Family History Family History  Problem Relation Age of Onset   Hypertension Mother    Hyperlipidemia Mother    Thyroid disease Mother    Diabetes Maternal Uncle    Hypertension Maternal Grandmother     Social History Social History[1]   Allergies   Patient has no known allergies.   Review of Systems Review of Systems  Gastrointestinal:  Positive for abdominal pain.  Skin:        Abscess     Physical Exam Triage Vital Signs ED Triage Vitals  Encounter Vitals Group     BP 02/01/24 1110 122/81     Girls Systolic BP Percentile --      Girls Diastolic BP Percentile --      Boys Systolic BP Percentile  --      Boys Diastolic BP Percentile --      Pulse Rate 02/01/24 1110 92     Resp 02/01/24 1110 16     Temp 02/01/24 1110 98.7 F (37.1 C)     Temp Source 02/01/24 1110 Oral     SpO2 02/01/24 1110 97 %     Weight --      Height --      Head Circumference --      Peak Flow --      Pain Score 02/01/24 1114 4     Pain Loc --      Pain Education --      Exclude from Growth Chart --    No data found.  Updated Vital Signs BP 122/81 (BP Location: Right Arm)   Pulse 92   Temp 98.7 F (37.1 C) (Oral)   Resp 16   LMP 01/10/2024 (Approximate)   SpO2 97%   Visual Acuity Right Eye Distance:   Left Eye Distance:   Bilateral Distance:    Right Eye Near:   Left Eye Near:    Bilateral Near:     Physical Exam Vitals reviewed.  Constitutional:      General: She is not in acute distress.    Appearance: Normal appearance. She is not ill-appearing.  HENT:     Head: Normocephalic and atraumatic.     Mouth/Throat:     Mouth: Mucous membranes are moist.     Comments: Moist mucous membranes Eyes:     Extraocular Movements: Extraocular movements intact.     Pupils: Pupils are equal, round, and reactive to light.  Cardiovascular:     Rate and Rhythm: Normal rate and regular rhythm.     Heart sounds: Normal heart sounds.  Pulmonary:     Effort: Pulmonary effort is normal.     Breath sounds: Normal breath sounds. No wheezing, rhonchi or rales.  Abdominal:     General: Bowel sounds are increased. There is no distension.     Palpations: Abdomen is soft. There is no mass.     Tenderness: There is abdominal tenderness in the right lower quadrant and left lower quadrant. There is no right CVA tenderness, left CVA tenderness, guarding or rebound. Negative signs include Murphy's sign, Rovsing's sign and McBurney's sign.     Hernia: A hernia is present. Hernia is present in the umbilical area.  Comments: Bowel sounds increased.  She is very mildly tender to palpation in the lower quadrants,  without guarding or rebound.  She is comfortable throughout the exam.  There is a small 5 mm umbilical hernia, which is soft, nontender, and reducible.  Skin:    General: Skin is warm.     Capillary Refill: Capillary refill takes less than 2 seconds.     Comments: Good skin turgor  Right axilla: There is a small 8 mm pustule with fluctuance, and there are few small indurated pustules extending superiorly .  Tender to palpation.  No spontaneous drainage  Neurological:     General: No focal deficit present.     Mental Status: She is alert and oriented to person, place, and time.  Psychiatric:        Mood and Affect: Mood normal.        Behavior: Behavior normal.      UC Treatments / Results  Labs (all labs ordered are listed, but only abnormal results are displayed) Labs Reviewed - No data to display  EKG   Radiology No results found.  Procedures Procedures (including critical care time)  Medications Ordered in UC Medications - No data to display  Initial Impression / Assessment and Plan / UC Course  I have reviewed the triage vital signs and the nursing notes.  Pertinent labs & imaging results that were available during my care of the patient were reviewed by me and considered in my medical decision making (see chart for details).     Patient is a pleasant 29 y.o. female presenting with hidradenitis and gas pain. The patient is afebrile and nontachycardic.  Antipyretic has not been administered today.  Implant contraception.  On exam, she is having minimal abdominal pain, and she is comfortable throughout the visit.  She does have an umbilical hernia, but it is soft, nontender, and reducible.  I agree that gas pain is the most likely differential.  She understands that I cannot exclude other intra-abdominal pathology, including appendicitis, cholecystitis, gastritis, pancreatitis, diverticulitis etc.; so if her symptoms change or worsen, she should go to the emergency  department.  Trial of simethicone  and good hydration as below.  Regarding her right axillary abscess, this is a flare of her hidradenitis.  She is already taking doxycycline  100 mg twice daily as prescribed to dermatology.  I encouraged her to add daily warm compresses, and follow-up with dermatology.  We discussed that it is not recommended that I perform an I&D, as this will make the tract of infection worse.  The patient is in agreement.   Final Clinical Impressions(s) / UC Diagnoses   Final diagnoses:  Gas pain  Hidradenitis suppurativa     Discharge Instructions      - For the infection in the armpit (hidradenitis), continue the doxycycline  as prescribed by your dermatologist.  I also recommend warm compresses.  We typically do not drain these abscesses, because that will only make the hidradenitis worse. --->I also recommend calling your dermatologist, and asking if they recommend any change in your treatment.  - For your gas pain, I prescribed simethicone , which is a medication that can help with gas pain.  Drink plenty of water, and to keep your bowels moving. --->If your pain changes or worsens, like severe pain in one area, blood in your poop, blood in your vomit-seek additional immediate medical attention or go to the emergency department.     ED Prescriptions     Medication Sig Dispense  Auth. Provider   Simethicone  125 MG CAPS Take 1 capsule (125 mg total) by mouth 3 (three) times daily as needed. 28 capsule Jean Lacap E, PA-C      PDMP not reviewed this encounter.     [1]  Social History Tobacco Use   Smoking status: Never   Smokeless tobacco: Never  Vaping Use   Vaping status: Never Used  Substance Use Topics   Alcohol use: Not Currently    Comment: occasionally.   Drug use: No     Arlyss Leita BRAVO, PA-C 02/01/24 1142  "

## 2024-02-01 NOTE — ED Triage Notes (Signed)
 Pt c/o pain in lower abdomin and rectal that feels like gas. She began having symtpoms yesterday. States she is having regular BMs  She also has an abscess under right arm that is painful for 2-3 days. States she has line of fluid under arm

## 2024-02-06 ENCOUNTER — Ambulatory Visit: Payer: Medicaid Other | Admitting: Obstetrics and Gynecology

## 2024-02-07 ENCOUNTER — Ambulatory Visit: Admitting: Physician Assistant

## 2024-04-05 ENCOUNTER — Ambulatory Visit: Admitting: Obstetrics and Gynecology

## 2024-08-06 ENCOUNTER — Ambulatory Visit: Admitting: Physician Assistant
